# Patient Record
Sex: Female | Born: 1935 | Race: Black or African American | Hispanic: No | Smoking: Never smoker
Health system: Southern US, Community
[De-identification: ages and names within clinical notes are randomized; demographics above are authoritative.]

## PROBLEM LIST (undated history)

## (undated) DIAGNOSIS — H409 Unspecified glaucoma: Secondary | ICD-10-CM

## (undated) DIAGNOSIS — E785 Hyperlipidemia, unspecified: Secondary | ICD-10-CM

## (undated) DIAGNOSIS — D649 Anemia, unspecified: Secondary | ICD-10-CM

## (undated) DIAGNOSIS — I10 Essential (primary) hypertension: Secondary | ICD-10-CM

## (undated) DIAGNOSIS — F0280 Dementia in other diseases classified elsewhere without behavioral disturbance: Secondary | ICD-10-CM

## (undated) DIAGNOSIS — M48061 Spinal stenosis, lumbar region without neurogenic claudication: Secondary | ICD-10-CM

## (undated) DIAGNOSIS — S065XAA Traumatic subdural hemorrhage with loss of consciousness status unknown, initial encounter: Secondary | ICD-10-CM

## (undated) DIAGNOSIS — F329 Major depressive disorder, single episode, unspecified: Secondary | ICD-10-CM

## (undated) DIAGNOSIS — C50911 Malignant neoplasm of unspecified site of right female breast: Secondary | ICD-10-CM

## (undated) DIAGNOSIS — E079 Disorder of thyroid, unspecified: Secondary | ICD-10-CM

## (undated) DIAGNOSIS — E560 Deficiency of vitamin E: Secondary | ICD-10-CM

## (undated) DIAGNOSIS — F32A Depression, unspecified: Secondary | ICD-10-CM

## (undated) DIAGNOSIS — G309 Alzheimer's disease, unspecified: Secondary | ICD-10-CM

## (undated) DIAGNOSIS — M1990 Unspecified osteoarthritis, unspecified site: Secondary | ICD-10-CM

## (undated) DIAGNOSIS — E039 Hypothyroidism, unspecified: Secondary | ICD-10-CM

## (undated) DIAGNOSIS — S065X9A Traumatic subdural hemorrhage with loss of consciousness of unspecified duration, initial encounter: Secondary | ICD-10-CM

---

## 1999-12-02 ENCOUNTER — Ambulatory Visit (HOSPITAL_COMMUNITY): Admission: RE | Admit: 1999-12-02 | Discharge: 1999-12-02 | Payer: Self-pay | Admitting: Internal Medicine

## 1999-12-02 ENCOUNTER — Encounter: Payer: Self-pay | Admitting: Internal Medicine

## 2001-07-28 HISTORY — PX: MASTECTOMY: SHX3

## 2004-11-07 ENCOUNTER — Encounter: Admission: RE | Admit: 2004-11-07 | Discharge: 2004-11-07 | Payer: Self-pay | Admitting: Internal Medicine

## 2006-01-22 ENCOUNTER — Ambulatory Visit: Payer: Self-pay | Admitting: Oncology

## 2006-02-16 ENCOUNTER — Ambulatory Visit (HOSPITAL_COMMUNITY): Admission: RE | Admit: 2006-02-16 | Discharge: 2006-02-16 | Payer: Self-pay | Admitting: Oncology

## 2006-02-18 ENCOUNTER — Encounter: Admission: RE | Admit: 2006-02-18 | Discharge: 2006-02-18 | Payer: Self-pay | Admitting: Unknown Physician Specialty

## 2006-03-11 ENCOUNTER — Ambulatory Visit: Payer: Self-pay | Admitting: Oncology

## 2006-08-05 ENCOUNTER — Ambulatory Visit: Payer: Self-pay | Admitting: Oncology

## 2006-08-10 LAB — COMPREHENSIVE METABOLIC PANEL
ALT: 9 U/L (ref 0–35)
AST: 18 U/L (ref 0–37)
Albumin: 4 g/dL (ref 3.5–5.2)
Alkaline Phosphatase: 54 U/L (ref 39–117)
Calcium: 9.4 mg/dL (ref 8.4–10.5)
Glucose, Bld: 92 mg/dL (ref 70–99)
Potassium: 4 mEq/L (ref 3.5–5.3)
Sodium: 145 mEq/L (ref 135–145)

## 2006-08-10 LAB — CANCER ANTIGEN 27.29: CA 27.29: 15 U/mL (ref 0–39)

## 2006-08-10 LAB — CBC WITH DIFFERENTIAL/PLATELET
HCT: 42.8 % (ref 34.8–46.6)
HGB: 14 g/dL (ref 11.6–15.9)
LYMPH%: 21.3 % (ref 14.0–48.0)
MONO%: 10.3 % (ref 0.0–13.0)
NEUT#: 7 10*3/uL — ABNORMAL HIGH (ref 1.5–6.5)
WBC: 10.6 10*3/uL — ABNORMAL HIGH (ref 3.9–10.0)

## 2006-08-10 LAB — LACTATE DEHYDROGENASE: LDH: 188 U/L (ref 94–250)

## 2006-08-18 ENCOUNTER — Ambulatory Visit (HOSPITAL_COMMUNITY): Admission: RE | Admit: 2006-08-18 | Discharge: 2006-08-18 | Payer: Self-pay | Admitting: Oncology

## 2007-02-22 ENCOUNTER — Encounter: Admission: RE | Admit: 2007-02-22 | Discharge: 2007-02-22 | Payer: Self-pay | Admitting: Oncology

## 2007-02-23 ENCOUNTER — Ambulatory Visit (HOSPITAL_COMMUNITY): Admission: RE | Admit: 2007-02-23 | Discharge: 2007-02-23 | Payer: Self-pay | Admitting: Oncology

## 2007-02-25 ENCOUNTER — Encounter (INDEPENDENT_AMBULATORY_CARE_PROVIDER_SITE_OTHER): Payer: Self-pay | Admitting: Diagnostic Radiology

## 2007-02-25 ENCOUNTER — Encounter: Admission: RE | Admit: 2007-02-25 | Discharge: 2007-02-25 | Payer: Self-pay | Admitting: Oncology

## 2007-08-09 ENCOUNTER — Ambulatory Visit: Payer: Self-pay | Admitting: Oncology

## 2007-09-20 LAB — CBC WITH DIFFERENTIAL/PLATELET
BASO%: 0.9 % (ref 0.0–2.0)
MCHC: 34 g/dL (ref 32.0–36.0)
MONO%: 8.6 % (ref 0.0–13.0)
Platelets: 120 10*3/uL — ABNORMAL LOW (ref 145–400)
RBC: 4.53 10*6/uL (ref 3.70–5.32)
RDW: 14.3 % (ref 11.3–14.5)

## 2007-09-21 LAB — COMPREHENSIVE METABOLIC PANEL
CO2: 26 mEq/L (ref 19–32)
Chloride: 109 mEq/L (ref 96–112)
Creatinine, Ser: 0.65 mg/dL (ref 0.40–1.20)
Glucose, Bld: 101 mg/dL — ABNORMAL HIGH (ref 70–99)
Potassium: 3.9 mEq/L (ref 3.5–5.3)
Sodium: 144 mEq/L (ref 135–145)
Total Bilirubin: 0.6 mg/dL (ref 0.3–1.2)
Total Protein: 7.3 g/dL (ref 6.0–8.3)

## 2007-09-21 LAB — CANCER ANTIGEN 27.29: CA 27.29: 11 U/mL (ref 0–39)

## 2007-09-21 LAB — VITAMIN D 25 HYDROXY (VIT D DEFICIENCY, FRACTURES): Vit D, 25-Hydroxy: 19 ng/mL — ABNORMAL LOW (ref 30–89)

## 2007-09-21 LAB — LACTATE DEHYDROGENASE: LDH: 206 U/L (ref 94–250)

## 2008-09-11 ENCOUNTER — Ambulatory Visit: Payer: Self-pay | Admitting: Oncology

## 2010-08-18 ENCOUNTER — Encounter: Payer: Self-pay | Admitting: Oncology

## 2010-08-18 ENCOUNTER — Encounter: Payer: Self-pay | Admitting: Internal Medicine

## 2010-08-19 ENCOUNTER — Encounter: Payer: Self-pay | Admitting: Oncology

## 2011-05-12 LAB — CREATININE, SERUM
Creatinine, Ser: 0.71
GFR calc non Af Amer: >60

## 2015-01-09 ENCOUNTER — Emergency Department (HOSPITAL_COMMUNITY): Payer: Medicare Other

## 2015-01-09 ENCOUNTER — Encounter (HOSPITAL_COMMUNITY): Payer: Self-pay | Admitting: *Deleted

## 2015-01-09 ENCOUNTER — Inpatient Hospital Stay (HOSPITAL_COMMUNITY)
Admission: EM | Admit: 2015-01-09 | Discharge: 2015-01-16 | DRG: 469 | Disposition: A | Discharge status: Skilled Nursing Facility | Payer: Medicare Other | Attending: Internal Medicine | Admitting: Internal Medicine

## 2015-01-09 DIAGNOSIS — S72009A Fracture of unspecified part of neck of unspecified femur, initial encounter for closed fracture: Secondary | ICD-10-CM | POA: Diagnosis present

## 2015-01-09 DIAGNOSIS — S72002A Fracture of unspecified part of neck of left femur, initial encounter for closed fracture: Secondary | ICD-10-CM | POA: Diagnosis not present

## 2015-01-09 DIAGNOSIS — S72002D Fracture of unspecified part of neck of left femur, subsequent encounter for closed fracture with routine healing: Secondary | ICD-10-CM | POA: Diagnosis not present

## 2015-01-09 DIAGNOSIS — Z66 Do not resuscitate: Secondary | ICD-10-CM | POA: Diagnosis present

## 2015-01-09 DIAGNOSIS — E039 Hypothyroidism, unspecified: Secondary | ICD-10-CM | POA: Diagnosis present

## 2015-01-09 DIAGNOSIS — Z419 Encounter for procedure for purposes other than remedying health state, unspecified: Secondary | ICD-10-CM

## 2015-01-09 DIAGNOSIS — G309 Alzheimer's disease, unspecified: Secondary | ICD-10-CM | POA: Diagnosis present

## 2015-01-09 DIAGNOSIS — M1990 Unspecified osteoarthritis, unspecified site: Secondary | ICD-10-CM | POA: Diagnosis present

## 2015-01-09 DIAGNOSIS — R5383 Other fatigue: Secondary | ICD-10-CM | POA: Diagnosis not present

## 2015-01-09 DIAGNOSIS — F0280 Dementia in other diseases classified elsewhere without behavioral disturbance: Secondary | ICD-10-CM | POA: Diagnosis present

## 2015-01-09 DIAGNOSIS — Z7982 Long term (current) use of aspirin: Secondary | ICD-10-CM | POA: Diagnosis not present

## 2015-01-09 DIAGNOSIS — S72012A Unspecified intracapsular fracture of left femur, initial encounter for closed fracture: Secondary | ICD-10-CM | POA: Diagnosis present

## 2015-01-09 DIAGNOSIS — M25512 Pain in left shoulder: Secondary | ICD-10-CM | POA: Diagnosis present

## 2015-01-09 DIAGNOSIS — R32 Unspecified urinary incontinence: Secondary | ICD-10-CM | POA: Diagnosis present

## 2015-01-09 DIAGNOSIS — W19XXXA Unspecified fall, initial encounter: Secondary | ICD-10-CM | POA: Diagnosis present

## 2015-01-09 DIAGNOSIS — S42302A Unspecified fracture of shaft of humerus, left arm, initial encounter for closed fracture: Principal | ICD-10-CM | POA: Diagnosis present

## 2015-01-09 DIAGNOSIS — I440 Atrioventricular block, first degree: Secondary | ICD-10-CM | POA: Diagnosis present

## 2015-01-09 DIAGNOSIS — D62 Acute posthemorrhagic anemia: Secondary | ICD-10-CM | POA: Insufficient documentation

## 2015-01-09 DIAGNOSIS — I10 Essential (primary) hypertension: Secondary | ICD-10-CM | POA: Diagnosis present

## 2015-01-09 DIAGNOSIS — S72001D Fracture of unspecified part of neck of right femur, subsequent encounter for closed fracture with routine healing: Secondary | ICD-10-CM | POA: Diagnosis not present

## 2015-01-09 DIAGNOSIS — R52 Pain, unspecified: Secondary | ICD-10-CM

## 2015-01-09 DIAGNOSIS — S42309A Unspecified fracture of shaft of humerus, unspecified arm, initial encounter for closed fracture: Secondary | ICD-10-CM | POA: Insufficient documentation

## 2015-01-09 DIAGNOSIS — M25559 Pain in unspecified hip: Secondary | ICD-10-CM

## 2015-01-09 DIAGNOSIS — S42302D Unspecified fracture of shaft of humerus, left arm, subsequent encounter for fracture with routine healing: Secondary | ICD-10-CM | POA: Diagnosis not present

## 2015-01-09 HISTORY — DX: Dementia in other diseases classified elsewhere, unspecified severity, without behavioral disturbance, psychotic disturbance, mood disturbance, and anxiety: F02.80

## 2015-01-09 HISTORY — DX: Essential (primary) hypertension: I10

## 2015-01-09 HISTORY — DX: Hypothyroidism, unspecified: E03.9

## 2015-01-09 HISTORY — DX: Malignant neoplasm of unspecified site of right female breast: C50.911

## 2015-01-09 HISTORY — DX: Unspecified osteoarthritis, unspecified site: M19.90

## 2015-01-09 HISTORY — DX: Disorder of thyroid, unspecified: E07.9

## 2015-01-09 HISTORY — DX: Alzheimer's disease, unspecified: G30.9

## 2015-01-09 LAB — CBC WITH DIFFERENTIAL/PLATELET
Basophils Absolute: 0 10*3/uL (ref 0.0–0.1)
Basophils Relative: 0 % (ref 0–1)
Eosinophils Absolute: 0 10*3/uL (ref 0.0–0.7)
Eosinophils Relative: 0 % (ref 0–5)
HCT: 45.8 % (ref 36.0–46.0)
Hemoglobin: 15.4 g/dL — ABNORMAL HIGH (ref 12.0–15.0)
Lymphocytes Relative: 13 % (ref 12–46)
Lymphs Abs: 1.1 10*3/uL (ref 0.7–4.0)
MCH: 30.9 pg (ref 26.0–34.0)
MCHC: 33.6 g/dL (ref 30.0–36.0)
MCV: 92 fL (ref 78.0–100.0)
Monocytes Absolute: 0.6 10*3/uL (ref 0.1–1.0)
Monocytes Relative: 6 % (ref 3–12)
Neutro Abs: 7.3 10*3/uL (ref 1.7–7.7)
Neutrophils Relative %: 81 % — ABNORMAL HIGH (ref 43–77)
Platelets: 134 10*3/uL — ABNORMAL LOW (ref 150–400)
RBC: 4.98 MIL/uL (ref 3.87–5.11)
RDW: 14.5 % (ref 11.5–15.5)
WBC: 9.1 10*3/uL (ref 4.0–10.5)

## 2015-01-09 LAB — ABO/RH: ABO/RH(D): O POS

## 2015-01-09 LAB — URINALYSIS, ROUTINE W REFLEX MICROSCOPIC
Bilirubin Urine: NEGATIVE
Glucose, UA: NEGATIVE mg/dL
Ketones, ur: 15 mg/dL — AB
Leukocytes, UA: NEGATIVE
Nitrite: NEGATIVE
Protein, ur: NEGATIVE mg/dL
Specific Gravity, Urine: 1.007 (ref 1.005–1.030)
Urobilinogen, UA: 0.2 mg/dL (ref 0.0–1.0)
pH: 7.5 (ref 5.0–8.0)

## 2015-01-09 LAB — BASIC METABOLIC PANEL
Anion gap: 9 (ref 5–15)
BUN: 10 mg/dL (ref 6–20)
CO2: 27 mmol/L (ref 22–32)
Calcium: 9.3 mg/dL (ref 8.9–10.3)
Chloride: 106 mmol/L (ref 101–111)
Creatinine, Ser: 0.52 mg/dL (ref 0.44–1.00)
GFR calc Af Amer: >60 mL/min (ref >60)
GFR calc non Af Amer: >60 mL/min (ref >60)
Glucose, Bld: 126 mg/dL — ABNORMAL HIGH (ref 65–99)
Potassium: 3.8 mmol/L (ref 3.5–5.1)
Sodium: 142 mmol/L (ref 135–145)

## 2015-01-09 LAB — TYPE AND SCREEN
ABO/RH(D): O POS
Antibody Screen: NEGATIVE

## 2015-01-09 LAB — PROTIME-INR
INR: 1.11 (ref 0.00–1.49)
Prothrombin Time: 14.5 seconds (ref 11.6–15.2)

## 2015-01-09 LAB — URINE MICROSCOPIC-ADD ON

## 2015-01-09 LAB — TSH: TSH: 2.005 u[IU]/mL (ref 0.350–4.500)

## 2015-01-09 LAB — BRAIN NATRIURETIC PEPTIDE: B Natriuretic Peptide: 251.9 pg/mL — ABNORMAL HIGH (ref 0.0–100.0)

## 2015-01-09 LAB — T4, FREE: FREE T4: 0.95 ng/dL (ref 0.61–1.12)

## 2015-01-09 MED ORDER — ESCITALOPRAM OXALATE 10 MG PO TABS
10.0000 mg | ORAL_TABLET | Freq: Every day | ORAL | Status: DC
  Administered 2015-01-14 – 2015-01-16 (×3): 10 mg via ORAL
  Filled 2015-01-09 (×7): qty 1

## 2015-01-09 MED ORDER — FENTANYL CITRATE (PF) 100 MCG/2ML IJ SOLN
50.0000 ug | INTRAMUSCULAR | Status: DC | PRN
  Administered 2015-01-09 (×2): 50 ug via INTRAVENOUS
  Filled 2015-01-09 (×2): qty 2

## 2015-01-09 MED ORDER — HYDROCODONE-ACETAMINOPHEN 5-325 MG PO TABS: 1.0000 | ORAL_TABLET | Freq: Four times a day (QID) | ORAL | Status: DC | PRN

## 2015-01-09 MED ORDER — TIMOLOL HEMIHYDRATE 0.5 % OP SOLN: 1.0000 [drp] | Freq: Every day | OPHTHALMIC | Status: DC

## 2015-01-09 MED ORDER — MORPHINE SULFATE 2 MG/ML IJ SOLN
0.5000 mg | INTRAMUSCULAR | Status: DC | PRN
  Administered 2015-01-09 – 2015-01-10 (×2): 0.5 mg via INTRAVENOUS
  Filled 2015-01-09 (×2): qty 1

## 2015-01-09 MED ORDER — LEVOTHYROXINE SODIUM 50 MCG PO TABS
50.0000 ug | ORAL_TABLET | Freq: Every day | ORAL | Status: DC
  Administered 2015-01-13 – 2015-01-16 (×4): 50 ug via ORAL
  Filled 2015-01-09 (×8): qty 1

## 2015-01-09 MED ORDER — HYDRALAZINE HCL 20 MG/ML IJ SOLN
5.0000 mg | Freq: Four times a day (QID) | INTRAMUSCULAR | Status: DC | PRN
  Administered 2015-01-14: 5 mg via INTRAVENOUS
  Filled 2015-01-09: qty 1

## 2015-01-09 MED ORDER — POLYETHYLENE GLYCOL 3350 17 G PO PACK
17.0000 g | PACK | Freq: Every day | ORAL | Status: DC | PRN
  Filled 2015-01-09: qty 1

## 2015-01-09 MED ORDER — TIMOLOL MALEATE 0.5 % OP SOLN
1.0000 [drp] | Freq: Every day | OPHTHALMIC | Status: DC
  Administered 2015-01-09 – 2015-01-16 (×7): 1 [drp] via OPHTHALMIC
  Filled 2015-01-09 (×3): qty 5

## 2015-01-09 MED ORDER — FUROSEMIDE 10 MG/ML IJ SOLN
20.0000 mg | Freq: Once | INTRAMUSCULAR | Status: AC
  Administered 2015-01-09: 20 mg via INTRAVENOUS
  Filled 2015-01-09: qty 2

## 2015-01-09 MED ORDER — LISINOPRIL 10 MG PO TABS
10.0000 mg | ORAL_TABLET | Freq: Every day | ORAL | Status: DC
  Administered 2015-01-14 – 2015-01-16 (×3): 10 mg via ORAL
  Filled 2015-01-09 (×7): qty 1

## 2015-01-09 MED ORDER — CETYLPYRIDINIUM CHLORIDE 0.05 % MT LIQD
7.0000 mL | Freq: Two times a day (BID) | OROMUCOSAL | Status: DC
  Administered 2015-01-09 – 2015-01-16 (×10): 7 mL via OROMUCOSAL

## 2015-01-09 MED ORDER — ATORVASTATIN CALCIUM 10 MG PO TABS
20.0000 mg | ORAL_TABLET | Freq: Every day | ORAL | Status: DC
Start: 2015-01-09 — End: 2015-01-16
  Administered 2015-01-13 – 2015-01-15 (×3): 20 mg via ORAL
  Filled 2015-01-09: qty 2
  Filled 2015-01-09 (×2): qty 1
  Filled 2015-01-09: qty 2
  Filled 2015-01-09 (×4): qty 1

## 2015-01-09 MED ORDER — FENTANYL CITRATE (PF) 100 MCG/2ML IJ SOLN
50.0000 ug | INTRAMUSCULAR | Status: AC | PRN
  Administered 2015-01-09 (×2): 50 ug via INTRAVENOUS
  Filled 2015-01-09 (×2): qty 2

## 2015-01-09 MED ORDER — ONDANSETRON HCL 4 MG/2ML IJ SOLN
4.0000 mg | Freq: Once | INTRAMUSCULAR | Status: AC
  Administered 2015-01-09: 4 mg via INTRAVENOUS
  Filled 2015-01-09: qty 2

## 2015-01-09 MED ORDER — FENTANYL CITRATE (PF) 100 MCG/2ML IJ SOLN
25.0000 ug | Freq: Once | INTRAMUSCULAR | Status: AC
Start: 2015-01-09 — End: 2015-01-09
  Administered 2015-01-09: 25 ug via INTRAVENOUS
  Filled 2015-01-09: qty 2

## 2015-01-09 NOTE — Progress Notes (Signed)
Orthopedic Tech Progress Note Patient Details:  Amber Fowler [birth year] [id number] Patient is unable to use ohf Patient ID: Amber Fowler, female   DOB: 1918-08-18, 81 y.o.   MRN: [id number]   Braulio Bosch 01/09/2015, 8:29 PM

## 2015-01-09 NOTE — H&P (Signed)
History and Physical  Amber Fowler YKD:[id number] DOB: [birth year] DOA: 01/09/2015  Referring physician: Okey Regal, ER PA PCP: No primary care provider on file. patient previously from out of town and recently brought to [facility] by her son  Chief Complaint: Fall and pain  HPI: Amber Fowler is a 81 y.o. female  With past medical history of hypothyroidism, hypertension and Alzheimer's disease who was previously from out of town and has been recently moved up here to [place] under the care of her son who is also a Engineer, drilling.  Patient was brought in from independent living by EMS after she was found by staff. Patient had an unwitnessed fall and was down for unknown length of time. Patient was found to have some twisting of her left arm as well as inward rotation and shortening of her left leg. She herself has intermittent confusion and is not able to relate events of her fall. Patient was given medication for pain. X-rays done noted a left humerus fracture and femur fracture. Orthopedics was consulted. Labs were done which were unremarkable although patient's blood pressure was noted to be markedly elevated in the 200s. It was reported she did take her blood pressure medicine this morning. Hospitalists were called for further evaluation and admission.   Review of Systems:  Patient seen after arrival to floor. Currently she is somnolent after receiving medication for pain. I'm unable to get any kind of review of systems from her..  Past Medical History  Diagnosis Date  . Hypertension   . Thyroid disease   . Hypothyroid   . Osteoarthritis   . Cancer of right breast   . Alzheimer disease     /notes 01/09/2015   Past Surgical History  Procedure Laterality Date  . Mastectomy Right 2003    [name] 10/01/2010   Social History:  reports that she does not drink alcohol or use illicit drugs. Her tobacco history is not on file. unable to clarify from patient Patient lives at an  independent living facility and is able to mostly participate in activities of daily living although it's been reported she didn't having increased falls as of late  No Known Allergies  Family history: Due to to patient's somnolence, unable to obtain  Prior to Admission medications   Medication Sig Start Date End Date Taking? Authorizing Provider  aspirin 325 MG tablet Take 325 mg by mouth daily after lunch.   Yes Historical Provider, MD  atorvastatin (LIPITOR) 20 MG tablet Take 20 mg by mouth at bedtime.   Yes Historical Provider, MD  cholecalciferol (VITAMIN D) 1000 UNITS tablet Take 1,000 Units by mouth at bedtime.   Yes Historical Provider, MD  escitalopram (LEXAPRO) 10 MG tablet Take 10 mg by mouth daily after lunch.   Yes Historical Provider, MD  levothyroxine (SYNTHROID, LEVOTHROID) 50 MCG tablet Take 50 mcg by mouth every morning.   Yes Historical Provider, MD  lisinopril (PRINIVIL,ZESTRIL) 10 MG tablet Take 10 mg by mouth daily after lunch.   Yes Historical Provider, MD  Omega-3 Fatty Acids (FISH OIL) 1000 MG CAPS Take 1,000 mg by mouth at bedtime.   Yes Historical Provider, MD  timolol (BETIMOL) 0.5 % ophthalmic solution Place 1 drop into both eyes daily after lunch.   Yes Historical Provider, MD    Physical Exam: BP 218/79 mmHg  Pulse 61  Temp(Src) 98.6 F (37 C) (Oral)  Resp 18  Ht   Wt 51.3 kg (113 lb 1.5 oz)  SpO2 100%  General:  Somnolent, no acute distress Eyes: Sclerae appear nonicteric ENT: Normocephalic, atraumatic, mucous membranes are dry Neck: No JVD Cardiovascular: Regular rate and rhythm, O9-G2, 2/6 systolic ejection murmur Respiratory: Clear to auscultation bilaterally Abdomen: Soft, nontender, nondistended, positive bowel sounds Skin: No skin breaks, tears or lesions Musculoskeletal: No clubbing or cyanosis, trace edema Psychiatric: Patient is appropriate, no evidence of psychoses Neurologic: No focal deficits           Labs on Admission:  Basic  Metabolic Panel:  Recent Labs Lab 01/09/15 1218  NA 142  K 3.8  CL 106  CO2 27  GLUCOSE 126*  BUN 10  CREATININE 0.52  CALCIUM 9.3   Liver Function Tests: No results for input(s): AST, ALT, ALKPHOS, BILITOT, PROT, ALBUMIN in the last 168 hours. No results for input(s): LIPASE, AMYLASE in the last 168 hours. No results for input(s): AMMONIA in the last 168 hours. CBC:  Recent Labs Lab 01/09/15 1218  WBC 9.1  NEUTROABS 7.3  HGB 15.4*  HCT 45.8  MCV 92.0  PLT 134*   Cardiac Enzymes: No results for input(s): CKTOTAL, CKMB, CKMBINDEX, TROPONINI in the last 168 hours.  BNP (last 3 results) No results for input(s): BNP in the last 8760 hours.  ProBNP (last 3 results) No results for input(s): PROBNP in the last 8760 hours.  CBG: No results for input(s): GLUCAP in the last 168 hours.  Radiological Exams on Admission: Dg Chest 1 View  01/09/2015   CLINICAL DATA:  Pain following fall  EXAM: CHEST  1 VIEW  COMPARISON:  None.  FINDINGS: There is no edema or consolidation. Heart is slightly enlarged with pulmonary vascularity within normal limits. There is atherosclerotic change in the aorta. No adenopathy. Patient is status post right mastectomy. There are surgical clips the right axillary region. Bones are somewhat osteoporotic. No pneumothorax. No rib fracture evident. There is arthropathy in each shoulder.  IMPRESSION: Mild cardiac enlargement.  No edema or consolidation.   Electronically Signed   By: Lowella Grip III M.D.   On: 01/09/2015 13:52   Ct Head Wo Contrast  01/09/2015   CLINICAL DATA:  Unwitnessed fall.  Pain.  EXAM: CT HEAD WITHOUT CONTRAST  CT CERVICAL SPINE WITHOUT CONTRAST  TECHNIQUE: Multidetector CT imaging of the head and cervical spine was performed following the standard protocol without intravenous contrast. Multiplanar CT image reconstructions of the cervical spine were also generated.  COMPARISON:  None.  FINDINGS: CT HEAD FINDINGS  There is no acute  intracranial hemorrhage, acute infarction, or worrisome intracranial mass lesion. The patient does have a 15 mm calcified meningioma on the inner toe mobile the left side of the frontal bone. There is no edema in the adjacent brain tissue.  The patient has benign basal ganglia calcifications as well as white matter calcification in the left frontal lobe. Diffuse cerebral cortical and cerebellar atrophy, most prominent in the frontal and temporal lobes. No acute osseous abnormality.  CT CERVICAL SPINE FINDINGS  Congenital incomplete posterior arch of C1. No fracture or acute subluxation. No prevertebral soft tissue swelling. Severe degenerative disc disease at C3-4 and C4-5. Auto fusion of the C5 and C6 vertebra. Impending auto fusion at C6-7.  Moderately severe facet arthritis at C2-3 and C3-4 on the left. Less severe facet arthritis from C2-3 through T2-3 on the right.  2 mm anterolisthesis of C7 on T1. Disc space narrowing at T1-2 and T2-3.  IMPRESSION: 1. No acute intracranial abnormality. Atrophy. Calcified meningioma on the left frontal bone. 2.  No acute abnormality of the cervical spine. Multilevel degenerative disc and joint disease.   Electronically Signed   By: Lorriane Shire M.D.   On: 01/09/2015 13:46   Ct Cervical Spine Wo Contrast  01/09/2015   CLINICAL DATA:  Unwitnessed fall.  Pain.  EXAM: CT HEAD WITHOUT CONTRAST  CT CERVICAL SPINE WITHOUT CONTRAST  TECHNIQUE: Multidetector CT imaging of the head and cervical spine was performed following the standard protocol without intravenous contrast. Multiplanar CT image reconstructions of the cervical spine were also generated.  COMPARISON:  None.  FINDINGS: CT HEAD FINDINGS  There is no acute intracranial hemorrhage, acute infarction, or worrisome intracranial mass lesion. The patient does have a 15 mm calcified meningioma on the inner toe mobile the left side of the frontal bone. There is no edema in the adjacent brain tissue.  The patient has benign  basal ganglia calcifications as well as white matter calcification in the left frontal lobe. Diffuse cerebral cortical and cerebellar atrophy, most prominent in the frontal and temporal lobes. No acute osseous abnormality.  CT CERVICAL SPINE FINDINGS  Congenital incomplete posterior arch of C1. No fracture or acute subluxation. No prevertebral soft tissue swelling. Severe degenerative disc disease at C3-4 and C4-5. Auto fusion of the C5 and C6 vertebra. Impending auto fusion at C6-7.  Moderately severe facet arthritis at C2-3 and C3-4 on the left. Less severe facet arthritis from C2-3 through T2-3 on the right.  2 mm anterolisthesis of C7 on T1. Disc space narrowing at T1-2 and T2-3.  IMPRESSION: 1. No acute intracranial abnormality. Atrophy. Calcified meningioma on the left frontal bone. 2. No acute abnormality of the cervical spine. Multilevel degenerative disc and joint disease.   Electronically Signed   By: Lorriane Shire M.D.   On: 01/09/2015 13:46   Dg Shoulder Left  01/09/2015   CLINICAL DATA:  Unwitnessed fall, LEFT arm deformity  EXAM: LEFT SHOULDER - 2+ VIEW  COMPARISON:  None  FINDINGS: Marked osseous demineralization.  Mid diaphyseal fracture LEFT humerus with apex medial angulation, medial displacement, and overriding.  Advanced LEFT glenohumeral degenerative changes and probable LEFT chronic rotator cuff tear.  Level alignment inadequately assessed on this exam.  Visualized ribs intact.  No glenohumeral dislocation or AC joint malalignment identified.  IMPRESSION: Displaced angulated and overriding mid diaphyseal fracture LEFT humerus.  Degenerative changes LEFT shoulder.  Marked osseous demineralization.   Electronically Signed   By: Lavonia Dana M.D.   On: 01/09/2015 13:55   Dg Hip Unilat With Pelvis 2-3 Views Left  01/09/2015   CLINICAL DATA:  Unwitnessed fall with left leg deformity  EXAM: LEFT HIP (WITH PELVIS) 2-3 VIEWS  COMPARISON:  None.  FINDINGS: Acute left femoral neck fracture,  subcapital. There is displacement with upward migration of the distal femur. No dislocation. No notable degenerative changes to the left acetabulum. No evidence of pelvic ring fracture.  1.2 cm ovoid sclerotic focus in the intertrochanteric right femur. Noted history of breast cancer, but morphology favors a bone island.  IMPRESSION: 1. Displaced subcapital left femoral neck fracture. 2. Sclerotic focus in the proximal right femur favors a bone island.   Electronically Signed   By: Monte Fantasia M.D.   On: 01/09/2015 13:57   Dg Femur Min 2 Views Left  01/09/2015   CLINICAL DATA:  Pain following fall  EXAM: LEFT FEMUR 2 VIEWS  COMPARISON:  None.  FINDINGS: Frontal and lateral views were obtained. There is a subcapital femoral neck fracture on the left with  varus angulation at the fracture site. There is mild impaction of the inferior femoral neck along the femoral head in the region of the fracture. No other fracture. No dislocation. There is moderate osteoarthritic change in the hip and knee joints. There is moderate arterial vascular calcification. No apparent knee joint effusion.  IMPRESSION: Subcapital femoral neck fracture on the left with varus angulation at the fracture site and mild impaction.   Electronically Signed   By: Lowella Grip III M.D.   On: 01/09/2015 13:53    EKG: Independently reviewed. Normal sinus rhythm with PVCs, prolonged PR 423, borderline prolonged QT, probable LVH  Assessment/Plan Present on Admission:  . Hypertension: Markedly elevated. Patient's son did confirm that she did take her lisinopril this morning. Even with pain, systolic staying in the 606T. Son states that she's had some difficulty with control of her blood pressure. I suspect she may have some volume overload. Of order BNP and given one dose of Lasix. If she has a large amount of volume output and/or BNP elevated, which echocardiogram. Patient does not have a previous diagnosis of congestive heart failure.    . Hypothyroid: Continue Synthroid. Discussed with son who had gotten recent TSH which noted mild elevation. Recheck TSH plus free T3/T4.  . Alzheimer disease: For now try to minimize pain medications and other things that can increase confusion. We'll leave her as nothing by mouth although will start diet if the plan is for no surgery tomorrow. Concerned about high aspiration risk.  . Left displaced femoral neck fracture: Left message with orthopedic surgery, for medical standpoint she is cleared although will work on improving her blood pressure. She may have some underlying undiagnosed cardiac disease, but with no previously diagnosed heart disease and high mortality given fracture and advanced age, she would be a even more risk if she did not undergo surgery soon as possible. Prolonged PR interval on EKG: Repeat in the morning  Consultants: Orthopedics, Dr. Marlou Sa  Code Status: DO NOT RESUSCITATE as confirmed by son  Family Communication: Spoke with son by phone   Disposition Plan: Inpatient then likely will need skilled nursing  Time spent: 40 minutes  [facility] Pager [phone]

## 2015-01-09 NOTE — ED Notes (Signed)
Spoke with Sophronia Simas tech re: sling vs. Splint placement

## 2015-01-09 NOTE — ED Notes (Signed)
Accompanied pt to CT & Xray, pt c/o nausea, Hedges, PA notified, pt rcvd Zofran 4mg  & 25 mcg Fentanyl while in Radiology

## 2015-01-09 NOTE — ED Notes (Signed)
Attempted report 

## 2015-01-09 NOTE — Progress Notes (Signed)
Pt seen and examined - left hip and left humerus fracture Will discuss with her son Dr Carlis Abbott No surgery tonight Full consult to follow

## 2015-01-09 NOTE — ED Notes (Signed)
Son updated on pt status & speaking with Hedges, PA on the phone

## 2015-01-09 NOTE — Progress Notes (Signed)
Orthopedic Tech Progress Note Patient Details:  Amber Fowler June 16, 1919 [id number]  Ortho Devices Type of Ortho Device: Arm sling Ortho Device/Splint Location: LUE Ortho Device/Splint Interventions: Ordered, Application   Braulio Bosch 01/09/2015, 4:17 PM

## 2015-01-09 NOTE — Progress Notes (Signed)
Orthopedic Tech Progress Note Patient Details:  Amber Fowler Nov 21, 1918 [id number] Pt. unable to use OHF with trapeze due to upper extremity fx. Patient ID: [name], female   DOB: 1918/10/18, 81 y.o.   MRN: [id number]   Darrol Poke 01/09/2015, 8:51 PM

## 2015-01-09 NOTE — Progress Notes (Signed)
Notified MD that patient has arrived to nursing unit and BP was elevated.

## 2015-01-09 NOTE — ED Notes (Signed)
Pt in from [facility] independent living via [facility], per report pt had unwitnessed fall, unknown LOC, pt reported to have L arm obvious deformity, pt has inward rotation & shortening of L leg, pt hx of Alz, pt at neuro baseline-alert to self, pt follows commands, speaks in complete sentences, pt has intermittent confusion at baseline, pt has L arm immobilizer in place, pt did not tolerate c collar, neck secured with towel & tape

## 2015-01-09 NOTE — ED Provider Notes (Signed)
CSN: [id number]     Arrival date & time 01/09/15  1137 History   First MD Initiated Contact with Patient 01/09/15 1145     Chief Complaint  Patient presents with  . Fall  . Hip Pain  . Arm Pain    HPI   81 year old female presenting status post fall today. He was brought to the ED by EMS after she was found from an unwitnessed fall, unknown loss of consciousness. EMS reports left arm obvious deformity, external rotation and shortening of the left extremity. Patient has a history Alzheimer's, nursing staff reports that she is at her neurological baseline, able to follow commands, speaks in complete sentences. Upon evaluation patient was incontinent on the bed, she was alert and oriented to being in the hospital and the event. She reports that she remembers the fall, but is uncertain what caused it. She reports left shoulder and arm pain and left hip pain at the time of evaluation. When asked where she hurts the most she reports "everywhere". Nursing staff reports that they spoke with the son who is her power of attorney.   Past Medical History  Diagnosis Date  . Hypertension   . Thyroid disease   . Hypothyroid   . Osteoarthritis    History reviewed. No pertinent past surgical history. No family history on file. History  Substance Use Topics  . Smoking status: Not on file  . Smokeless tobacco: Not on file  . Alcohol Use: No   OB History    No data available     Review of Systems  All other systems reviewed and are negative.   Allergies  Review of patient's allergies indicates no known allergies.  Home Medications   Prior to Admission medications   Medication Sig Start Date End Date Taking? Authorizing Provider  aspirin 325 MG tablet Take 325 mg by mouth daily after lunch.   Yes Historical Provider, MD  atorvastatin (LIPITOR) 20 MG tablet Take 20 mg by mouth at bedtime.   Yes Historical Provider, MD  cholecalciferol (VITAMIN D) 1000 UNITS tablet Take 1,000 Units by mouth at  bedtime.   Yes Historical Provider, MD  escitalopram (LEXAPRO) 10 MG tablet Take 10 mg by mouth daily after lunch.   Yes Historical Provider, MD  levothyroxine (SYNTHROID, LEVOTHROID) 50 MCG tablet Take 50 mcg by mouth every morning.   Yes Historical Provider, MD  lisinopril (PRINIVIL,ZESTRIL) 10 MG tablet Take 10 mg by mouth daily after lunch.   Yes Historical Provider, MD  Omega-3 Fatty Acids (FISH OIL) 1000 MG CAPS Take 1,000 mg by mouth at bedtime.   Yes Historical Provider, MD  timolol (BETIMOL) 0.5 % ophthalmic solution Place 1 drop into both eyes daily after lunch.   Yes Historical Provider, MD   BP 205/70 mmHg  Pulse 57  Temp(Src) 97.7 F (36.5 C) (Oral)  Resp 19  SpO2 98% Physical Exam  Constitutional: She is oriented to person, place, and time. She appears well-developed and well-nourished.  HENT:  Head: Normocephalic and atraumatic.  Eyes: Conjunctivae are normal. Pupils are equal, round, and reactive to light. Right eye exhibits no discharge. Left eye exhibits no discharge. No scleral icterus.  Neck: Normal range of motion. No JVD present. No tracheal deviation present.  Pulmonary/Chest: Effort normal. No stridor.  Musculoskeletal:  Obvious deformity of the left humerus, radial pulse strong, sensation, grip strength intact distally.  Left hip painful to palpation, no signs of soft tissue injury. Left extremity shortened and externally rotated  C-spine  is mildly tender to palpation. T and L-spine nontender to palpation, no pain with AP lateral chest compression. Left hip nontender palpation left extremity nontender right extremity nontender, abdomen nontender  Neurological: She is alert and oriented to person, place, and time. Coordination normal.  Psychiatric: She has a normal mood and affect. Her behavior is normal. Judgment and thought content normal.  Nursing note and vitals reviewed.   ED Course  Procedures (including critical care time) Labs Review Labs Reviewed   BASIC METABOLIC PANEL - Abnormal; Notable for the following:    Glucose, Bld 126 (*)    All other components within normal limits  CBC WITH DIFFERENTIAL/PLATELET - Abnormal; Notable for the following:    Hemoglobin 15.4 (*)    Platelets 134 (*)    Neutrophils Relative % 81 (*)    All other components within normal limits  URINALYSIS, ROUTINE W REFLEX MICROSCOPIC (NOT AT [facility]) - Abnormal; Notable for the following:    Color, Urine STRAW (*)    Hgb urine dipstick SMALL (*)    Ketones, ur 15 (*)    All other components within normal limits  PROTIME-INR  URINE MICROSCOPIC-ADD ON  BRAIN NATRIURETIC PEPTIDE  TYPE AND SCREEN  ABO/RH    Imaging Review Dg Chest 1 View  01/09/2015   CLINICAL DATA:  Pain following fall  EXAM: CHEST  1 VIEW  COMPARISON:  None.  FINDINGS: There is no edema or consolidation. Heart is slightly enlarged with pulmonary vascularity within normal limits. There is atherosclerotic change in the aorta. No adenopathy. Patient is status post right mastectomy. There are surgical clips the right axillary region. Bones are somewhat osteoporotic. No pneumothorax. No rib fracture evident. There is arthropathy in each shoulder.  IMPRESSION: Mild cardiac enlargement.  No edema or consolidation.   Electronically Signed   By: Lowella Grip III M.D.   On: 01/09/2015 13:52   Ct Head Wo Contrast  01/09/2015   CLINICAL DATA:  Unwitnessed fall.  Pain.  EXAM: CT HEAD WITHOUT CONTRAST  CT CERVICAL SPINE WITHOUT CONTRAST  TECHNIQUE: Multidetector CT imaging of the head and cervical spine was performed following the standard protocol without intravenous contrast. Multiplanar CT image reconstructions of the cervical spine were also generated.  COMPARISON:  None.  FINDINGS: CT HEAD FINDINGS  There is no acute intracranial hemorrhage, acute infarction, or worrisome intracranial mass lesion. The patient does have a 15 mm calcified meningioma on the inner toe mobile the left side of the frontal  bone. There is no edema in the adjacent brain tissue.  The patient has benign basal ganglia calcifications as well as white matter calcification in the left frontal lobe. Diffuse cerebral cortical and cerebellar atrophy, most prominent in the frontal and temporal lobes. No acute osseous abnormality.  CT CERVICAL SPINE FINDINGS  Congenital incomplete posterior arch of C1. No fracture or acute subluxation. No prevertebral soft tissue swelling. Severe degenerative disc disease at C3-4 and C4-5. Auto fusion of the C5 and C6 vertebra. Impending auto fusion at C6-7.  Moderately severe facet arthritis at C2-3 and C3-4 on the left. Less severe facet arthritis from C2-3 through T2-3 on the right.  2 mm anterolisthesis of C7 on T1. Disc space narrowing at T1-2 and T2-3.  IMPRESSION: 1. No acute intracranial abnormality. Atrophy. Calcified meningioma on the left frontal bone. 2. No acute abnormality of the cervical spine. Multilevel degenerative disc and joint disease.   Electronically Signed   By: Lorriane Shire M.D.   On: 01/09/2015 13:46  Ct Cervical Spine Wo Contrast  01/09/2015   CLINICAL DATA:  Unwitnessed fall.  Pain.  EXAM: CT HEAD WITHOUT CONTRAST  CT CERVICAL SPINE WITHOUT CONTRAST  TECHNIQUE: Multidetector CT imaging of the head and cervical spine was performed following the standard protocol without intravenous contrast. Multiplanar CT image reconstructions of the cervical spine were also generated.  COMPARISON:  None.  FINDINGS: CT HEAD FINDINGS  There is no acute intracranial hemorrhage, acute infarction, or worrisome intracranial mass lesion. The patient does have a 15 mm calcified meningioma on the inner toe mobile the left side of the frontal bone. There is no edema in the adjacent brain tissue.  The patient has benign basal ganglia calcifications as well as white matter calcification in the left frontal lobe. Diffuse cerebral cortical and cerebellar atrophy, most prominent in the frontal and temporal  lobes. No acute osseous abnormality.  CT CERVICAL SPINE FINDINGS  Congenital incomplete posterior arch of C1. No fracture or acute subluxation. No prevertebral soft tissue swelling. Severe degenerative disc disease at C3-4 and C4-5. Auto fusion of the C5 and C6 vertebra. Impending auto fusion at C6-7.  Moderately severe facet arthritis at C2-3 and C3-4 on the left. Less severe facet arthritis from C2-3 through T2-3 on the right.  2 mm anterolisthesis of C7 on T1. Disc space narrowing at T1-2 and T2-3.  IMPRESSION: 1. No acute intracranial abnormality. Atrophy. Calcified meningioma on the left frontal bone. 2. No acute abnormality of the cervical spine. Multilevel degenerative disc and joint disease.   Electronically Signed   By: Lorriane Shire M.D.   On: 01/09/2015 13:46   Dg Shoulder Left  01/09/2015   CLINICAL DATA:  Unwitnessed fall, LEFT arm deformity  EXAM: LEFT SHOULDER - 2+ VIEW  COMPARISON:  None  FINDINGS: Marked osseous demineralization.  Mid diaphyseal fracture LEFT humerus with apex medial angulation, medial displacement, and overriding.  Advanced LEFT glenohumeral degenerative changes and probable LEFT chronic rotator cuff tear.  Level alignment inadequately assessed on this exam.  Visualized ribs intact.  No glenohumeral dislocation or AC joint malalignment identified.  IMPRESSION: Displaced angulated and overriding mid diaphyseal fracture LEFT humerus.  Degenerative changes LEFT shoulder.  Marked osseous demineralization.   Electronically Signed   By: Lavonia Dana M.D.   On: 01/09/2015 13:55   Dg Hip Unilat With Pelvis 2-3 Views Left  01/09/2015   CLINICAL DATA:  Unwitnessed fall with left leg deformity  EXAM: LEFT HIP (WITH PELVIS) 2-3 VIEWS  COMPARISON:  None.  FINDINGS: Acute left femoral neck fracture, subcapital. There is displacement with upward migration of the distal femur. No dislocation. No notable degenerative changes to the left acetabulum. No evidence of pelvic ring fracture.  1.2 cm  ovoid sclerotic focus in the intertrochanteric right femur. Noted history of breast cancer, but morphology favors a bone island.  IMPRESSION: 1. Displaced subcapital left femoral neck fracture. 2. Sclerotic focus in the proximal right femur favors a bone island.   Electronically Signed   By: Monte Fantasia M.D.   On: 01/09/2015 13:57   Dg Femur Min 2 Views Left  01/09/2015   CLINICAL DATA:  Pain following fall  EXAM: LEFT FEMUR 2 VIEWS  COMPARISON:  None.  FINDINGS: Frontal and lateral views were obtained. There is a subcapital femoral neck fracture on the left with varus angulation at the fracture site. There is mild impaction of the inferior femoral neck along the femoral head in the region of the fracture. No other fracture. No dislocation. There  is moderate osteoarthritic change in the hip and knee joints. There is moderate arterial vascular calcification. No apparent knee joint effusion.  IMPRESSION: Subcapital femoral neck fracture on the left with varus angulation at the fracture site and mild impaction.   Electronically Signed   By: Lowella Grip III M.D.   On: 01/09/2015 13:53     EKG Interpretation   Date/Time:  Tuesday January 09 2015 12:02:34 EDT Ventricular Rate:  62 PR Interval:  263 QRS Duration: 73 QT Interval:  488 QTC Calculation: 496 R Axis:   67 Text Interpretation:  Sinus rhythm Ventricular premature complex Prolonged  PR interval Probable LVH with secondary repol abnrm Borderline prolonged  QT interval No old tracing to compare Confirmed by GOLDSTON  MD, SCOTT  ([id number]) on 01/09/2015 12:19:57 PM      MDM   Final diagnoses:  Pain  Humerus fracture, left, closed, initial encounter  Femoral neck fracture, left, closed, initial encounter    Labs: BMP, CBC, PT/INR,, urinalysis  Imaging: DG hip unilateral displaced Left femoral neck fracture, DG shoulder left arm displaced angulated overriding mid diaphyseal fracture left humerus CT head no significant  findings  Consults: Orthopedics, hospitalist  Therapeutics: Fentanyl, Zofran  Assessment: Femur fracture, humerus fracture  Plan: She presents with a femur fracture and he missed fracture, and hypertension. He was treated here in the ED with fentanyl, orthopedic surgery was consult at requested her to be admitted for them to evaluate for potential surgery. Patient was admitted to the hospitalist service for further evaluation and management. Patient's son was present towards the end of her evaluation, he was informed of all relevant information, he agreed to our plan today and had no further questions concerns at the time of transfer. Patient's blood pressure was elevated in 200s, trending down with increased pain control. Patient's son reports that she's had increasing falls over the last few months, these resulted in no injuries, reports she is normally incontinent, believes that she may been attempting to get to the bathroom when this fall happened.      Okey Regal, PA-C 01/09/15 [place], MD 01/10/15 [phone]

## 2015-01-09 NOTE — Consult Note (Signed)
Reason for Consult: Left hip and left arm pain Referring Physician: Dr. Edmonia Fowler is an 81 y.o. female.  HPI: Amber Fowler is a 81 year old ambulatory patient who lives and habits would independent living who does get around with a walker. She fell today. This was described as mechanical fall without loss of consciousness or syncope. She describes left hip and left arm pain. She denies any other orthopedic complaints. Patient does have dementia but is otherwise healthy. She does have some hypertension.  Past Medical History  Diagnosis Date  . Hypertension   . Thyroid disease   . Hypothyroid   . Osteoarthritis   . Cancer of right breast   . Alzheimer disease     /notes 01/09/2015    Past Surgical History  Procedure Laterality Date  . Mastectomy Right 2003    Amber Fowler 10/01/2010    No family history on file.  Social History:  reports that she does not drink alcohol or use illicit drugs. Her tobacco history is not on file.  Allergies: No Known Allergies  Medications: I have reviewed the patient's current medications.  Results for orders placed or performed during the hospital encounter of 01/09/15 (from the past 48 hour(s))  Urinalysis, Routine w reflex microscopic (not at [facility])     Status: Abnormal   Collection Time: 01/09/15 11:50 AM  Result Value Ref Range   Color, Urine STRAW (A) YELLOW   APPearance CLEAR CLEAR   Specific Gravity, Urine 1.007 1.005 - 1.030   pH 7.5 5.0 - 8.0   Glucose, UA NEGATIVE NEGATIVE mg/dL   Hgb urine dipstick SMALL (A) NEGATIVE   Bilirubin Urine NEGATIVE NEGATIVE   Ketones, ur 15 (A) NEGATIVE mg/dL   Protein, ur NEGATIVE NEGATIVE mg/dL   Urobilinogen, UA 0.2 0.0 - 1.0 mg/dL   Nitrite NEGATIVE NEGATIVE   Leukocytes, UA NEGATIVE NEGATIVE  Urine microscopic-add on     Status: None   Collection Time: 01/09/15 11:50 AM  Result Value Ref Range   Squamous Epithelial / LPF RARE RARE   RBC / HPF 3-6 <3 RBC/hpf   Bacteria, UA RARE RARE  Basic  metabolic panel     Status: Abnormal   Collection Time: 01/09/15 12:18 PM  Result Value Ref Range   Sodium 142 135 - 145 mmol/L   Potassium 3.8 3.5 - 5.1 mmol/L   Chloride 106 101 - 111 mmol/L   CO2 27 22 - 32 mmol/L   Glucose, Bld 126 (H) 65 - 99 mg/dL   BUN 10 6 - 20 mg/dL   Creatinine, Ser 0.52 0.44 - 1.00 mg/dL   Calcium 9.3 8.9 - 10.3 mg/dL   GFR calc non Af Amer >60 >60 mL/min   GFR calc Af Amer >60 >60 mL/min    Comment: (NOTE) The eGFR has been calculated using the CKD EPI equation. This calculation has not been validated in all clinical situations. eGFR's persistently <60 mL/min signify possible Chronic Kidney Disease.    Anion gap 9 5 - 15  CBC WITH DIFFERENTIAL     Status: Abnormal   Collection Time: 01/09/15 12:18 PM  Result Value Ref Range   WBC 9.1 4.0 - 10.5 K/uL   RBC 4.98 3.87 - 5.11 MIL/uL   Hemoglobin 15.4 (H) 12.0 - 15.0 g/dL   HCT 45.8 36.0 - 46.0 %   MCV 92.0 78.0 - 100.0 fL   MCH 30.9 26.0 - 34.0 pg   MCHC 33.6 30.0 - 36.0 g/dL   RDW 14.5 11.5 -  15.5 %   Platelets 134 (L) 150 - 400 K/uL   Neutrophils Relative % 81 (H) 43 - 77 %   Neutro Abs 7.3 1.7 - 7.7 K/uL   Lymphocytes Relative 13 12 - 46 %   Lymphs Abs 1.1 0.7 - 4.0 K/uL   Monocytes Relative 6 3 - 12 %   Monocytes Absolute 0.6 0.1 - 1.0 K/uL   Eosinophils Relative 0 0 - 5 %   Eosinophils Absolute 0.0 0.0 - 0.7 K/uL   Basophils Relative 0 0 - 1 %   Basophils Absolute 0.0 0.0 - 0.1 K/uL  Protime-INR     Status: None   Collection Time: 01/09/15 12:18 PM  Result Value Ref Range   Prothrombin Time 14.5 11.6 - 15.2 seconds   INR 1.11 0.00 - 1.49  Type and screen     Status: None   Collection Time: 01/09/15 12:18 PM  Result Value Ref Range   ABO/RH(D) O POS    Antibody Screen NEG    Sample Expiration 01/12/2015   ABO/Rh     Status: None   Collection Time: 01/09/15 12:18 PM  Result Value Ref Range   ABO/RH(D) O POS   Brain natriuretic peptide     Status: Abnormal   Collection Time:  01/09/15 12:18 PM  Result Value Ref Range   B Natriuretic Peptide 251.9 (H) 0.0 - 100.0 pg/mL  TSH     Status: None   Collection Time: 01/09/15  9:02 PM  Result Value Ref Range   TSH 2.005 0.350 - 4.500 uIU/mL  T4, free     Status: None   Collection Time: 01/09/15  9:02 PM  Result Value Ref Range   Free T4 0.95 0.61 - 1.12 ng/dL    Dg Chest 1 View  01/09/2015   CLINICAL DATA:  Pain following fall  EXAM: CHEST  1 VIEW  COMPARISON:  None.  FINDINGS: There is no edema or consolidation. Heart is slightly enlarged with pulmonary vascularity within normal limits. There is atherosclerotic change in the aorta. No adenopathy. Patient is status post right mastectomy. There are surgical clips the right axillary region. Bones are somewhat osteoporotic. No pneumothorax. No rib fracture evident. There is arthropathy in each shoulder.  IMPRESSION: Mild cardiac enlargement.  No edema or consolidation.   Electronically Signed   By: Lowella Grip III M.D.   On: 01/09/2015 13:52   Ct Head Wo Contrast  01/09/2015   CLINICAL DATA:  Unwitnessed fall.  Pain.  EXAM: CT HEAD WITHOUT CONTRAST  CT CERVICAL SPINE WITHOUT CONTRAST  TECHNIQUE: Multidetector CT imaging of the head and cervical spine was performed following the standard protocol without intravenous contrast. Multiplanar CT image reconstructions of the cervical spine were also generated.  COMPARISON:  None.  FINDINGS: CT HEAD FINDINGS  There is no acute intracranial hemorrhage, acute infarction, or worrisome intracranial mass lesion. The patient does have a 15 mm calcified meningioma on the inner toe mobile the left side of the frontal bone. There is no edema in the adjacent brain tissue.  The patient has benign basal ganglia calcifications as well as white matter calcification in the left frontal lobe. Diffuse cerebral cortical and cerebellar atrophy, most prominent in the frontal and temporal lobes. No acute osseous abnormality.  CT CERVICAL SPINE FINDINGS   Congenital incomplete posterior arch of C1. No fracture or acute subluxation. No prevertebral soft tissue swelling. Severe degenerative disc disease at C3-4 and C4-5. Auto fusion of the C5 and C6 vertebra. Impending  auto fusion at C6-7.  Moderately severe facet arthritis at C2-3 and C3-4 on the left. Less severe facet arthritis from C2-3 through T2-3 on the right.  2 mm anterolisthesis of C7 on T1. Disc space narrowing at T1-2 and T2-3.  IMPRESSION: 1. No acute intracranial abnormality. Atrophy. Calcified meningioma on the left frontal bone. 2. No acute abnormality of the cervical spine. Multilevel degenerative disc and joint disease.   Electronically Signed   By: Lorriane Shire M.D.   On: 01/09/2015 13:46   Ct Cervical Spine Wo Contrast  01/09/2015   CLINICAL DATA:  Unwitnessed fall.  Pain.  EXAM: CT HEAD WITHOUT CONTRAST  CT CERVICAL SPINE WITHOUT CONTRAST  TECHNIQUE: Multidetector CT imaging of the head and cervical spine was performed following the standard protocol without intravenous contrast. Multiplanar CT image reconstructions of the cervical spine were also generated.  COMPARISON:  None.  FINDINGS: CT HEAD FINDINGS  There is no acute intracranial hemorrhage, acute infarction, or worrisome intracranial mass lesion. The patient does have a 15 mm calcified meningioma on the inner toe mobile the left side of the frontal bone. There is no edema in the adjacent brain tissue.  The patient has benign basal ganglia calcifications as well as white matter calcification in the left frontal lobe. Diffuse cerebral cortical and cerebellar atrophy, most prominent in the frontal and temporal lobes. No acute osseous abnormality.  CT CERVICAL SPINE FINDINGS  Congenital incomplete posterior arch of C1. No fracture or acute subluxation. No prevertebral soft tissue swelling. Severe degenerative disc disease at C3-4 and C4-5. Auto fusion of the C5 and C6 vertebra. Impending auto fusion at C6-7.  Moderately severe facet  arthritis at C2-3 and C3-4 on the left. Less severe facet arthritis from C2-3 through T2-3 on the right.  2 mm anterolisthesis of C7 on T1. Disc space narrowing at T1-2 and T2-3.  IMPRESSION: 1. No acute intracranial abnormality. Atrophy. Calcified meningioma on the left frontal bone. 2. No acute abnormality of the cervical spine. Multilevel degenerative disc and joint disease.   Electronically Signed   By: Lorriane Shire M.D.   On: 01/09/2015 13:46   Dg Shoulder Left  01/09/2015   CLINICAL DATA:  Unwitnessed fall, LEFT arm deformity  EXAM: LEFT SHOULDER - 2+ VIEW  COMPARISON:  None  FINDINGS: Marked osseous demineralization.  Mid diaphyseal fracture LEFT humerus with apex medial angulation, medial displacement, and overriding.  Advanced LEFT glenohumeral degenerative changes and probable LEFT chronic rotator cuff tear.  Level alignment inadequately assessed on this exam.  Visualized ribs intact.  No glenohumeral dislocation or AC joint malalignment identified.  IMPRESSION: Displaced angulated and overriding mid diaphyseal fracture LEFT humerus.  Degenerative changes LEFT shoulder.  Marked osseous demineralization.   Electronically Signed   By: Lavonia Dana M.D.   On: 01/09/2015 13:55   Dg Hip Unilat With Pelvis 2-3 Views Left  01/09/2015   CLINICAL DATA:  Unwitnessed fall with left leg deformity  EXAM: LEFT HIP (WITH PELVIS) 2-3 VIEWS  COMPARISON:  None.  FINDINGS: Acute left femoral neck fracture, subcapital. There is displacement with upward migration of the distal femur. No dislocation. No notable degenerative changes to the left acetabulum. No evidence of pelvic ring fracture.  1.2 cm ovoid sclerotic focus in the intertrochanteric right femur. Noted history of breast cancer, but morphology favors a bone island.  IMPRESSION: 1. Displaced subcapital left femoral neck fracture. 2. Sclerotic focus in the proximal right femur favors a bone island.   Electronically Signed  By: Monte Fantasia M.D.   On:  01/09/2015 13:57   Dg Femur Min 2 Views Left  01/09/2015   CLINICAL DATA:  Pain following fall  EXAM: LEFT FEMUR 2 VIEWS  COMPARISON:  None.  FINDINGS: Frontal and lateral views were obtained. There is a subcapital femoral neck fracture on the left with varus angulation at the fracture site. There is mild impaction of the inferior femoral neck along the femoral head in the region of the fracture. No other fracture. No dislocation. There is moderate osteoarthritic change in the hip and knee joints. There is moderate arterial vascular calcification. No apparent knee joint effusion.  IMPRESSION: Subcapital femoral neck fracture on the left with varus angulation at the fracture site and mild impaction.   Electronically Signed   By: Lowella Grip III M.D.   On: 01/09/2015 13:53    Review of Systems  Unable to perform ROS  Blood pressure 141/58, pulse 68, temperature 97.9 F (36.6 C), temperature source Oral, resp. rate 18, weight 51.3 kg (113 lb 1.5 oz), SpO2 100 %. Physical Exam  Constitutional: She appears well-developed.  HENT:  Head: Normocephalic.  Eyes: Conjunctivae are normal.  Neck: Normal range of motion.  Cardiovascular: Normal rate.   Respiratory: Effort normal.  Neurological: She is alert.  Skin: Skin is warm.  Psychiatric: She has a normal mood and affect.   patient is alert but not oriented. Right upper extremity is examined. She has a little bit of coarseness with passive range of motion of the right shoulder consistent with rotator cuff arthropathy. Elbow wrist range of motion on the right is intact. She has good grip strength on the right-hand side and palpable radial pulse. In terms of following directions she will really demonstrate much in terms of radial nerve function on the right. On the right lower extremity she has no groin pain with internal extra rotation of the leg she has palpable pedal pulses intact ankle dorsiflexion plantarflexion strength appears to have intact  sensation on the dorsum plantar aspect of the foot left lower Ebony Hail he demonstrates no bruising ecchymosis or swelling in the knee or ankle region. She has a little shortened and externally rotated on the left-hand side. She does have pain in the left groin which shows she localizes. Left upper extremity is examined. Swelling is present proximally radial pulses intact again I cannot get her to demonstrate EPL radial nerve function. Elbow wrist on the left have no  crepitus or coarseness or bruising or swelling.  Assessment/Plan: Impression is subcapital femoral neck fracture on the left 2 part midshaft humeral fracture on the left plan Situation for Issabela. She is ambulatory and is otherwise in good health. Discussed her case with Dr. Jeanann Lewandowsky who is her son. At this time because of her overall good health and good functional ability in independent living at [place] the decision is to proceed with operative fixation of her fractures. On the left arm that'll involve anterior approach with plating. Rate although she does not have a definite diagnosis of osteoporosis her bones do appear to be osteoporotic at least on plain radiographs. The risk and benefits of the operative fixation of the left humerus include nonunion malunion potential for more surgery as well as potential for nerve damage. Difficult to assess whether or not her radial nerve is functioning as she does not demonstrate radial nerve function in either hand. In regards to the left hip clinic left hip hemiarthroplasty is best suited for [facility]. The risk  of that procedure include leg length inequality dislocation nerve vessel damage potential need for more surgery if there is settling of the prosthesis. Plan for surgical intervention tomorrow.  DEAN,GREGORY SCOTT 01/09/2015, 11:33 PM

## 2015-01-09 NOTE — ED Notes (Signed)
Pts son updated on pts status & current plan of care, pts son is POA & requests to be contacted @ [phone] with updates

## 2015-01-10 ENCOUNTER — Inpatient Hospital Stay (HOSPITAL_COMMUNITY): Payer: Medicare Other | Admitting: Certified Registered"

## 2015-01-10 ENCOUNTER — Encounter (HOSPITAL_COMMUNITY): Payer: Self-pay | Admitting: Certified Registered"

## 2015-01-10 ENCOUNTER — Encounter (HOSPITAL_COMMUNITY)
Admission: EM | Disposition: A | Discharge status: Skilled Nursing Facility | Payer: Self-pay | Source: Home / Self Care | Attending: Internal Medicine

## 2015-01-10 ENCOUNTER — Inpatient Hospital Stay (HOSPITAL_COMMUNITY): Payer: Medicare Other

## 2015-01-10 DIAGNOSIS — S42415A Nondisplaced simple supracondylar fracture without intercondylar fracture of left humerus, initial encounter for closed fracture: Secondary | ICD-10-CM

## 2015-01-10 DIAGNOSIS — S72002A Fracture of unspecified part of neck of left femur, initial encounter for closed fracture: Secondary | ICD-10-CM

## 2015-01-10 DIAGNOSIS — G309 Alzheimer's disease, unspecified: Secondary | ICD-10-CM

## 2015-01-10 DIAGNOSIS — S72009A Fracture of unspecified part of neck of unspecified femur, initial encounter for closed fracture: Secondary | ICD-10-CM | POA: Diagnosis present

## 2015-01-10 DIAGNOSIS — I10 Essential (primary) hypertension: Secondary | ICD-10-CM

## 2015-01-10 DIAGNOSIS — E039 Hypothyroidism, unspecified: Secondary | ICD-10-CM

## 2015-01-10 HISTORY — PX: ORIF HUMERUS FRACTURE: SHX2126

## 2015-01-10 HISTORY — PX: HIP ARTHROPLASTY: SHX981

## 2015-01-10 LAB — BASIC METABOLIC PANEL
Anion gap: 11 (ref 5–15)
BUN: 17 mg/dL (ref 6–20)
CHLORIDE: 105 mmol/L (ref 101–111)
CO2: 25 mmol/L (ref 22–32)
Calcium: 8.5 mg/dL — ABNORMAL LOW (ref 8.9–10.3)
Creatinine, Ser: 0.78 mg/dL (ref 0.44–1.00)
GFR calc Af Amer: >60 mL/min (ref >60)
GFR calc non Af Amer: >60 mL/min (ref >60)
Glucose, Bld: 121 mg/dL — ABNORMAL HIGH (ref 65–99)
POTASSIUM: 3.9 mmol/L (ref 3.5–5.1)
Sodium: 141 mmol/L (ref 135–145)

## 2015-01-10 LAB — CBC
HCT: 36.2 % (ref 36.0–46.0)
Hemoglobin: 12.1 g/dL (ref 12.0–15.0)
MCH: 30.9 pg (ref 26.0–34.0)
MCHC: 33.4 g/dL (ref 30.0–36.0)
MCV: 92.3 fL (ref 78.0–100.0)
Platelets: 132 10*3/uL — ABNORMAL LOW (ref 150–400)
RBC: 3.92 MIL/uL (ref 3.87–5.11)
RDW: 14.7 % (ref 11.5–15.5)
WBC: 14.3 10*3/uL — ABNORMAL HIGH (ref 4.0–10.5)

## 2015-01-10 LAB — SURGICAL PCR SCREEN
MRSA, PCR: NEGATIVE
STAPHYLOCOCCUS AUREUS: NEGATIVE

## 2015-01-10 SURGERY — HEMIARTHROPLASTY, HIP, DIRECT ANTERIOR APPROACH, FOR FRACTURE
Anesthesia: General | Site: Hip | Laterality: Left

## 2015-01-10 MED ORDER — FENTANYL CITRATE (PF) 100 MCG/2ML IJ SOLN
INTRAMUSCULAR | Status: DC | PRN
Start: 2015-01-10 — End: 2015-01-10
  Administered 2015-01-10 (×2): 25 ug via INTRAVENOUS
  Administered 2015-01-10: 50 ug via INTRAVENOUS
  Administered 2015-01-10: 25 ug via INTRAVENOUS

## 2015-01-10 MED ORDER — CEFAZOLIN SODIUM-DEXTROSE 2-3 GM-% IV SOLR
INTRAVENOUS | Status: DC | PRN
  Administered 2015-01-10: 2 g via INTRAVENOUS

## 2015-01-10 MED ORDER — EPHEDRINE SULFATE 50 MG/ML IJ SOLN
INTRAMUSCULAR | Status: DC | PRN
  Administered 2015-01-10 (×4): 5 mg via INTRAVENOUS

## 2015-01-10 MED ORDER — CEFAZOLIN SODIUM-DEXTROSE 2-3 GM-% IV SOLR
INTRAVENOUS | Status: AC
  Filled 2015-01-10: qty 50

## 2015-01-10 MED ORDER — ONDANSETRON HCL 4 MG/2ML IJ SOLN
INTRAMUSCULAR | Status: AC
  Filled 2015-01-10: qty 2

## 2015-01-10 MED ORDER — PROPOFOL 10 MG/ML IV BOLUS
INTRAVENOUS | Status: DC | PRN
  Administered 2015-01-10: 50 mg via INTRAVENOUS

## 2015-01-10 MED ORDER — ROCURONIUM BROMIDE 50 MG/5ML IV SOLN
INTRAVENOUS | Status: AC
  Filled 2015-01-10: qty 1

## 2015-01-10 MED ORDER — 0.9 % SODIUM CHLORIDE (POUR BTL) OPTIME
TOPICAL | Status: DC | PRN
  Administered 2015-01-10 (×7): 1000 mL

## 2015-01-10 MED ORDER — FENTANYL CITRATE (PF) 250 MCG/5ML IJ SOLN
INTRAMUSCULAR | Status: AC
  Filled 2015-01-10: qty 5

## 2015-01-10 MED ORDER — PHENYLEPHRINE 40 MCG/ML (10ML) SYRINGE FOR IV PUSH (FOR BLOOD PRESSURE SUPPORT)
PREFILLED_SYRINGE | INTRAVENOUS | Status: AC
  Filled 2015-01-10: qty 10

## 2015-01-10 MED ORDER — POTASSIUM CHLORIDE IN NACL 20-0.9 MEQ/L-% IV SOLN
INTRAVENOUS | Status: DC
  Administered 2015-01-11 – 2015-01-12 (×3): via INTRAVENOUS
  Filled 2015-01-10 (×4): qty 1000

## 2015-01-10 MED ORDER — GLYCOPYRROLATE 0.2 MG/ML IJ SOLN
INTRAMUSCULAR | Status: DC | PRN
  Administered 2015-01-10: 0.4 mg via INTRAVENOUS

## 2015-01-10 MED ORDER — ROCURONIUM BROMIDE 100 MG/10ML IV SOLN
INTRAVENOUS | Status: DC | PRN
  Administered 2015-01-10: 20 mg via INTRAVENOUS
  Administered 2015-01-10 (×5): 10 mg via INTRAVENOUS

## 2015-01-10 MED ORDER — LACTATED RINGERS IV SOLN
INTRAVENOUS | Status: DC
  Administered 2015-01-10 (×3): via INTRAVENOUS

## 2015-01-10 MED ORDER — DEXAMETHASONE SODIUM PHOSPHATE 10 MG/ML IJ SOLN
INTRAMUSCULAR | Status: AC
  Filled 2015-01-10: qty 1

## 2015-01-10 MED ORDER — PROMETHAZINE HCL 25 MG/ML IJ SOLN: 6.2500 mg | INTRAMUSCULAR | Status: DC | PRN

## 2015-01-10 MED ORDER — DEXTROSE 5 % IV SOLN
10.0000 mg | INTRAVENOUS | Status: DC | PRN
  Administered 2015-01-10: 40 ug/min via INTRAVENOUS

## 2015-01-10 MED ORDER — LIDOCAINE HCL (CARDIAC) 20 MG/ML IV SOLN
INTRAVENOUS | Status: AC
Start: 2015-01-10 — End: 2015-01-10
  Filled 2015-01-10: qty 5

## 2015-01-10 MED ORDER — LIDOCAINE HCL (CARDIAC) 20 MG/ML IV SOLN
INTRAVENOUS | Status: DC | PRN
  Administered 2015-01-10: 60 mg via INTRAVENOUS

## 2015-01-10 MED ORDER — PROPOFOL 10 MG/ML IV BOLUS
INTRAVENOUS | Status: AC
  Filled 2015-01-10: qty 20

## 2015-01-10 MED ORDER — PHENYLEPHRINE HCL 10 MG/ML IJ SOLN
INTRAMUSCULAR | Status: DC | PRN
  Administered 2015-01-10: 200 ug via INTRAVENOUS

## 2015-01-10 MED ORDER — FENTANYL CITRATE (PF) 100 MCG/2ML IJ SOLN: 25.0000 ug | INTRAMUSCULAR | Status: DC | PRN

## 2015-01-10 MED ORDER — SODIUM CHLORIDE 0.9 % IJ SOLN
INTRAMUSCULAR | Status: AC
  Filled 2015-01-10: qty 10

## 2015-01-10 MED ORDER — EPHEDRINE SULFATE 50 MG/ML IJ SOLN
INTRAMUSCULAR | Status: AC
  Filled 2015-01-10: qty 1

## 2015-01-10 MED ORDER — NEOSTIGMINE METHYLSULFATE 10 MG/10ML IV SOLN
INTRAVENOUS | Status: DC | PRN
  Administered 2015-01-10: 3 mg via INTRAVENOUS

## 2015-01-10 MED ORDER — MIDAZOLAM HCL 2 MG/2ML IJ SOLN
INTRAMUSCULAR | Status: AC
  Filled 2015-01-10: qty 2

## 2015-01-10 MED ORDER — CEFAZOLIN SODIUM-DEXTROSE 2-3 GM-% IV SOLR
2.0000 g | Freq: Four times a day (QID) | INTRAVENOUS | Status: AC
  Administered 2015-01-11 (×2): 2 g via INTRAVENOUS
  Filled 2015-01-10 (×2): qty 50

## 2015-01-10 SURGICAL SUPPLY — 104 items
BANDAGE ELASTIC 4 VELCRO ST LF (GAUZE/BANDAGES/DRESSINGS) IMPLANT
BANDAGE ELASTIC 6 VELCRO ST LF (GAUZE/BANDAGES/DRESSINGS) IMPLANT
BENZOIN TINCTURE PRP APPL 2/3 (GAUZE/BANDAGES/DRESSINGS) IMPLANT
BIT DRILL 2.5X2.75 QC CALB (BIT) ×4 IMPLANT
BIT DRILL 3.2 QC DISP (BIT) ×4 IMPLANT
BIT DRILL 3.5X5.5 QC CALB (BIT) ×4 IMPLANT
BLADE SAW RECIP 87.9 MT (BLADE) ×4 IMPLANT
BLADE SAW SAG 73X25 THK (BLADE) ×2
BLADE SAW SGTL 73X25 THK (BLADE) ×2 IMPLANT
BLADE SURG ROTATE 9660 (MISCELLANEOUS) IMPLANT
BNDG COHESIVE 4X5 TAN STRL (GAUZE/BANDAGES/DRESSINGS) IMPLANT
BRUSH FEMORAL CANAL (MISCELLANEOUS) IMPLANT
CANISTER SUCTION WELLS/JOHNSON (MISCELLANEOUS) ×8 IMPLANT
CAPT HIP HEMI 2 ×4 IMPLANT
COVER BACK TABLE 24X17X13 BIG (DRAPES) IMPLANT
COVER SURGICAL LIGHT HANDLE (MISCELLANEOUS) ×8 IMPLANT
DRAIN PENROSE 1/2X12 LTX STRL (WOUND CARE) IMPLANT
DRAPE C-ARM 42X72 X-RAY (DRAPES) ×4 IMPLANT
DRAPE IMP U-DRAPE 54X76 (DRAPES) ×12 IMPLANT
DRAPE INCISE IOBAN 66X45 STRL (DRAPES) ×8 IMPLANT
DRAPE ORTHO SPLIT 77X108 STRL (DRAPES) ×10
DRAPE PROXIMA HALF (DRAPES) ×4 IMPLANT
DRAPE SURG 17X23 STRL (DRAPES) IMPLANT
DRAPE SURG ORHT 6 SPLT 77X108 (DRAPES) ×10 IMPLANT
DRAPE U-SHAPE 47X51 STRL (DRAPES) ×8 IMPLANT
DRILL BIT 1/8DIAX5INL DISPOSE (BIT) ×4 IMPLANT
DRSG AQUACEL AG ADV 3.5X10 (GAUZE/BANDAGES/DRESSINGS) ×4 IMPLANT
DRSG AQUACEL AG ADV 3.5X14 (GAUZE/BANDAGES/DRESSINGS) ×4 IMPLANT
DRSG PAD ABDOMINAL 8X10 ST (GAUZE/BANDAGES/DRESSINGS) IMPLANT
DURAPREP 26ML APPLICATOR (WOUND CARE) ×8 IMPLANT
ELECT BLADE 6.5 EXT (BLADE) IMPLANT
ELECT CAUTERY BLADE 6.4 (BLADE) ×4 IMPLANT
ELECT REM PT RETURN 9FT ADLT (ELECTROSURGICAL) ×8
ELECTRODE REM PT RTRN 9FT ADLT (ELECTROSURGICAL) ×4 IMPLANT
EVACUATOR 1/8 PVC DRAIN (DRAIN) IMPLANT
FACESHIELD WRAPAROUND (MASK) ×8 IMPLANT
GAUZE SPONGE 4X4 12PLY STRL (GAUZE/BANDAGES/DRESSINGS) IMPLANT
GAUZE XEROFORM 5X9 LF (GAUZE/BANDAGES/DRESSINGS) IMPLANT
GLOVE BIO SURGEON ST LM GN SZ9 (GLOVE) IMPLANT
GLOVE BIO SURGEON STRL SZ7 (GLOVE) ×4 IMPLANT
GLOVE BIOGEL PI IND STRL 8 (GLOVE) ×8 IMPLANT
GLOVE BIOGEL PI INDICATOR 8 (GLOVE) ×8
GLOVE SURG ORTHO 8.0 STRL STRW (GLOVE) ×32 IMPLANT
GLOVE SURG SS PI 7.0 STRL IVOR (GLOVE) ×12 IMPLANT
GOWN STRL REUS W/ TWL LRG LVL3 (GOWN DISPOSABLE) ×2 IMPLANT
GOWN STRL REUS W/ TWL XL LVL3 (GOWN DISPOSABLE) ×12 IMPLANT
GOWN STRL REUS W/TWL LRG LVL3 (GOWN DISPOSABLE) ×2
GOWN STRL REUS W/TWL XL LVL3 (GOWN DISPOSABLE) ×12
HANDPIECE INTERPULSE COAX TIP (DISPOSABLE)
HOOD PEEL AWAY FACE SHEILD DIS (HOOD) IMPLANT
IMMOBILIZER KNEE 20 (SOFTGOODS) IMPLANT
IMMOBILIZER KNEE 22 UNIV (SOFTGOODS) ×4 IMPLANT
IMMOBILIZER KNEE 24 THIGH 36 (MISCELLANEOUS) IMPLANT
IMMOBILIZER KNEE 24 UNIV (MISCELLANEOUS)
KIT BASIN OR (CUSTOM PROCEDURE TRAY) ×8 IMPLANT
KIT ROOM TURNOVER OR (KITS) ×4 IMPLANT
MANIFOLD NEPTUNE II (INSTRUMENTS) ×4 IMPLANT
NDL SUT .5 MAYO 1.404X.05X (NEEDLE) ×2 IMPLANT
NEEDLE 21X1 OR PACK (NEEDLE) IMPLANT
NEEDLE HYPO 25GX1X1/2 BEV (NEEDLE) IMPLANT
NEEDLE MAYO TAPER (NEEDLE) ×2
NS IRRIG 1000ML POUR BTL (IV SOLUTION) ×20 IMPLANT
PACK SHOULDER (CUSTOM PROCEDURE TRAY) ×4 IMPLANT
PACK TOTAL JOINT (CUSTOM PROCEDURE TRAY) ×4 IMPLANT
PACK UNIVERSAL I (CUSTOM PROCEDURE TRAY) ×4 IMPLANT
PAD ARMBOARD 7.5X6 YLW CONV (MISCELLANEOUS) ×8 IMPLANT
PAD CAST 4YDX4 CTTN HI CHSV (CAST SUPPLIES) IMPLANT
PADDING CAST COTTON 4X4 STRL (CAST SUPPLIES)
PASSER SUT SWANSON 36MM LOOP (INSTRUMENTS) ×4 IMPLANT
PENCIL BUTTON HOLSTER BLD 10FT (ELECTRODE) IMPLANT
PILLOW ABDUCTION HIP (SOFTGOODS) IMPLANT
PIN STEINMAN 3/16 (PIN) IMPLANT
PLATE LOCK COMP 9H (Plate) ×4 IMPLANT
SCREW CORT 3.5X26 815037026 (Screw) ×4 IMPLANT
SCREW CORTICAL 3.5MM  28MM (Screw) ×2 IMPLANT
SCREW CORTICAL 3.5MM 28MM (Screw) ×2 IMPLANT
SCREW NLOCK CORT 4.5X22 (Screw) ×8 IMPLANT
SCREW NLOCK CORT 4.5X24 (Screw) ×4 IMPLANT
SCREW NLOCK CORT 4.5X26 (Screw) ×16 IMPLANT
SCREW NLOCK CORT 4.5X28 (Screw) ×12 IMPLANT
SET HNDPC FAN SPRY TIP SCT (DISPOSABLE) IMPLANT
SPONGE LAP 18X18 X RAY DECT (DISPOSABLE) ×8 IMPLANT
SPONGE LAP 4X18 X RAY DECT (DISPOSABLE) IMPLANT
STAPLER VISISTAT 35W (STAPLE) ×12 IMPLANT
STOCKINETTE IMPERVIOUS 9X36 MD (GAUZE/BANDAGES/DRESSINGS) ×4 IMPLANT
SUCTION FRAZIER TIP 10 FR DISP (SUCTIONS) ×4 IMPLANT
SUT ETHIBOND NAB CT1 #1 30IN (SUTURE) ×16 IMPLANT
SUT VIC AB 0 CT1 27 (SUTURE) ×10
SUT VIC AB 0 CT1 27XBRD ANBCTR (SUTURE) ×10 IMPLANT
SUT VIC AB 1 CT1 27 (SUTURE) ×8
SUT VIC AB 1 CT1 27XBRD ANBCTR (SUTURE) ×8 IMPLANT
SUT VIC AB 2-0 CT1 27 (SUTURE) ×8
SUT VIC AB 2-0 CT1 TAPERPNT 27 (SUTURE) ×8 IMPLANT
SUT VIC AB 2-0 CTB1 (SUTURE) IMPLANT
SYR CONTROL 10ML LL (SYRINGE) IMPLANT
TOWEL OR 17X24 6PK STRL BLUE (TOWEL DISPOSABLE) ×8 IMPLANT
TOWEL OR 17X26 10 PK STRL BLUE (TOWEL DISPOSABLE) ×12 IMPLANT
TOWER CARTRIDGE SMART MIX (DISPOSABLE) IMPLANT
TRAY FOLEY CATH 16FRSI W/METER (SET/KITS/TRAYS/PACK) IMPLANT
TUBE CONNECTING 12'X1/4 (SUCTIONS) ×2
TUBE CONNECTING 12X1/4 (SUCTIONS) ×6 IMPLANT
WASHER CUP TIMAX (Trauma) ×4 IMPLANT
WATER STERILE IRR 1000ML POUR (IV SOLUTION) ×4 IMPLANT
YANKAUER SUCT BULB TIP NO VENT (SUCTIONS) ×4 IMPLANT

## 2015-01-10 NOTE — Anesthesia Procedure Notes (Signed)
Procedure Name: Intubation Date/Time: 01/10/2015 5:58 PM Performed by: Izora Gala Pre-anesthesia Checklist: Patient identified, Emergency Drugs available, Suction available and Patient being monitored Patient Re-evaluated:Patient Re-evaluated prior to inductionOxygen Delivery Method: Circle system utilized Preoxygenation: Pre-oxygenation with 100% oxygen Intubation Type: IV induction Ventilation: Mask ventilation without difficulty Laryngoscope Size: Miller and 3 Grade View: Grade I Tube type: Oral Tube size: 7.0 mm Number of attempts: 1 Placement Confirmation: ETT inserted through vocal cords under direct vision,  positive ETCO2 and breath sounds checked- equal and bilateral Secured at: 1 cm Tube secured with: Tape

## 2015-01-10 NOTE — Transfer of Care (Signed)
Immediate Anesthesia Transfer of Care Note  Patient: Amber Fowler  Procedure(s) Performed: Procedure(s): ARTHROPLASTY BIPOLAR HIP (HEMIARTHROPLASTY) (Left) OPEN REDUCTION INTERNAL FIXATION (ORIF) HUMERAL SHAFT FRACTURE (Left)  Patient Location: PACU  Anesthesia Type:General  Level of Consciousness: responds to stimulation  Airway & Oxygen Therapy: Patient Spontanous Breathing and Patient connected to face mask oxygen  Post-op Assessment: Report given to RN and Post -op Vital signs reviewed and stable  Post vital signs: Reviewed and stable  Last Vitals:  Filed Vitals:   01/10/15 1601  BP: 136/57  Pulse: 69  Temp: 36.7 C  Resp: 18    Complications: No apparent anesthesia complications

## 2015-01-10 NOTE — Progress Notes (Signed)
Pt arrived from 5W on 1L O2 via nasal cannula, pulse ox 100%. Turned O2 off, pulse ox 95-97% on room air, will monitor pulse ox continuously. Respirations even and unlabored.

## 2015-01-10 NOTE — Progress Notes (Signed)
Orthopedic Tech Progress Note Patient Details:  Amber Fowler Dec 23, 1918 [id number]  Patient ID: Amber Fowler, female   DOB: [birth year], 81 y.o.   MRN: [id number] Pt unable to use trapeze bar patient helper  Hildred Priest 01/10/2015, 6:23 AM

## 2015-01-10 NOTE — Brief Op Note (Signed)
01/09/2015 - 01/10/2015  9:58 PM  PATIENT:  Amber Fowler  81 y.o. female  PRE-OPERATIVE DIAGNOSIS:  Left Hip Fx. Left Humeral Fx  POST-OPERATIVE DIAGNOSIS:  Left Hip Fx. Left Humeral Fx  PROCEDURE:  Procedure(s): ARTHROPLASTY BIPOLAR HIP (HEMIARTHROPLASTY) OPEN REDUCTION INTERNAL FIXATION (ORIF) HUMERAL SHAFT FRACTURE  SURGEON:  Surgeon(s): [name], MD  ASSISTANT: gill clark pa  ANESTHESIA:   general  EBL: 200 ml    Total I/O In: 2000 [I.V.:2000] Out: 235 [Urine:85; Blood:150]  BLOOD ADMINISTERED: none  DRAINS: none   LOCAL MEDICATIONS USED:  none  SPECIMEN:  No Specimen  COUNTS:  YES  TOURNIQUET:  * No tourniquets in log *  DICTATION: .Other Dictation: Dictation Number [id number]  PLAN OF CARE: Admit to inpatient   PATIENT DISPOSITION:  PACU - hemodynamically stable

## 2015-01-10 NOTE — Progress Notes (Signed)
[facility] PROGRESS NOTE  Amber Fowler ZOX:[id number] DOB: [birth year] DOA: 01/09/2015 PCP: No primary care provider on file.  Assessment/Plan: 1-left hip fracture and left humerus fracture: secondary to mechanical fall -patient is moderate to high risk for surgery but not further workup needed for optimization -she was very functional prior to admission at baseline and living in independent setting -per orthopedic service plan is for hemiarthroplasty and fixation of humerus fracture -will follow clinical response and continue supportive care  2-HTN: patient BP severely elevated on admission; pain most likely contributing -continue lisinopril -might need further antihypertensive adjustment and low sodium diet  3-hypothyroidism -TSH and free T4 WNL -continue synthroid  4-leukocytosis -appears to be secondary to demargination -holding abx's for now and will follow trend  5-Alzheimer disease: mild according to family reports on admission and very functional -will monitor closely for sundowning -minimize narcotics -continue supportive care     Code Status: DNR Family Communication: no family at bedside Disposition Plan: to be determine; but most likely will require SNF for rehab   Consultants:  Dr. Marlou Sa (orthopedic service)  Procedures:  Plan is for hemiarthroplasty and left humerus fx fixation  Antibiotics:  None   HPI/Subjective: Afebrile, oriented X 2; no CP, no SOB. Complaining of pain in her left arm and leg due to fractures.  Objective: Filed Vitals:   01/10/15 1601  BP: 136/57  Pulse: 69  Temp: 98 F (36.7 C)  Resp: 18    Intake/Output Summary (Last 24 hours) at 01/10/15 1722 Last data filed at 01/10/15 0704  Gross per 24 hour  Intake      0 ml  Output    925 ml  Net   -925 ml   Filed Weights   01/09/15 1749 01/10/15 0526  Weight: 51.3 kg (113 lb 1.5 oz) 51.8 kg (114 lb 3.2 oz)    Exam:   General:  Afebrile, no CP, no SOB.  Patient reports pain with movement in her left arm and left leg  Cardiovascular: S1 and S2, no rubs or gallops  Respiratory: CTA bilaterally, no rales or wheezing on exam  Abdomen: soft, NT, ND, positive BS  Musculoskeletal: no cyanosis or clubbing, limited range of motion due to pain on her left arm and leg  Data Reviewed: Basic Metabolic Panel:  Recent Labs Lab 01/09/15 1218 01/10/15 0658  NA 142 141  K 3.8 3.9  CL 106 105  CO2 27 25  GLUCOSE 126* 121*  BUN 10 17  CREATININE 0.52 0.78  CALCIUM 9.3 8.5*   CBC:  Recent Labs Lab 01/09/15 1218 01/10/15 0658  WBC 9.1 14.3*  NEUTROABS 7.3  --   HGB 15.4* 12.1  HCT 45.8 36.2  MCV 92.0 92.3  PLT 134* 132*   BNP (last 3 results)  Recent Labs  01/09/15 1218  BNP 251.9*   CBG: No results for input(s): GLUCAP in the last 168 hours.  Recent Results (from the past 240 hour(s))  Surgical pcr screen     Status: None   Collection Time: 01/10/15  4:15 AM  Result Value Ref Range Status   MRSA, PCR NEGATIVE NEGATIVE Final   Staphylococcus aureus NEGATIVE NEGATIVE Final    Comment:        The Xpert SA Assay (FDA approved for NASAL specimens in patients over 68 years of age), is one component of a comprehensive surveillance program.  Test performance has been validated by [facility] for patients greater than or equal to 26 year old. It  is not intended to diagnose infection nor to guide or monitor treatment.      Studies: Dg Chest 1 View  01/09/2015   CLINICAL DATA:  Pain following fall  EXAM: CHEST  1 VIEW  COMPARISON:  None.  FINDINGS: There is no edema or consolidation. Heart is slightly enlarged with pulmonary vascularity within normal limits. There is atherosclerotic change in the aorta. No adenopathy. Patient is status post right mastectomy. There are surgical clips the right axillary region. Bones are somewhat osteoporotic. No pneumothorax. No rib fracture evident. There is arthropathy in each shoulder.   IMPRESSION: Mild cardiac enlargement.  No edema or consolidation.   Electronically Signed   By: Lowella Grip III M.D.   On: 01/09/2015 13:52   Ct Head Wo Contrast  01/09/2015   CLINICAL DATA:  Unwitnessed fall.  Pain.  EXAM: CT HEAD WITHOUT CONTRAST  CT CERVICAL SPINE WITHOUT CONTRAST  TECHNIQUE: Multidetector CT imaging of the head and cervical spine was performed following the standard protocol without intravenous contrast. Multiplanar CT image reconstructions of the cervical spine were also generated.  COMPARISON:  None.  FINDINGS: CT HEAD FINDINGS  There is no acute intracranial hemorrhage, acute infarction, or worrisome intracranial mass lesion. The patient does have a 15 mm calcified meningioma on the inner toe mobile the left side of the frontal bone. There is no edema in the adjacent brain tissue.  The patient has benign basal ganglia calcifications as well as white matter calcification in the left frontal lobe. Diffuse cerebral cortical and cerebellar atrophy, most prominent in the frontal and temporal lobes. No acute osseous abnormality.  CT CERVICAL SPINE FINDINGS  Congenital incomplete posterior arch of C1. No fracture or acute subluxation. No prevertebral soft tissue swelling. Severe degenerative disc disease at C3-4 and C4-5. Auto fusion of the C5 and C6 vertebra. Impending auto fusion at C6-7.  Moderately severe facet arthritis at C2-3 and C3-4 on the left. Less severe facet arthritis from C2-3 through T2-3 on the right.  2 mm anterolisthesis of C7 on T1. Disc space narrowing at T1-2 and T2-3.  IMPRESSION: 1. No acute intracranial abnormality. Atrophy. Calcified meningioma on the left frontal bone. 2. No acute abnormality of the cervical spine. Multilevel degenerative disc and joint disease.   Electronically Signed   By: Lorriane Shire M.D.   On: 01/09/2015 13:46   Ct Cervical Spine Wo Contrast  01/09/2015   CLINICAL DATA:  Unwitnessed fall.  Pain.  EXAM: CT HEAD WITHOUT CONTRAST  CT  CERVICAL SPINE WITHOUT CONTRAST  TECHNIQUE: Multidetector CT imaging of the head and cervical spine was performed following the standard protocol without intravenous contrast. Multiplanar CT image reconstructions of the cervical spine were also generated.  COMPARISON:  None.  FINDINGS: CT HEAD FINDINGS  There is no acute intracranial hemorrhage, acute infarction, or worrisome intracranial mass lesion. The patient does have a 15 mm calcified meningioma on the inner toe mobile the left side of the frontal bone. There is no edema in the adjacent brain tissue.  The patient has benign basal ganglia calcifications as well as white matter calcification in the left frontal lobe. Diffuse cerebral cortical and cerebellar atrophy, most prominent in the frontal and temporal lobes. No acute osseous abnormality.  CT CERVICAL SPINE FINDINGS  Congenital incomplete posterior arch of C1. No fracture or acute subluxation. No prevertebral soft tissue swelling. Severe degenerative disc disease at C3-4 and C4-5. Auto fusion of the C5 and C6 vertebra. Impending auto fusion at C6-7.  Moderately  severe facet arthritis at C2-3 and C3-4 on the left. Less severe facet arthritis from C2-3 through T2-3 on the right.  2 mm anterolisthesis of C7 on T1. Disc space narrowing at T1-2 and T2-3.  IMPRESSION: 1. No acute intracranial abnormality. Atrophy. Calcified meningioma on the left frontal bone. 2. No acute abnormality of the cervical spine. Multilevel degenerative disc and joint disease.   Electronically Signed   By: Lorriane Shire M.D.   On: 01/09/2015 13:46   Dg Shoulder Left  01/09/2015   CLINICAL DATA:  Unwitnessed fall, LEFT arm deformity  EXAM: LEFT SHOULDER - 2+ VIEW  COMPARISON:  None  FINDINGS: Marked osseous demineralization.  Mid diaphyseal fracture LEFT humerus with apex medial angulation, medial displacement, and overriding.  Advanced LEFT glenohumeral degenerative changes and probable LEFT chronic rotator cuff tear.  Level  alignment inadequately assessed on this exam.  Visualized ribs intact.  No glenohumeral dislocation or AC joint malalignment identified.  IMPRESSION: Displaced angulated and overriding mid diaphyseal fracture LEFT humerus.  Degenerative changes LEFT shoulder.  Marked osseous demineralization.   Electronically Signed   By: Lavonia Dana M.D.   On: 01/09/2015 13:55   Dg Hip Unilat With Pelvis 2-3 Views Left  01/09/2015   CLINICAL DATA:  Unwitnessed fall with left leg deformity  EXAM: LEFT HIP (WITH PELVIS) 2-3 VIEWS  COMPARISON:  None.  FINDINGS: Acute left femoral neck fracture, subcapital. There is displacement with upward migration of the distal femur. No dislocation. No notable degenerative changes to the left acetabulum. No evidence of pelvic ring fracture.  1.2 cm ovoid sclerotic focus in the intertrochanteric right femur. Noted history of breast cancer, but morphology favors a bone island.  IMPRESSION: 1. Displaced subcapital left femoral neck fracture. 2. Sclerotic focus in the proximal right femur favors a bone island.   Electronically Signed   By: Monte Fantasia M.D.   On: 01/09/2015 13:57   Dg Femur Min 2 Views Left  01/09/2015   CLINICAL DATA:  Pain following fall  EXAM: LEFT FEMUR 2 VIEWS  COMPARISON:  None.  FINDINGS: Frontal and lateral views were obtained. There is a subcapital femoral neck fracture on the left with varus angulation at the fracture site. There is mild impaction of the inferior femoral neck along the femoral head in the region of the fracture. No other fracture. No dislocation. There is moderate osteoarthritic change in the hip and knee joints. There is moderate arterial vascular calcification. No apparent knee joint effusion.  IMPRESSION: Subcapital femoral neck fracture on the left with varus angulation at the fracture site and mild impaction.   Electronically Signed   By: Lowella Grip III M.D.   On: 01/09/2015 13:53    Scheduled Meds: . [MAR Hold] antiseptic oral rinse   7 mL Mouth Rinse BID  . [MAR Hold] atorvastatin  20 mg Oral QHS  . [MAR Hold] escitalopram  10 mg Oral QPC lunch  . [MAR Hold] levothyroxine  50 mcg Oral QAC breakfast  . [MAR Hold] lisinopril  10 mg Oral QPC lunch  . [MAR Hold] timolol  1 drop Both Eyes QPC lunch   Continuous Infusions: . lactated ringers 10 mL/hr at 01/10/15 1627    Principal Problem:   Left displaced femoral neck fracture Active Problems:   Hypertension   Hypothyroid   Alzheimer disease    Time spent: 30 minutes    [name]  [facility] Pager [phone]. If 7PM-7AM, please contact night-coverage at [email or web address], password [facility] 01/10/2015, 5:22 PM  LOS: 1 day

## 2015-01-10 NOTE — Progress Notes (Signed)
Pt stable - labs ok Plan or tonight for fixation of fractures

## 2015-01-10 NOTE — Anesthesia Postprocedure Evaluation (Signed)
Anesthesia Post Note  Patient: Amber Fowler  Procedure(s) Performed: Procedure(s) (LRB): ARTHROPLASTY BIPOLAR HIP (HEMIARTHROPLASTY) (Left) OPEN REDUCTION INTERNAL FIXATION (ORIF) HUMERAL SHAFT FRACTURE (Left)  Anesthesia type: general  Patient location: PACU  Post pain: Pain level controlled  Post assessment: Patient's Cardiovascular Status Stable  Last Vitals:  Filed Vitals:   01/10/15 2349  BP: 129/41  Pulse: 58  Temp:   Resp: 15    Post vital signs: Reviewed and stable  Level of consciousness: sedated  Complications: No apparent anesthesia complications

## 2015-01-10 NOTE — Clinical Social Work Note (Signed)
Clinical Social Work Assessment  Patient Details  Name: Amber Fowler MRN: [id number] Date of Birth: February 28, 1919  Date of referral:  01/10/15               Reason for consult:  Discharge Planning, Facility Placement                Permission sought to share information with:  Facility Sport and exercise psychologist, Family Supports Permission granted to share information::  Yes, Verbal Permission Granted  Name::     [name]  Agency::  SNFs  Relationship::  Son  Contact Information:     Housing/Transportation Living arrangements for the past 2 months:  [place] of Information:  Adult Children Patient Interpreter Needed:  None Criminal Activity/Legal Involvement Pertinent to Current Situation/Hospitalization:  No - Comment as needed Significant Relationships:  Adult Children Lives with:  Facility Resident, Self Do you feel safe going back to the place where you live?  No Need for family participation in patient care:  Yes (Comment)  Care giving concerns:  Patient's son [name] states that he would like for the patient to go SNF at discharge as he is concerned [facility] cannot managed her at this time.    Social Worker assessment / plan:  CSW met with patient at bedside. Patient is currently disoriented. CSW contacted patient's son [name] to discuss disposition planning. [name] shares that the patient was admitted from independent living at [facility] but will eventually be going to the ALF level of care. At discharge, [name] believes that the patient will need a higher level of care like SNF. CSW explained that the treatment team will likely also be making this recommendation. CSW explained SNF search/placement process and answered his questions. [name] requests placement at [facility].   Employment status:  Retired Forensic scientist:  Medicare PT Recommendations:  [place] / Referral to community resources:  [place]  Patient/Family's Response to care:  [name] seems content with the care the patient has received.  Patient/Family's Understanding of and Emotional Response to Diagnosis, Current Treatment, and Prognosis:  [name] has great insight into reason for patient's admission and her post DC needs. Patient does not have this level of insight.   Emotional Assessment Appearance:  Appears older than stated age Attitude/Demeanor/Rapport:  Other (Appropriate) Affect (typically observed):  Calm, Quiet, Stable Orientation:  Oriented to Self Alcohol / Substance use:  Never Used Psych involvement (Current and /or in the community):  No (Comment)  Discharge Needs  Concerns to be addressed:  Discharge Planning Concerns Readmission within the last 30 days:  No Current discharge risk:  Physical Impairment, Cognitively Impaired Barriers to Discharge:  Continued Medical Work up  .Liz Beach MSW, [place], [place], [phone]

## 2015-01-10 NOTE — Anesthesia Preprocedure Evaluation (Addendum)
Anesthesia Evaluation  Patient identified by MRN, date of birth, ID band Patient awake    Reviewed: Allergy & Precautions, NPO status , Patient's Chart, lab work & pertinent test results  Airway Mallampati: II  TM Distance: >3 FB Neck ROM: Full    Dental   Pulmonary neg pulmonary ROS,  breath sounds clear to auscultation        Cardiovascular hypertension, Pt. on medications Rhythm:Regular Rate:Normal     Neuro/Psych +Alzheimers dznegative neurological ROS     GI/Hepatic negative GI ROS, Neg liver ROS,   Endo/Other  Hypothyroidism   Renal/GU negative Renal ROS     Musculoskeletal  (+) Arthritis -,   Abdominal   Peds  Hematology negative hematology ROS (+)   Anesthesia Other Findings   Reproductive/Obstetrics                            Anesthesia Physical Anesthesia Plan  ASA: III  Anesthesia Plan: General   Post-op Pain Management:    Induction: Intravenous  Airway Management Planned: Oral ETT  Additional Equipment:   Intra-op Plan:   Post-operative Plan: Extubation in OR  Informed Consent: I have reviewed the patients History and Physical, chart, labs and discussed the procedure including the risks, benefits and alternatives for the proposed anesthesia with the patient or authorized representative who has indicated his/her understanding and acceptance.   Dental advisory given  Plan Discussed with: CRNA  Anesthesia Plan Comments:         Anesthesia Quick Evaluation

## 2015-01-10 NOTE — Progress Notes (Signed)
Initial Nutrition Assessment  DOCUMENTATION CODES:  Not applicable  INTERVENTION:   (RD to follow for diet advancement, supplement diet as appropriate)  NUTRITION DIAGNOSIS:  Inadequate oral intake related to inability to eat as evidenced by NPO status.   GOAL:  Patient will meet greater than or equal to 90% of their needs   MONITOR:  PO intake, Supplement acceptance, Diet advancement, Labs, Weight trends, I & O's, Skin  REASON FOR ASSESSMENT:  Consult Assessment of nutrition requirement/status  ASSESSMENT: Amber Fowler is a 81 y.o. female  With past medical history of hypothyroidism, hypertension and Alzheimer's disease who was previously from out of town and has been recently moved up here to [place] under the care of her son who is also a Engineer, drilling. Patient was brought in from independent living by EMS after she was found by staff. Patient had an unwitnessed fall and was down for unknown length of time.  Pt admitted with lt humerus and lt femur fx from [facility]. Pt is scheduled for lt hemiarthroplasty last this afternoon.   Pt is NPO in preparation for surgery today. Pt is lethargic and did not answer this RD's questions. Ht and wt hx currently unavailable.   Nutrition-Focused physical exam completed. Findings are mild fat depletion, mild muscle depletion, and no edema. It is difficult to determine if fat and muscle loss is related to advanced age or decreased po intake.   RD will follow for diet advancement and supplement diet accordingly.    Height:  Ht Readings from Last 1 Encounters:  No data found for Ht    Weight:  Wt Readings from Last 1 Encounters:  01/10/15 114 lb 3.2 oz (51.8 kg)    Ideal Body Weight:     Wt Readings from Last 10 Encounters:  01/10/15 114 lb 3.2 oz (51.8 kg)    BMI:  There is no height on file to calculate BMI.  Estimated Nutritional Needs:  Kcal:  1400-1600  Protein:  55-65 grams  Fluid:  1.4-1.6  L  Skin:  Reviewed, no issues  Diet Order:  Diet NPO time specified  EDUCATION NEEDS:  No education needs identified at this time   Intake/Output Summary (Last 24 hours) at 01/10/15 1437 Last data filed at 01/10/15 0704  Gross per 24 hour  Intake      0 ml  Output   1325 ml  Net  -1325 ml    Last BM:  PTA  Jaydy Fitzhenry A. Jimmye Norman, RD, LDN, CDE Pager: [phone] After hours Pager: [phone]

## 2015-01-10 NOTE — Progress Notes (Signed)
Pt had mastectomy in 2003 on right side. Talked to son Dr. Jeanann Lewandowsky, he stated it was okay to take blood pressures on the right arm.

## 2015-01-11 DIAGNOSIS — S42415D Nondisplaced simple supracondylar fracture without intercondylar fracture of left humerus, subsequent encounter for fracture with routine healing: Secondary | ICD-10-CM

## 2015-01-11 DIAGNOSIS — S72002D Fracture of unspecified part of neck of left femur, subsequent encounter for closed fracture with routine healing: Secondary | ICD-10-CM

## 2015-01-11 LAB — PROTIME-INR
INR: 1.31 (ref 0.00–1.49)
Prothrombin Time: 16.4 seconds — ABNORMAL HIGH (ref 11.6–15.2)

## 2015-01-11 LAB — CBC
HCT: 26.6 % — ABNORMAL LOW (ref 36.0–46.0)
Hemoglobin: 8.8 g/dL — ABNORMAL LOW (ref 12.0–15.0)
MCH: 31 pg (ref 26.0–34.0)
MCHC: 33.1 g/dL (ref 30.0–36.0)
MCV: 93.7 fL (ref 78.0–100.0)
Platelets: 102 10*3/uL — ABNORMAL LOW (ref 150–400)
RBC: 2.84 MIL/uL — ABNORMAL LOW (ref 3.87–5.11)
RDW: 14.7 % (ref 11.5–15.5)
WBC: 10.8 10*3/uL — AB (ref 4.0–10.5)

## 2015-01-11 LAB — BASIC METABOLIC PANEL
ANION GAP: 6 (ref 5–15)
BUN: 24 mg/dL — AB (ref 6–20)
CO2: 27 mmol/L (ref 22–32)
Calcium: 7.7 mg/dL — ABNORMAL LOW (ref 8.9–10.3)
Chloride: 108 mmol/L (ref 101–111)
Creatinine, Ser: 0.76 mg/dL (ref 0.44–1.00)
GFR calc non Af Amer: >60 mL/min (ref >60)
Glucose, Bld: 147 mg/dL — ABNORMAL HIGH (ref 65–99)
Potassium: 4.5 mmol/L (ref 3.5–5.1)
Sodium: 141 mmol/L (ref 135–145)

## 2015-01-11 LAB — T3, FREE: T3, Free: 1.9 pg/mL — ABNORMAL LOW (ref 2.0–4.4)

## 2015-01-11 MED ORDER — MORPHINE SULFATE 2 MG/ML IJ SOLN
0.5000 mg | INTRAMUSCULAR | Status: DC | PRN
  Administered 2015-01-11: 0.5 mg via INTRAVENOUS
  Filled 2015-01-11: qty 1

## 2015-01-11 MED ORDER — HYDROCODONE-ACETAMINOPHEN 5-325 MG PO TABS: 1.0000 | ORAL_TABLET | Freq: Three times a day (TID) | ORAL | Status: DC | PRN

## 2015-01-11 MED ORDER — ACETAMINOPHEN 325 MG PO TABS
650.0000 mg | ORAL_TABLET | Freq: Four times a day (QID) | ORAL | Status: DC | PRN
  Administered 2015-01-13: 650 mg via ORAL
  Filled 2015-01-11: qty 2

## 2015-01-11 MED ORDER — ONDANSETRON HCL 4 MG/2ML IJ SOLN: 4.0000 mg | Freq: Four times a day (QID) | INTRAMUSCULAR | Status: DC | PRN

## 2015-01-11 MED ORDER — METOCLOPRAMIDE HCL 5 MG PO TABS
5.0000 mg | ORAL_TABLET | Freq: Three times a day (TID) | ORAL | Status: DC | PRN
  Filled 2015-01-11: qty 2

## 2015-01-11 MED ORDER — ONDANSETRON HCL 4 MG PO TABS: 4.0000 mg | ORAL_TABLET | Freq: Four times a day (QID) | ORAL | Status: DC | PRN

## 2015-01-11 MED ORDER — METOCLOPRAMIDE HCL 5 MG/ML IJ SOLN
5.0000 mg | Freq: Three times a day (TID) | INTRAMUSCULAR | Status: DC | PRN
  Filled 2015-01-11: qty 2

## 2015-01-11 MED ORDER — ACETAMINOPHEN 650 MG RE SUPP
650.0000 mg | Freq: Four times a day (QID) | RECTAL | Status: DC | PRN
Start: 2015-01-11 — End: 2015-01-16

## 2015-01-11 MED ORDER — POLYSACCHARIDE IRON COMPLEX 150 MG PO CAPS
150.0000 mg | ORAL_CAPSULE | Freq: Two times a day (BID) | ORAL | Status: DC
  Administered 2015-01-13 – 2015-01-16 (×6): 150 mg via ORAL
  Filled 2015-01-11 (×11): qty 1

## 2015-01-11 MED ORDER — RIVAROXABAN 10 MG PO TABS
10.0000 mg | ORAL_TABLET | Freq: Every day | ORAL | Status: DC
  Administered 2015-01-14 – 2015-01-16 (×3): 10 mg via ORAL
  Filled 2015-01-11 (×6): qty 1

## 2015-01-11 MED ORDER — HYDROCODONE-ACETAMINOPHEN 5-325 MG PO TABS: 1.0000 | ORAL_TABLET | Freq: Four times a day (QID) | ORAL | Status: DC | PRN

## 2015-01-11 MED ORDER — MENTHOL 3 MG MT LOZG: 1.0000 | LOZENGE | OROMUCOSAL | Status: DC | PRN

## 2015-01-11 MED ORDER — PHENOL 1.4 % MT LIQD: 1.0000 | OROMUCOSAL | Status: DC | PRN

## 2015-01-11 NOTE — Clinical Social Work Placement (Signed)
   CLINICAL SOCIAL WORK PLACEMENT  NOTE  Date:  01/11/2015  Patient Details  Name: Amber Fowler MRN: [id number] Date of Birth: April 18, 1919  Clinical Social Work is seeking post-discharge placement for this patient at the [place] level of care (*CSW will initial, date and re-position this form in  chart as items are completed):  Yes   Patient/family provided with [place] Work Department's list of facilities offering this level of care within the geographic area requested by the patient (or if unable, by the patient's family).  Yes   Patient/family informed of their freedom to choose among providers that offer the needed level of care, that participate in Medicare, Medicaid or managed care program needed by the patient, have an available bed and are willing to accept the patient.  Yes   Patient/family informed of [facility]'s ownership interest in [facility] and [facility], as well as of the fact that they are under no obligation to receive care at these facilities.  PASRR submitted to EDS on 01/10/15     PASRR number received on 01/10/15     Existing PASRR number confirmed on       FL2 transmitted to all facilities in geographic area requested by pt/family on 01/10/15     FL2 transmitted to all facilities within larger geographic area on       Patient informed that his/her managed care company has contracts with or will negotiate with certain facilities, including the following:            Patient/family informed of bed offers received.  Patient chooses bed at       Physician recommends and patient chooses bed at      Patient to be transferred to   on  .  Patient to be transferred to facility by       Patient family notified on   of transfer.  Name of family member notified:        PHYSICIAN       Additional Comment:    _______________________________________________ Rigoberto Noel, LCSW 01/11/2015, 8:04 AM

## 2015-01-11 NOTE — Evaluation (Signed)
Occupational Therapy Evaluation Patient Details Name: Amber [facility] MRN: [id number] DOB: [birth year] Today's Date: 01/11/2015    History of Present Illness pt is a 81 y/o female With past medical history of hypothyroidism, hypertension and Alzheimer's disease who  present after fall with L hip and humeral fx's, s/p L hemiarthroplasty and immobilized L arm in a sling.  Pt has not been able to arouse post surgery.   Clinical Impression   Pt admitted with above. She demonstrates the below listed deficits and will benefit from continued OT to maximize safety and independence with BADLs.  Pt with decreased arousal during OT eval.  She keeps eyes closed, does not respond, does not follow commands.  She did sit EOB with min guard to min A, but total A for all aspects of ADLs and total A +2 for standing (pt did not attempt to assist).  VITALS--BP sitting 86/35 (46), initial lying supine flat  115/35 (53),  hob AT 30* 90/36 (47) RN notified .  Recommend SNF [facility] at discharge.       Follow Up Recommendations  SNF    Equipment Recommendations  None recommended by OT    Recommendations for Other Services       Precautions / Restrictions Precautions Precautions: Posterior Hip Precaution Comments: L UE in sling at all times Required Braces or Orthoses: Knee Immobilizer - Left;Sling Knee Immobilizer - Left:  (used in bed to help maintain her precautions.) Restrictions Weight Bearing Restrictions: Yes LUE Weight Bearing: Non weight bearing LLE Weight Bearing: Weight bearing as tolerated      Mobility Bed Mobility Overal bed mobility: Needs Assistance;+2 for physical assistance Bed Mobility: Rolling;Supine to Sit;Sit to Supine Rolling: Total assist;+2 for physical assistance   Supine to sit: Total assist;+2 for physical assistance Sit to supine: Total assist;+2 for physical assistance   General bed mobility comments: pt not aroused enough to participate.  Transfers Overall  transfer [facility]: Needs assistance Equipment used: 2 person hand held assist Transfers: Sit to/from Stand Sit to Stand: Total assist;+2 physical assistance         General transfer comment: not attempt to bear weight by pt.    Balance Overall balance assessment: Needs assistance Sitting-balance support: Feet supported;Single extremity supported Sitting balance-Leahy Scale: Fair Sitting balance - Comments: sat EOB without assist, but pt did not attempt to move voluntarily.   Standing balance support: Single extremity supported Standing balance-Leahy Scale: Zero                              ADL Overall ADL's : Needs assistance/impaired Eating/Feeding: NPO (due to arousal )   Grooming: Wash/dry hands;Wash/dry face;Oral care;Applying deodorant;Total assistance;Sitting   Upper Body Bathing: Total assistance;Sitting;Bed [facility]   Lower Body Bathing: Total assistance;Sit to/from stand;Bed Fowler   Upper Body Dressing : Total assistance;Sitting;Bed [facility]   Lower Body Dressing: Total assistance;Sitting/lateral leans;Bed [facility]   Toilet Transfer: Total assistance Toilet Transfer Details (indicate cue type and reason): unable  Toileting- Clothing Manipulation and Hygiene: Total assistance;Bed [facility]       Functional mobility during ADLs: Total assistance;+2 for physical assistance General ADL Comments: Pt does not attempt to assist or participate      Vision     Perception     Praxis      Pertinent Vitals/Pain Pain Assessment: Faces Faces Pain Scale: Hurts whole lot Pain Location: Lt LE, maybe Lt UE - difficult to determine as pt unable to  verbalize  Pain Descriptors / Indicators: Guarding Pain Intervention(s): Monitored during session     Hand Dominance     Extremity/Trunk Assessment Upper Extremity Assessment Upper Extremity Assessment: LUE deficits/detail;RUE deficits/detail RUE Deficits / Details: Pt with very little spontaneous movement of Rt UE noted.   LUE Deficits / Details: Lt UE immobilized in sling.  No orders re: precautions or limitations    Lower Extremity Assessment Lower Extremity Assessment: Defer to PT evaluation RLE Deficits / Details: arthritic knees, difficult to range fully, painful bil, but L more painful than R RLE: Unable to fully assess due to pain RLE Coordination: decreased gross motor;decreased fine motor LLE Deficits / Details: stiff, painful, difficult to mobilize with heavy crepitus both knees.  unable to MMT. LLE: Unable to fully assess due to pain LLE Coordination: decreased gross motor;decreased fine motor       Communication     Cognition Arousal/Alertness: Lethargic Behavior During Therapy: Flat affect Overall Cognitive Status: Difficult to assess                 General Comments: Pt with decreased arousal.  keeps eyes closed.  Follows no commands and makes no attempt to assist    General Comments       Exercises Exercises:  (warm up hip/knee flex/ext ROM exercise prior to mobility)     Shoulder Instructions      Home Living Family/patient expects to be discharged to:: Skilled nursing facility                                 Additional Comments: pt supposedly ambulatory, but not corroborated with family      Prior Functioning/Environment [facility] [place]: Needs assistance    ADL's / Homemaking Assistance Needed: required assist for IADLs    Comments: Per chart review, pt lived at [facility] in independent living, mild dementia.  Family was planning to transition her to ALF.  No family present to provide info re: functional status     OT Diagnosis: Generalized weakness;Cognitive deficits;Acute pain;Altered mental status   OT Problem List: Decreased strength;Decreased activity tolerance;Impaired balance (sitting and/or standing);Decreased cognition;Decreased safety awareness;Decreased knowledge of use of DME or AE;Decreased knowledge of precautions;Pain   OT  Treatment/Interventions: Self-care/ADL training;DME and/or AE instruction;Therapeutic activities;Visual/perceptual remediation/compensation;Cognitive remediation/compensation;Patient/family education;Balance training    OT Goals(Current goals can be found in the care plan section) Acute Rehab OT Goals Patient Stated Goal: unable OT Goal Formulation: Patient unable to participate in goal setting Time For Goal Achievement: 01/18/15 Potential to Achieve Goals: Fair ADL Goals Pt Will Perform Grooming: with min assist;sitting Pt Will Transfer to Toilet: with mod assist;stand pivot transfer;bedside commode Pt Will Perform Toileting - Clothing Manipulation and hygiene: with mod assist;sit to/from stand Additional ADL Goal #1: Pt will maintain adequate arousal to participate in 10 mins therapeutic activity   OT Frequency: Min 2X/week   Barriers to D/C: Decreased caregiver support          Co-evaluation PT/OT/SLP Co-Evaluation/Treatment: Yes Reason for Co-Treatment: Complexity of the patient's impairments (multi-system involvement);For patient/therapist safety   OT goals addressed during session: Strengthening/ROM;ADL's and self-care      End of Session Nurse Communication: Mobility status;Other (comment) (BP)  Activity Tolerance: Patient limited by lethargy Patient left: in bed;with call bell/phone within reach;with bed alarm set   Time: 1331-1355 OT Time Calculation (min): 24 min Charges:  OT General Charges $OT Visit: 1 Procedure OT Evaluation $Initial OT  Evaluation Tier I: 1 Procedure G-Codes:    Lucille Passy M February 04, 2015, 3:14 PM

## 2015-01-11 NOTE — Progress Notes (Addendum)
[facility] PROGRESS NOTE  Amber [facility] WGY:[id number] DOB: 1919/05/04 DOA: 01/09/2015 PCP: No primary care provider on file.  Assessment/Plan: 1-left hip fracture and left humerus fracture: secondary to mechanical fall -patient is moderate to high risk for surgery but not further workup needed for optimization -she was very functional prior to admission at baseline and living in independent setting -Post operative rec's per orthopedic service  -will follow clinical response and continue supportive care -will need SNF at discharge  2-HTN: patient BP severely elevated on admission; pain most likely contributing -continue lisinopril and follow VS -might need further antihypertensive adjustment and low sodium diet  3-hypothyroidism -TSH and free T4 WNL -continue synthroid at current dose  4-leukocytosis -appears to be secondary to demargination -continue holding abx's for now and will follow trend -CBC this am 10.8  5-Alzheimer disease: mild according to family reports on admission and very functional -will monitor closely for sundowning -minimize narcotics, in fact will avoid IV narcotic pain meds if possible -continue supportive care  6-ABLA: Hgb 8.8 -will add niferex BID -follow Hgb and transfuse if less than 7  Code Status: DNR Family Communication: no family at bedside Disposition Plan: to be determine; but most likely will require SNF for rehab   Consultants:  Dr. Marlou Sa (orthopedic service)  Procedures:  S/P left hemiarthroplasty and left humerus fx fixation 6/15  Antibiotics:  None   HPI/Subjective: Afebrile, lethargic and having difficulties following commands.  Objective: Filed Vitals:   01/11/15 1548  BP: 100/45  Pulse: 68  Temp:   Resp: 15    Intake/Output Summary (Last 24 hours) at 01/11/15 1709 Last data filed at 01/11/15 1548  Gross per 24 hour  Intake   3025 ml  Output    810 ml  Net   2215 ml   Filed Weights   01/09/15 1749  01/10/15 0526 01/11/15 0013  Weight: 51.3 kg (113 lb 1.5 oz) 51.8 kg (114 lb 3.2 oz) 56 kg (123 lb 7.3 oz)    Exam:   General:  Afebrile, very lethargic, stable vital signs but having difficulties following commands.   Cardiovascular: S1 and S2, no rubs or gallops  Respiratory: CTA bilaterally, no rales or wheezing on exam  Abdomen: soft, NT, ND, positive BS  Musculoskeletal: no cyanosis or clubbing, has sling on LUE and leg immobilizer on her LLE  Data Reviewed: Basic Metabolic Panel:  Recent Labs Lab 01/09/15 1218 01/10/15 0658 01/11/15 0340  NA 142 141 141  K 3.8 3.9 4.5  CL 106 105 108  CO2 27 25 27   GLUCOSE 126* 121* 147*  BUN 10 17 24*  CREATININE 0.52 0.78 0.76  CALCIUM 9.3 8.5* 7.7*   CBC:  Recent Labs Lab 01/09/15 1218 01/10/15 0658 01/11/15 0340  WBC 9.1 14.3* 10.8*  NEUTROABS 7.3  --   --   HGB 15.4* 12.1 8.8*  HCT 45.8 36.2 26.6*  MCV 92.0 92.3 93.7  PLT 134* 132* 102*   BNP (last 3 results)  Recent Labs  01/09/15 1218  BNP 251.9*   CBG: No results for input(s): GLUCAP in the last 168 hours.  Recent Results (from the past 240 hour(s))  Surgical pcr screen     Status: None   Collection Time: 01/10/15  4:15 AM  Result Value Ref Range Status   MRSA, PCR NEGATIVE NEGATIVE Final   Staphylococcus aureus NEGATIVE NEGATIVE Final    Comment:        The Xpert SA Assay (FDA approved for NASAL specimens  in patients over 39 years of age), is one component of a comprehensive surveillance program.  Test performance has been validated by [facility] for patients greater than or equal to 38 year old. It is not intended to diagnose infection nor to guide or monitor treatment.      Studies: Pelvis Portable  01/11/2015   CLINICAL DATA:  Status post left hip arthroplasty. Initial encounter.  EXAM: PORTABLE PELVIS 1-2 VIEWS  COMPARISON:  Left hip radiographs performed 01/09/2015  FINDINGS: There has been interval placement of a left hip  hemiarthroplasty, noted in expected alignment, without evidence of loosening. No new fractures are seen. Overlying postoperative soft tissue air and skin staples are seen.  The right hip joint is grossly unremarkable in appearance. The visualized bowel gas pattern is within normal limits.  IMPRESSION: Interval placement of left hip hemiarthroplasty, noted in expected alignment, without evidence of loosening. No new fracture seen.   Electronically Signed   By: Garald Balding M.D.   On: 01/11/2015 01:56   Dg Humerus Left  01/11/2015   CLINICAL DATA:  Internal fixation of left humerus fracture. Initial encounter.  EXAM: LEFT HUMERUS - 2+ VIEW  COMPARISON:  Left shoulder radiographs performed 01/09/2015  FINDINGS: The patient is status post internal fixation of the fracture through the mid diaphysis of the left humerus, seen in essentially anatomic alignment, with associated plate and screws. No new fractures are seen. Overlying skin staples are noted. Scattered soft tissue air is seen.  There is chronic degenerative change about the left glenohumeral joint and at the left acromioclavicular joint. The visualized portions of the left lung are grossly clear.  IMPRESSION: Status post internal fixation of the fracture through the mid diaphysis of the left humerus, seen in essentially anatomic alignment. No new fracture seen.   Electronically Signed   By: Garald Balding M.D.   On: 01/11/2015 01:57   Dg Humerus Left  01/10/2015   CLINICAL DATA:  Left humerus fracture.  EXAM: LEFT HUMERUS - 2+ VIEW; DG C-ARM 61-120 MIN  FLUOROSCOPY TIME:  20 seconds.  COMPARISON:  Radiographs dated 01/09/2015  FINDINGS: AP and lateral C-arm images demonstrate the patient has undergone open reduction and internal fixation of the spiral fracture of the midshaft of the left humerus. Side plate and multiple screws have been inserted. Alignment and position is essentially anatomic.  IMPRESSION: Open reduction and internal fixation of left  humerus fracture.   Electronically Signed   By: Lorriane Shire M.D.   On: 01/10/2015 20:24   Dg C-arm 61-120 Min  01/10/2015   CLINICAL DATA:  Left humerus fracture.  EXAM: LEFT HUMERUS - 2+ VIEW; DG C-ARM 61-120 MIN  FLUOROSCOPY TIME:  20 seconds.  COMPARISON:  Radiographs dated 01/09/2015  FINDINGS: AP and lateral C-arm images demonstrate the patient has undergone open reduction and internal fixation of the spiral fracture of the midshaft of the left humerus. Side plate and multiple screws have been inserted. Alignment and position is essentially anatomic.  IMPRESSION: Open reduction and internal fixation of left humerus fracture.   Electronically Signed   By: Lorriane Shire M.D.   On: 01/10/2015 20:24    Scheduled Meds: . antiseptic oral rinse  7 mL Mouth Rinse BID  . atorvastatin  20 mg Oral QHS  . escitalopram  10 mg Oral QPC lunch  . levothyroxine  50 mcg Oral QAC breakfast  . lisinopril  10 mg Oral QPC lunch  . rivaroxaban  10 mg Oral Daily  .  timolol  1 drop Both Eyes QPC lunch   Continuous Infusions: . 0.9 % NaCl with KCl 20 mEq / L 75 mL/hr at 01/11/15 0018  . lactated ringers 10 mL/hr at 01/10/15 1627    Principal Problem:   Left displaced femoral neck fracture Active Problems:   Hypertension   Hypothyroid   Alzheimer disease   Hip fracture requiring operative repair    Time spent: 18 minutes    [name]  [facility] Pager [phone]. If 7PM-7AM, please contact night-coverage at [email or web address], password [facility] 01/11/2015, 5:09 PM  LOS: 2 days

## 2015-01-11 NOTE — Clinical Social Work Note (Signed)
CSW spoke with Amber [facility] at [facility]. Amber [facility] can accept the pt. CSW updated the pt's son [name]. CSW will continue to assist and provided support with discharge.   [place], MSW, [place]

## 2015-01-11 NOTE — Progress Notes (Signed)
Pt in bed Very sluggish today Does have pain when i roll the left leg and move the left arm Post op xrays look good Patient on no real narcotics

## 2015-01-11 NOTE — Evaluation (Signed)
Physical Therapy Evaluation Patient Details Name: Amber [facility] MRN: [id number] DOB: 1919-05-01 Today's Date: 01/11/2015   History of Present Illness  pt is a 81 y/o female With past medical history of hypothyroidism, hypertension and Alzheimer's disease who  present after fall with L hip and humeral fx's, s/p L hemiarthroplasty and immobilized L arm in a sling.  Pt has not been able to arouse post surgery.  Clinical Impression  Pt admitted with/for fall with L humeral and L hip fx, s/p L hemi.  Pt currently limited functionally due to the problems listed. ( See problems list.)   Pt will benefit from PT to maximize function and safety in order to get ready for next venue listed below.     Follow Up Recommendations SNF;Supervision/Assistance - 24 hour    Equipment Recommendations  Other (comment) (TBA next venue)    Recommendations for Other Services       Precautions / Restrictions Precautions Precautions: Posterior Hip Precaution Comments: L UE in sling at all times Required Braces or Orthoses: Knee Immobilizer - Left;Sling Knee Immobilizer - Left:  (used in bed to help maintain her precautions.) Restrictions Weight Bearing Restrictions: Yes LUE Weight Bearing: Non weight bearing LLE Weight Bearing: Weight bearing as tolerated      Mobility  Bed Mobility Overal bed mobility: Needs Assistance;+2 for physical assistance Bed Mobility: Rolling;Supine to Sit;Sit to Supine Rolling: Total assist;+2 for physical assistance   Supine to sit: Total assist;+2 for physical assistance Sit to supine: Total assist;+2 for physical assistance   General bed mobility comments: pt not aroused enough to participate.  Transfers Overall transfer level: Needs assistance Equipment used: 2 person hand held assist Transfers: Sit to/from Stand Sit to Stand: Total assist;+2 physical assistance         General transfer comment: not attempt to bear weight by pt.  Ambulation/Gait             General Gait Details: unable today  Stairs            Wheelchair Mobility    Modified Rankin (Stroke Patients Only)       Balance Overall balance assessment: Needs assistance Sitting-balance support: No upper extremity supported;Single extremity supported;Feet supported Sitting balance-Leahy Scale: Fair Sitting balance - Comments: sat EOB without assist, but pt did not attempt to move voluntarily.     Standing balance-Leahy Scale: Zero                               Pertinent Vitals/Pain Pain Assessment: Faces Faces Pain Scale: Hurts whole lot Pain Location: L LE Pain Descriptors / Indicators: Grimacing;Moaning Pain Intervention(s): Monitored during session;Repositioned;Limited activity within patient's tolerance    Home Living Family/patient expects to be discharged to:: Skilled nursing facility                 Additional Comments: pt supposedly ambulatory, but not corroborated with family    Prior Function           Comments: Do not know PLOF and no family present.     Hand Dominance        Extremity/Trunk Assessment   Upper Extremity Assessment: Defer to OT evaluation           Lower Extremity Assessment: RLE deficits/detail;LLE deficits/detail RLE Deficits / Details: arthritic knees, difficult to range fully, painful bil, but L more painful than R LLE Deficits / Details: stiff, painful, difficult to mobilize with heavy  crepitus both knees.  unable to MMT.     Communication      Cognition Arousal/Alertness:  (Unable to arouse) Behavior During Therapy:  (not aroused to determine) Overall Cognitive Status: Difficult to assess (hx of Alzheimers)                      General Comments General comments (skin integrity, edema, etc.): VITALS--BP sitting 86/35 (46), initial lying supine flat  115/35 (53),  hob AT 30* 90/36 (47)    Exercises        Assessment/Plan    PT Assessment Patient needs continued PT  services  PT Diagnosis Acute pain;Difficulty walking;Generalized weakness   PT Problem List Decreased strength;Decreased range of motion;Decreased activity tolerance;Decreased balance;Decreased knowledge of use of DME;Decreased safety awareness;Cardiopulmonary status limiting activity;Pain  PT Treatment Interventions DME instruction;Gait training;Functional mobility training;Therapeutic activities;Therapeutic exercise;Patient/family education   PT Goals (Current goals can be found in the Care Plan section) Acute Rehab PT Goals Patient Stated Goal: unable PT Goal Formulation: Patient unable to participate in goal setting Time For Goal Achievement: 01/25/15 Potential to Achieve Goals: Fair    Frequency Min 3X/week   Barriers to discharge        Co-evaluation               End of Session   Activity Tolerance: Other (comment) (Unable to arouse,  BP's erratic/orthostatic.) Patient left: in bed;with call bell/phone within reach Nurse Communication: Mobility status;Other (comment) (BP's)         Time: 7078-6754 PT Time Calculation (min) (ACUTE ONLY): 24 min   Charges:   PT Evaluation $Initial PT Evaluation Tier I: 1 Procedure     PT G Codes:        Maricruz Lucero, Tessie Fass 01/11/2015, 3:03 PM 01/11/2015  Donnella Sham, [place] [phone]  (pager)

## 2015-01-11 NOTE — Op Note (Signed)
NAMEJILLIANN, SUBRAMANIAN NO.:  [id number]  MEDICAL RECORD NO.:  [id number]  LOCATION:  3S16C                        FACILITY:  [facility]  PHYSICIAN:  Anderson Malta, M.D.    DATE OF BIRTH:  03/15/1919  DATE OF PROCEDURE: DATE OF DISCHARGE:                              OPERATIVE REPORT   PREOPERATIVE DIAGNOSES:  Left humeral shaft fracture, left femoral neck fracture.  POSTOPERATIVE DIAGNOSES:  Left humeral shaft fracture, left femoral neck fracture.  PROCEDURE:  Left humeral shaft fracture open reduction and internal fixation, left hip hemiarthroplasty.  COMPONENTS USED:  A 9-hole 4.5 plate on the humeral shaft, Tri-Lock press-fit 41 ball, size 4 standard offset, +5 head.  SURGEON:  Anderson Malta, M.D.  ASSISTANT:  Erskine Emery, PA.  ANESTHESIA:  General.  INDICATIONS:  Latrice is a patient who fell and sustained 2 fractures, presents for operative management after explanation of risks and benefits.  PROCEDURE IN DETAIL:  The patient was brought to the operating room where general anesthetic was induced.  Preoperative antibiotics administered.  Time-out was called.  Attention was first directed towards the left humeral shaft fracture.  Left arm, hand prescrubbed with alcohol and Betadine, allowed to air dry, prepped with DuraPrep solution and draped in sterile manner.  Charlie Pitter was used to cover the operative field.  Anterior approach to the humeral shaft was made.  Skin and subcutaneous tissues were sharply divided.  Biceps was mobilized medially.  Brachialis was split along the humeral shaft.  Care was taken to avoid injury to neurovascular structures.  At this time, the fracture was reduced and held with a lag screw 3.5 mm.  Plate was then applied with care being taken to avoid injury to the radial nerve posteriorly. Great attention was taken to make sure the screw lengths were correct bicortical, but not too long.  Good fixation was achieved with at  least 8 cortices above and below the fracture site.  At this time, thorough irrigation was performed.  That incision was closed using 0-Vicryl suture, 2-0 Vicryl suture, and skin staples.  The patient was then turned on her side, positioned in the lateral position with the right axilla and right peroneal nerve well-padded.  The left hip was then prescrubbed with alcohol and Betadine, allowed to air dry, prepped with DuraPrep solution, draped in sterile manner.  Charlie Pitter was used to cover the operative field.  Posterior approach to the hip was made.  Skin and subcutaneous tissues were sharply divided.  Time-out was called prior to the incision for this particular case as well.  Fascia lata was split, sciatic nerve was identified, and protected at all times during the case.  Piriformis tendon was tagged.  Charnley retractor was placed. Capsule was split and tagged.  Head was removed, sized to 41 mm.  Neck cut was made.  Broaching was performed up to size 4.  Trial reduction performed with -3, 0, and +5 head.  +5 gave good restoration, gave excellent soft tissue tension.  The patient was stable with external rotation and extension, position of sleep, and 90 degrees of hip flexion, 10 degrees of abduction, and about 60 degrees of internal rotation.  At this  time, trial components were removed.  True components were placed with same stability parameters maintained.  Thorough irrigation performed.  Fascia lata closed using #1 Vicryl suture.  Skin was closed using interrupted inverted 0-Vicryl suture, 2-0 Vicryl suture, and skin staples.  Leg lengths approximately equal at the conclusion of the case.  The patient tolerated the procedure well without immediate complications.  It should be noted that sciatic nerve was palpated at the closure of the case, found to be completely intact. At this time, the patient was placed in knee immobilizer, transferred to the recovery room in stable condition.   Rosanna Randy Clark's assistance was required at all times for opening, closing, fracture manipulation, and his assistance was a medical necessity.     Anderson Malta, M.D.     GSD/MEDQ  D:  01/10/2015  T:  01/11/2015  Job:  [id number]

## 2015-01-11 NOTE — Clinical Social Work Note (Signed)
Handoff and FL2 provided to 3S CSW.  Liz Beach MSW, [place], [place], [phone]

## 2015-01-11 NOTE — Progress Notes (Signed)
Orthopedic Tech Progress Note Patient Details:  Amber Fowler [birth year] [id number]  Patient ID: [name], female   DOB: Dec 20, 1918, 81 y.o.   MRN: [id number] Pt unable to use trapeze bar patient helper  Hildred Priest 01/11/2015, 6:58 AM

## 2015-01-11 NOTE — Progress Notes (Signed)
UR COMPLETED  

## 2015-01-12 ENCOUNTER — Encounter (HOSPITAL_COMMUNITY): Payer: Self-pay | Admitting: Orthopedic Surgery

## 2015-01-12 DIAGNOSIS — R5383 Other fatigue: Secondary | ICD-10-CM

## 2015-01-12 LAB — GLUCOSE, CAPILLARY: Glucose-Capillary: 115 mg/dL — ABNORMAL HIGH (ref 65–99)

## 2015-01-12 LAB — CBC
HCT: 25.1 % — ABNORMAL LOW (ref 36.0–46.0)
HEMOGLOBIN: 8.2 g/dL — AB (ref 12.0–15.0)
MCH: 30.7 pg (ref 26.0–34.0)
MCHC: 32.7 g/dL (ref 30.0–36.0)
MCV: 94 fL (ref 78.0–100.0)
PLATELETS: 95 10*3/uL — AB (ref 150–400)
RBC: 2.67 MIL/uL — AB (ref 3.87–5.11)
RDW: 15 % (ref 11.5–15.5)
WBC: 10.2 10*3/uL (ref 4.0–10.5)

## 2015-01-12 LAB — BASIC METABOLIC PANEL
ANION GAP: 5 (ref 5–15)
BUN: 19 mg/dL (ref 6–20)
CALCIUM: 7.8 mg/dL — AB (ref 8.9–10.3)
CO2: 28 mmol/L (ref 22–32)
Chloride: 109 mmol/L (ref 101–111)
Creatinine, Ser: 0.65 mg/dL (ref 0.44–1.00)
GFR calc non Af Amer: >60 mL/min (ref >60)
Glucose, Bld: 140 mg/dL — ABNORMAL HIGH (ref 65–99)
POTASSIUM: 4.6 mmol/L (ref 3.5–5.1)
Sodium: 142 mmol/L (ref 135–145)

## 2015-01-12 LAB — PROTIME-INR
INR: 1.24 (ref 0.00–1.49)
Prothrombin Time: 15.8 seconds — ABNORMAL HIGH (ref 11.6–15.2)

## 2015-01-12 LAB — AMMONIA: AMMONIA: 37 umol/L — AB (ref 9–35)

## 2015-01-12 MED ORDER — POTASSIUM CHLORIDE 2 MEQ/ML IV SOLN
INTRAVENOUS | Status: DC
  Administered 2015-01-12 – 2015-01-13 (×3): via INTRAVENOUS
  Filled 2015-01-12 (×7): qty 1000

## 2015-01-12 MED ORDER — WHITE PETROLATUM GEL
Status: AC
  Administered 2015-01-12: 22:00:00
  Filled 2015-01-12: qty 1

## 2015-01-12 MED ORDER — SODIUM CHLORIDE 0.9 % IV SOLN
Freq: Once | INTRAVENOUS | Status: AC
  Administered 2015-01-12: 18:00:00 via INTRAVENOUS

## 2015-01-12 MED ORDER — POTASSIUM CHLORIDE 2 MEQ/ML IV SOLN
INTRAVENOUS | Status: DC
  Administered 2015-01-12: 18:00:00 via INTRAVENOUS
  Filled 2015-01-12: qty 1000

## 2015-01-12 NOTE — Progress Notes (Signed)
Physical Therapy Treatment Patient Details Name: Amber Fowler MRN: [id number] DOB: 1918/10/11 Today's Date: 01/12/2015    History of Present Illness pt is a 81 y/o female With past medical history of hypothyroidism, hypertension and Alzheimer's disease who  present after fall with L hip and humeral fx's, s/p L hemiarthroplasty and immobilized L arm in a sling.  Pt has not been able to arouse post surgery.    PT Comments    Not aroused yet.  Keeping pt's limbs ranged, including left side where appropriate, so pt ready to progress when she can participate.  Follow Up Recommendations  SNF;Supervision/Assistance - 24 hour     Equipment Recommendations  Other (comment) (TBA next venue)    Recommendations for Other Services       Precautions / Restrictions Precautions Precautions: Posterior Hip Precaution Comments: L UE in sling at all times Required Braces or Orthoses: Knee Immobilizer - Left;Sling Knee Immobilizer - Left:  (in bed) Restrictions Weight Bearing Restrictions: Yes LUE Weight Bearing: Non weight bearing LLE Weight Bearing: Weight bearing as tolerated    Mobility  Bed Mobility Overal bed mobility: Needs Assistance;+2 for physical assistance Bed Mobility: Supine to Sit     Supine to sit: Total assist;+2 for physical assistance     General bed mobility comments: pt acknowleging with grimacing and moaning that is aware of the movement  Transfers Overall transfer level: Needs assistance   Transfers: Stand Pivot Transfers Sit to Stand: Total assist;+2 physical assistance Stand pivot transfers: Total assist;+2 physical assistance;+2 safety/equipment       General transfer comment: leaned into therapist, but otherwise no attempt to assist  Ambulation/Gait             General Gait Details: unable today   Stairs            Wheelchair Mobility    Modified Rankin (Stroke Patients Only)       Balance Overall balance assessment: Needs  assistance Sitting-balance support: No upper extremity supported;Single extremity supported Sitting balance-Leahy Scale: Fair Sitting balance - Comments: more stable EOB, starting to move more EOB                            Cognition Arousal/Alertness: Lethargic Behavior During Therapy: Flat affect Overall Cognitive Status: Difficult to assess                 General Comments: Pt with decreased arousal.  keeps eyes closed.  Follows no commands and makes little attempt to assist     Exercises General Exercises - Lower Extremity Heel Slides: AAROM;Both;10 reps;Supine Hip ABduction/ADduction: AAROM;PROM;Both;10 reps;Supine Other Exercises Other Exercises: general PROM to bil LE's R wrist, elbow and shd. and  left wrist/elbow before straightening in the sling.    General Comments General comments (skin integrity, edema, etc.): manual BP in sitting 92/50, sitting in the chair 97/46  low maps.      Pertinent Vitals/Pain Pain Assessment: Faces Faces Pain Scale: Hurts even more Pain Location: L LE Pain Descriptors / Indicators: Grimacing Pain Intervention(s): Monitored during session    Home Living                      Prior Function            PT Goals (current goals can now be found in the care plan section) Acute Rehab PT Goals Patient Stated Goal: unable PT Goal Formulation: Patient unable to participate  in goal setting Time For Goal Achievement: 01/25/15 Potential to Achieve Goals: Fair Progress towards PT goals: Not progressing toward goals - comment (keeping her ready to move for when she arouses.)    Frequency  Min 3X/week    PT Plan Current plan remains appropriate    Co-evaluation             End of Session   Activity Tolerance: Patient tolerated treatment well;Other (comment) (not participative yet.) Patient left: in chair;with call bell/phone within reach;with nursing/sitter in room;Other (comment) (on lift pad.)      Time: 3735-7897 PT Time Calculation (min) (ACUTE ONLY): 34 min  Charges:  $Therapeutic Exercise: 8-22 mins $Therapeutic Activity: 8-22 mins                    G Codes:      Tonie Elsey, Tessie Fass 01/12/2015, 12:00 PM 01/12/2015  Donnella Sham, [place] [phone]  (pager)

## 2015-01-12 NOTE — Progress Notes (Signed)
No change this am When i manipulated her arm and leg she said "too Old" Leg lengths ok Knee immobilizer on

## 2015-01-12 NOTE — Discharge Instructions (Addendum)
Information on my medicine - XARELTO (Rivaroxaban)  This medication education was reviewed with me or my healthcare representative as part of my discharge preparation.  The pharmacist that spoke with me during my hospital stay was:  Beverlee Nims, [facility]  Why was Xarelto prescribed for you? Xarelto was prescribed for you to reduce the risk of blood clots forming after orthopedic surgery. The medical term for these abnormal blood clots is venous thromboembolism (VTE).  What do you need to know about xarelto ? Take your Xarelto ONCE DAILY at the same time every day. You may take it either with or without food.  If you have difficulty swallowing the tablet whole, you may crush it and mix in applesauce just prior to taking your dose.  Take Xarelto exactly as prescribed by your doctor and DO NOT stop taking Xarelto without talking to the doctor who prescribed the medication.  Stopping without other VTE prevention medication to take the place of Xarelto may increase your risk of developing a clot.  After discharge, you should have regular check-up appointments with your healthcare provider that is prescribing your Xarelto.    What do you do if you miss a dose? If you miss a dose, take it as soon as you remember on the same day then continue your regularly scheduled once daily regimen the next day. Do not take two doses of Xarelto on the same day.   Important Safety Information A possible side effect of Xarelto is bleeding. You should call your healthcare provider right away if you experience any of the following: ? Bleeding from an injury or your nose that does not stop. ? Unusual colored urine (red or dark brown) or unusual colored stools (red or black). ? Unusual bruising for unknown reasons. ? A serious fall or if you hit your head (even if there is no bleeding).  Some medicines may interact with Xarelto and might increase your risk of bleeding while on Xarelto. To help avoid  this, consult your healthcare provider or pharmacist prior to using any new prescription or non-prescription medications, including herbals, vitamins, non-steroidal anti-inflammatory drugs (NSAIDs) and supplements.  This website has more information on Xarelto: [email or web address]-benson.[email or web address]  Weightbearing as tolerated left hip Okay to weight-bear through left arm when using a walker Used posterior hip precautions during physical therapy Range of motion left elbow and shoulder as tolerated

## 2015-01-12 NOTE — Progress Notes (Signed)
Patient output for shift only 200cc. Patient bladder scanned and shows 25cc. Dr. Dyann Kief paged. New order for 500 cc bolus received. Will administer and continue to monitor patient.

## 2015-01-12 NOTE — Progress Notes (Addendum)
[facility] PROGRESS NOTE  Amber Fowler XIP:[id number] DOB: 1918-12-31 DOA: 01/09/2015 PCP: No primary care provider on file.  Assessment/Plan: 1-left hip fracture and left humerus fracture: secondary to mechanical fall -patient is moderate to high risk for surgery but not further workup needed for optimization -she was very functional prior to admission at baseline and living in independent setting -Post operative rec's per orthopedic service  -will follow clinical response and continue supportive care -will need SNF at discharge  2-HTN: patient BP severely elevated on admission; pain most likely contributing -continue lisinopril and follow VS -might need further antihypertensive adjustment and low sodium diet  3-hypothyroidism -TSH and free T4 WNL -continue synthroid at current dose  4-leukocytosis -appears to be secondary to demargination -continue holding abx's for now and will follow trend -WBC this am 10.2  5-Alzheimer disease: mild according to family reports on admission and very functional -will monitor closely for sundowning -minimize narcotics, in fact will avoid IV narcotic pain meds if possible -continue supportive care -will check ammonia level  6-ABLA: Hgb 8.2 -will add niferex BID -follow Hgb and transfuse if less than 7  Code Status: DNR Family Communication: no family at bedside Disposition Plan: to be determine; but most likely will require SNF for rehab   Consultants:  Dr. Marlou Sa (orthopedic service)  Procedures:  S/P left hemiarthroplasty and left humerus fx fixation 6/15  Antibiotics:  None   HPI/Subjective: Afebrile, with stable vital signs. Unable to follow commands or have purposeful PO intake.   Objective: Filed Vitals:   01/12/15 1558  BP: 90/48  Pulse:   Temp: 98.6 F (37 C)  Resp: 15    Intake/Output Summary (Last 24 hours) at 01/12/15 1708 Last data filed at 01/12/15 1600  Gross per 24 hour  Intake   1575 ml   Output    650 ml  Net    925 ml   Filed Weights   01/10/15 0526 01/11/15 0013 01/12/15 0500  Weight: 51.8 kg (114 lb 3.2 oz) 56 kg (123 lb 7.3 oz) 56.3 kg (124 lb 1.9 oz)    Exam:   General:  Afebrile, continue to be lethargic; but able to say some words and answer intermittently yes/no, stable vital signs but still having difficulties following commands.   Cardiovascular: S1 and S2, no rubs or gallops  Respiratory: CTA bilaterally, no rales or wheezing on exam  Abdomen: soft, NT, ND, positive BS  Musculoskeletal: no cyanosis or clubbing, has sling on LUE and leg immobilizer on her LLE  Data Reviewed: Basic Metabolic Panel:  Recent Labs Lab 01/09/15 1218 01/10/15 0658 01/11/15 0340 01/12/15 0247  NA 142 141 141 142  K 3.8 3.9 4.5 4.6  CL 106 105 108 109  CO2 27 25 27 28   GLUCOSE 126* 121* 147* 140*  BUN 10 17 24* 19  CREATININE 0.52 0.78 0.76 0.65  CALCIUM 9.3 8.5* 7.7* 7.8*   CBC:  Recent Labs Lab 01/09/15 1218 01/10/15 0658 01/11/15 0340 01/12/15 0247  WBC 9.1 14.3* 10.8* 10.2  NEUTROABS 7.3  --   --   --   HGB 15.4* 12.1 8.8* 8.2*  HCT 45.8 36.2 26.6* 25.1*  MCV 92.0 92.3 93.7 94.0  PLT 134* 132* 102* 95*   BNP (last 3 results)  Recent Labs  01/09/15 1218  BNP 251.9*   CBG:  Recent Labs Lab 01/12/15 1622  GLUCAP 115*    Recent Results (from the past 240 hour(s))  Surgical pcr screen  Status: None   Collection Time: 01/10/15  4:15 AM  Result Value Ref Range Status   MRSA, PCR NEGATIVE NEGATIVE Final   Staphylococcus aureus NEGATIVE NEGATIVE Final    Comment:        The Xpert SA Assay (FDA approved for NASAL specimens in patients over 39 years of age), is one component of a comprehensive surveillance program.  Test performance has been validated by [facility] for patients greater than or equal to 70 year old. It is not intended to diagnose infection nor to guide or monitor treatment.      Studies: Pelvis  Portable  01/11/2015   CLINICAL DATA:  Status post left hip arthroplasty. Initial encounter.  EXAM: PORTABLE PELVIS 1-2 VIEWS  COMPARISON:  Left hip radiographs performed 01/09/2015  FINDINGS: There has been interval placement of a left hip hemiarthroplasty, noted in expected alignment, without evidence of loosening. No new fractures are seen. Overlying postoperative soft tissue air and skin staples are seen.  The right hip joint is grossly unremarkable in appearance. The visualized bowel gas pattern is within normal limits.  IMPRESSION: Interval placement of left hip hemiarthroplasty, noted in expected alignment, without evidence of loosening. No new fracture seen.   Electronically Signed   By: Garald Balding M.D.   On: 01/11/2015 01:56   Dg Humerus Left  01/11/2015   CLINICAL DATA:  Internal fixation of left humerus fracture. Initial encounter.  EXAM: LEFT HUMERUS - 2+ VIEW  COMPARISON:  Left shoulder radiographs performed 01/09/2015  FINDINGS: The patient is status post internal fixation of the fracture through the mid diaphysis of the left humerus, seen in essentially anatomic alignment, with associated plate and screws. No new fractures are seen. Overlying skin staples are noted. Scattered soft tissue air is seen.  There is chronic degenerative change about the left glenohumeral joint and at the left acromioclavicular joint. The visualized portions of the left lung are grossly clear.  IMPRESSION: Status post internal fixation of the fracture through the mid diaphysis of the left humerus, seen in essentially anatomic alignment. No new fracture seen.   Electronically Signed   By: Garald Balding M.D.   On: 01/11/2015 01:57   Dg Humerus Left  01/10/2015   CLINICAL DATA:  Left humerus fracture.  EXAM: LEFT HUMERUS - 2+ VIEW; DG C-ARM 61-120 MIN  FLUOROSCOPY TIME:  20 seconds.  COMPARISON:  Radiographs dated 01/09/2015  FINDINGS: AP and lateral C-arm images demonstrate the patient has undergone open reduction  and internal fixation of the spiral fracture of the midshaft of the left humerus. Side plate and multiple screws have been inserted. Alignment and position is essentially anatomic.  IMPRESSION: Open reduction and internal fixation of left humerus fracture.   Electronically Signed   By: Lorriane Shire M.D.   On: 01/10/2015 20:24   Dg C-arm 61-120 Min  01/10/2015   CLINICAL DATA:  Left humerus fracture.  EXAM: LEFT HUMERUS - 2+ VIEW; DG C-ARM 61-120 MIN  FLUOROSCOPY TIME:  20 seconds.  COMPARISON:  Radiographs dated 01/09/2015  FINDINGS: AP and lateral C-arm images demonstrate the patient has undergone open reduction and internal fixation of the spiral fracture of the midshaft of the left humerus. Side plate and multiple screws have been inserted. Alignment and position is essentially anatomic.  IMPRESSION: Open reduction and internal fixation of left humerus fracture.   Electronically Signed   By: Lorriane Shire M.D.   On: 01/10/2015 20:24    Scheduled Meds: . antiseptic oral rinse  7 mL Mouth Rinse BID  . atorvastatin  20 mg Oral QHS  . escitalopram  10 mg Oral QPC lunch  . iron polysaccharides  150 mg Oral BID  . levothyroxine  50 mcg Oral QAC breakfast  . lisinopril  10 mg Oral QPC lunch  . rivaroxaban  10 mg Oral Daily  . timolol  1 drop Both Eyes QPC lunch   Continuous Infusions: . 0.9 % NaCl with KCl 20 mEq / L 75 mL/hr at 01/12/15 1241  . lactated ringers 10 mL/hr at 01/10/15 1627    Principal Problem:   Left displaced femoral neck fracture Active Problems:   Hypertension   Hypothyroid   Alzheimer disease   Hip fracture requiring operative repair    Time spent: 64 minutes    [name]  [facility] Pager [phone]. If 7PM-7AM, please contact night-coverage at [email or web address], password [facility] 01/12/2015, 5:08 PM  LOS: 3 days

## 2015-01-13 DIAGNOSIS — S72001D Fracture of unspecified part of neck of right femur, subsequent encounter for closed fracture with routine healing: Secondary | ICD-10-CM

## 2015-01-13 LAB — CBC
HEMATOCRIT: 23.2 % — AB (ref 36.0–46.0)
HEMOGLOBIN: 7.5 g/dL — AB (ref 12.0–15.0)
MCH: 30.2 pg (ref 26.0–34.0)
MCHC: 32.3 g/dL (ref 30.0–36.0)
MCV: 93.5 fL (ref 78.0–100.0)
Platelets: 118 10*3/uL — ABNORMAL LOW (ref 150–400)
RBC: 2.48 MIL/uL — ABNORMAL LOW (ref 3.87–5.11)
RDW: 15.2 % (ref 11.5–15.5)
WBC: 9.8 10*3/uL (ref 4.0–10.5)

## 2015-01-13 LAB — PROTIME-INR
INR: 1.21 (ref 0.00–1.49)
Prothrombin Time: 15.4 seconds — ABNORMAL HIGH (ref 11.6–15.2)

## 2015-01-13 MED ORDER — NALOXONE HCL 0.4 MG/ML IJ SOLN: 0.2000 mg | INTRAMUSCULAR | Status: DC | PRN

## 2015-01-13 NOTE — Progress Notes (Addendum)
[facility] PROGRESS NOTE  Amber Fowler VZD:[id number] DOB: [birth year] DOA: 01/09/2015 PCP: No primary care provider on file.  Assessment/Plan: 1-left hip fracture and left humerus fracture: secondary to mechanical fall -she was very functional prior to admission at baseline and living in independent setting -Post operative rec's per orthopedic service  -will follow clinical response and continue supportive care -will need SNF at discharge -has remained sleepy/lethargic after surgery (most likely due to sedation); slowly improving  2-HTN: patient BP severely elevated on admission; pain most likely contributing -continue lisinopril and follow VS -might need further antihypertensive adjustment and low sodium diet  3-hypothyroidism -TSH and free T4 WNL -continue synthroid at current dose  4-leukocytosis -appears to be secondary to demargination -continue holding abx's for now and will follow trend -WBC this am 9.8  5-Alzheimer disease: mild according to family reports on admission and very functional -will monitor closely for sundowning -minimize narcotics, in fact will avoid IV narcotic pain meds if possible -continue supportive care -ammonia level normal  6-ABLA: Hgb 7.5 -will add niferex BID -follow Hgb and transfuse if less than 7.0 -patient receiving IVF's and might have some dilution component   Code Status: DNR Family Communication: no family at bedside Disposition Plan: to be determine; but most likely will require SNF for rehab   Consultants:  Dr. Marlou Sa (orthopedic service)  Speech therapy   Procedures:  S/P left hemiarthroplasty and left humerus fx fixation 6/15  Antibiotics:  None   HPI/Subjective: Afebrile, slightly more alert and able to follow simple commands.   Objective: Filed Vitals:   01/13/15 1132  BP: 149/53  Pulse: 76  Temp: 98.7 F (37.1 C)  Resp: 19    Intake/Output Summary (Last 24 hours) at 01/13/15 1640 Last data  filed at 01/13/15 1235  Gross per 24 hour  Intake 1816.25 ml  Output    500 ml  Net 1316.25 ml   Filed Weights   01/11/15 0013 01/12/15 0500 01/13/15 0335  Weight: 56 kg (123 lb 7.3 oz) 56.3 kg (124 lb 1.9 oz) 56.6 kg (124 lb 12.5 oz)    Exam:   General:  Afebrile, patient is more alert today, and was able to follow some simple commands. Speech therapy has recommended dysphagia 1 and thin liquids   Cardiovascular: S1 and S2, no rubs or gallops  Respiratory: CTA bilaterally, no rales or wheezing on exam  Abdomen: soft, NT, ND, positive BS  Musculoskeletal: no cyanosis or clubbing, has sling on LUE and leg immobilizer on her LLE  Data Reviewed: Basic Metabolic Panel:  Recent Labs Lab 01/09/15 1218 01/10/15 0658 01/11/15 0340 01/12/15 0247  NA 142 141 141 142  K 3.8 3.9 4.5 4.6  CL 106 105 108 109  CO2 27 25 27 28   GLUCOSE 126* 121* 147* 140*  BUN 10 17 24* 19  CREATININE 0.52 0.78 0.76 0.65  CALCIUM 9.3 8.5* 7.7* 7.8*   CBC:  Recent Labs Lab 01/09/15 1218 01/10/15 0658 01/11/15 0340 01/12/15 0247 01/13/15 0255  WBC 9.1 14.3* 10.8* 10.2 9.8  NEUTROABS 7.3  --   --   --   --   HGB 15.4* 12.1 8.8* 8.2* 7.5*  HCT 45.8 36.2 26.6* 25.1* 23.2*  MCV 92.0 92.3 93.7 94.0 93.5  PLT 134* 132* 102* 95* 118*   BNP (last 3 results)  Recent Labs  01/09/15 1218  BNP 251.9*   CBG:  Recent Labs Lab 01/12/15 1622  GLUCAP 115*    Recent Results (from the past  240 hour(s))  Surgical pcr screen     Status: None   Collection Time: 01/10/15  4:15 AM  Result Value Ref Range Status   MRSA, PCR NEGATIVE NEGATIVE Final   Staphylococcus aureus NEGATIVE NEGATIVE Final    Comment:        The Xpert SA Assay (FDA approved for NASAL specimens in patients over 44 years of age), is one component of a comprehensive surveillance program.  Test performance has been validated by [facility] for patients greater than or equal to 60 year old. It is not intended to diagnose  infection nor to guide or monitor treatment.      Studies: No results found.  Scheduled Meds: . antiseptic oral rinse  7 mL Mouth Rinse BID  . atorvastatin  20 mg Oral QHS  . escitalopram  10 mg Oral QPC lunch  . iron polysaccharides  150 mg Oral BID  . levothyroxine  50 mcg Oral QAC breakfast  . lisinopril  10 mg Oral QPC lunch  . rivaroxaban  10 mg Oral Daily  . timolol  1 drop Both Eyes QPC lunch   Continuous Infusions: . dextrose 5 % and 0.45% NaCl 1,000 mL with potassium chloride 40 mEq infusion 75 mL/hr at 01/13/15 1057  . lactated ringers 10 mL/hr at 01/10/15 1627    Principal Problem:   Left displaced femoral neck fracture Active Problems:   Hypertension   Hypothyroid   Alzheimer disease   Hip fracture requiring operative repair    Time spent: 79 minutes    [name]  [facility] Pager [phone]. If 7PM-7AM, please contact night-coverage at [email or web address], password [facility] 01/13/2015, 4:40 PM  LOS: 4 days

## 2015-01-13 NOTE — Evaluation (Signed)
Clinical/Bedside Swallow Evaluation Patient Details  Name: Amber Fowler MRN: [id number] Date of Birth: 11-24-1918  Today's Date: 01/13/2015 Time:        Past Medical History:  Past Medical History  Diagnosis Date  . Hypertension   . Thyroid disease   . Hypothyroid   . Osteoarthritis   . Cancer of right breast   . Alzheimer disease     /notes 01/09/2015   Past Surgical History:  Past Surgical History  Procedure Laterality Date  . Mastectomy Right 2003    [name] 10/01/2010  . Hip arthroplasty Left 01/10/2015    Procedure: ARTHROPLASTY BIPOLAR HIP (HEMIARTHROPLASTY);  Surgeon: [name], MD;  Location: [facility];  Service: Orthopedics;  Laterality: Left;  . Orif humerus fracture Left 01/10/2015    Procedure: OPEN REDUCTION INTERNAL FIXATION (ORIF) HUMERAL SHAFT FRACTURE;  Surgeon: [name], MD;  Location: [facility];  Service: Orthopedics;  Laterality: Left;   HPI:  81 y.o. Female With past medical history of hypothyroidism, hypertension and Alzheimer's disease who was previously from out of town and has been recently moved up here to [place] under the care of her son who is also a Engineer, drilling. Patient was brought in from independent living by EMS on 01/09/15 after she was found by staff. Patient had an unwitnessed fall and was down for unknown length of time. Patient was found to have some twisting of her left arm as well as inward rotation and shortening of her left leg. She herself has intermittent confusion.  Nursing stated she consumed liquids via straw and meds within applesauce without difficulty previously, but does not have a diet order at present.   Assessment / Plan / Recommendation Clinical Impression   Pt exhibited oral holding (d/t cognition most likely)and multiple swallows with larger volumes of liquids, but no overt s/s of aspiration during BSE with puree and thin liquids via cup/straw with verbal cues to swallow; pt has pain with mastication intermittently  per son, so solids were not assessed; OME limited d/t cognitive status and LOA.  Recommend Dysphagia 1/thin with increased LOA as pt is able; medication crushed with puree and diet advancement as tolerated with ST f/u 1-2x.    Aspiration Risk    Mild d/t cognition   Diet Recommendation   Dysphagia 1/thin       Other  Recommendations   n/a  Follow Up Recommendations    n/a   Frequency and Duration   ST 1-2x/wk     Pertinent Vitals/Pain WDL    SLP Swallow Goals  See POC   Swallow Study Prior Functional Status   Independent living     General Date of Onset: 01/09/15 Other Pertinent Information: 81 y.o. female  Type of Study: Bedside swallow evaluation Diet Prior to this Study: Other (Comment) (no diet per nursing; advance as tolerated) Temperature Spikes Noted: No Respiratory Status: Room air Behavior/Cognition: Cooperative;Confused;Lethargic/Drowsy Oral Cavity - Dentition: Missing dentition Self-Feeding Abilities: Needs assist;Needs set up Patient Positioning: Upright in bed Baseline Vocal Quality: Low vocal intensity Volitional Cough: Cognitively unable to elicit Volitional Swallow: Unable to elicit    Oral/Motor/Sensory Function   Limited assessment performed  Ice Chips   n/a  Thin Liquid   oral holding/multiple swallows with larger volumes   Nectar Thick   n/t  Honey Thick   n/t  Puree   oral holding; suspected delayed swallow (likely cognitive related)  Solid        n/a      Dreshaun Stene,PAT, M.S.,  CCC-SLP 01/13/2015,3:59 PM

## 2015-01-14 LAB — CBC
HEMATOCRIT: 21.9 % — AB (ref 36.0–46.0)
HEMOGLOBIN: 7 g/dL — AB (ref 12.0–15.0)
MCH: 30 pg (ref 26.0–34.0)
MCHC: 32 g/dL (ref 30.0–36.0)
MCV: 94 fL (ref 78.0–100.0)
Platelets: 130 10*3/uL — ABNORMAL LOW (ref 150–400)
RBC: 2.33 MIL/uL — ABNORMAL LOW (ref 3.87–5.11)
RDW: 15.3 % (ref 11.5–15.5)
WBC: 9 10*3/uL (ref 4.0–10.5)

## 2015-01-14 LAB — PREPARE RBC (CROSSMATCH)

## 2015-01-14 LAB — PROTIME-INR
INR: 1.17 (ref 0.00–1.49)
PROTHROMBIN TIME: 15.1 s (ref 11.6–15.2)

## 2015-01-14 MED ORDER — FUROSEMIDE 10 MG/ML IJ SOLN
20.0000 mg | Freq: Once | INTRAMUSCULAR | Status: AC
  Administered 2015-01-14: 20 mg via INTRAVENOUS
  Filled 2015-01-14: qty 2

## 2015-01-14 MED ORDER — SODIUM CHLORIDE 0.9 % IV SOLN: Freq: Once | INTRAVENOUS | Status: DC

## 2015-01-14 NOTE — Care Management Note (Signed)
Case Management Note  Patient Details  Name: MERIDEE BRANUM MRN: [id number] Date of Birth: 1918-09-23  Subjective/Objective:                   S/P left hemiarthroplasty and left humerus fx fixation 6/15  Action/Plan: Discharge planning   Expected Discharge Date:                  Expected Discharge Plan:  [place]  In-House Referral:     Discharge planning Services  CM Consult  Post Acute Care Choice:    Choice offered to:     DME Arranged:    DME Agency:     HH Arranged:    [place] Agency:     Status of Service:  In process, will continue to follow  Medicare Important Message Given:    Date Medicare IM Given:  01/14/15 Medicare IM give by:  Marcy Panning, RN Date Additional Medicare IM Given:    Additional Medicare Important Message give by:     If discussed at [place] of Stay Meetings, dates discussed:    Additional Comments: PT/OT to evaluate; per MD note, discharge disposition pending but likely d/c to SNF. Medicare IM given to patient at the bedside. CM will continue to monitor for ongoing CM needs.  Guido Sander, RN 01/14/2015, 5:48 PM

## 2015-01-14 NOTE — Progress Notes (Signed)
[facility] PROGRESS NOTE  ARISTEA POSADA ACZ:[id number] DOB: 1918/08/31 DOA: 01/09/2015 PCP: No primary care provider on file.  Assessment/Plan: 1-left hip fracture and left humerus fracture: secondary to mechanical fall -she was very functional prior to admission at baseline and living in independent setting -Post operative rec's per orthopedic service  -will follow clinical response and continue supportive care -will need SNF at discharge -patient is more alert today; (most likely due to sedation); slowly improving  2-HTN: patient BP severely elevated on admission; pain most likely contributing -continue lisinopril and follow VS -BP is now stable and well controlled   3-hypothyroidism -TSH and free T4 WNL -continue synthroid at current dose  4-leukocytosis -appears to be secondary to demargination -no need for abx's at this moment  -WBC this am 9.0  5-Alzheimer disease: mild according to family reports on admission and very functional (was living independently) -will continue minimizing the use of narcotics and avoid IV narcotics if possible -continue supportive care -ammonia level essentially normal  6-ABLA: Hgb 7.0 -will continue niferex BID -will go ahead and transfuse 2 units of PRBC's -patient receiving IVF's and might have some dilution component  -will follow Hgb trend  Code Status: DNR Family Communication: no family at bedside Disposition Plan: to be determine; but most likely will require SNF for rehab   Consultants:  Dr. Marlou Sa (orthopedic service)  Speech therapy   Procedures:  S/P left hemiarthroplasty and left humerus fx fixation 6/15  Antibiotics:  None   HPI/Subjective: Afebrile, normal WBC's. More alert and able to follow commands. Expressed no for CP and SOB.   Objective: Filed Vitals:   01/14/15 0459  BP: 138/64  Pulse: 63  Temp: 98.3 F (36.8 C)  Resp: 16    Intake/Output Summary (Last 24 hours) at 01/14/15 1526 Last  data filed at 01/14/15 1023  Gross per 24 hour  Intake    300 ml  Output    400 ml  Net   -100 ml   Filed Weights   01/12/15 0500 01/13/15 0335 01/14/15 0500  Weight: 56.3 kg (124 lb 1.9 oz) 56.6 kg (124 lb 12.5 oz) 62.3 kg (137 lb 5.6 oz)    Exam:   General:  Afebrile, patient continue to be more alert and now able to follow simple commands and answer yes/no questions. Still confuse but better. Speech therapy has recommended dysphagia 1 and thin liquids.  Cardiovascular: S1 and S2, no rubs or gallops  Respiratory: CTA bilaterally, no rales or wheezing on exam  Abdomen: soft, NT, ND, positive BS  Musculoskeletal: no cyanosis or clubbing, has sling on LUE and leg immobilizer on her LLE; slight overall swelling appreciated (most likely from IVF's)  Data Reviewed: Basic Metabolic Panel:  Recent Labs Lab 01/09/15 1218 01/10/15 0658 01/11/15 0340 01/12/15 0247  NA 142 141 141 142  K 3.8 3.9 4.5 4.6  CL 106 105 108 109  CO2 27 25 27 28   GLUCOSE 126* 121* 147* 140*  BUN 10 17 24* 19  CREATININE 0.52 0.78 0.76 0.65  CALCIUM 9.3 8.5* 7.7* 7.8*   CBC:  Recent Labs Lab 01/09/15 1218 01/10/15 0658 01/11/15 0340 01/12/15 0247 01/13/15 0255 01/14/15 0511  WBC 9.1 14.3* 10.8* 10.2 9.8 9.0  NEUTROABS 7.3  --   --   --   --   --   HGB 15.4* 12.1 8.8* 8.2* 7.5* 7.0*  HCT 45.8 36.2 26.6* 25.1* 23.2* 21.9*  MCV 92.0 92.3 93.7 94.0 93.5 94.0  PLT 134* 132*  102* 95* 118* 130*   BNP (last 3 results)  Recent Labs  01/09/15 1218  BNP 251.9*   CBG:  Recent Labs Lab 01/12/15 1622  GLUCAP 115*    Recent Results (from the past 240 hour(s))  Surgical pcr screen     Status: None   Collection Time: 01/10/15  4:15 AM  Result Value Ref Range Status   MRSA, PCR NEGATIVE NEGATIVE Final   Staphylococcus aureus NEGATIVE NEGATIVE Final    Comment:        The Xpert SA Assay (FDA approved for NASAL specimens in patients over 23 years of age), is one component of a  comprehensive surveillance program.  Test performance has been validated by [employer] for patients greater than or equal to 32 year old. It is not intended to diagnose infection nor to guide or monitor treatment.      Studies: No results found.  Scheduled Meds: . sodium chloride   Intravenous Once  . antiseptic oral rinse  7 mL Mouth Rinse BID  . atorvastatin  20 mg Oral QHS  . escitalopram  10 mg Oral QPC lunch  . furosemide  20 mg Intravenous Once  . iron polysaccharides  150 mg Oral BID  . levothyroxine  50 mcg Oral QAC breakfast  . lisinopril  10 mg Oral QPC lunch  . rivaroxaban  10 mg Oral Daily  . timolol  1 drop Both Eyes QPC lunch   Continuous Infusions: . dextrose 5 % and 0.45% NaCl 1,000 mL with potassium chloride 40 mEq infusion 75 mL/hr at 01/13/15 2135  . lactated ringers 10 mL/hr at 01/10/15 1627    Principal Problem:   Left displaced femoral neck fracture Active Problems:   Hypertension   Hypothyroid   Alzheimer disease   Hip fracture requiring operative repair   Time spent: 31 minutes   [name]  [facility] Pager [phone]. If 7PM-7AM, please contact night-coverage at [email or web address], password [facility] 01/14/2015, 3:26 PM  LOS: 5 days

## 2015-01-14 NOTE — Progress Notes (Signed)
Subjective: 4 Days Post-Op Procedure(s) (LRB): ARTHROPLASTY BIPOLAR HIP (HEMIARTHROPLASTY) (Left) OPEN REDUCTION INTERNAL FIXATION (ORIF) HUMERAL SHAFT FRACTURE (Left) Patient reports pain as 0 on 0-10 scale.    Objective: Vital signs in last 24 hours: Temp:  [98.1 F (36.7 C)-98.9 F (37.2 C)] 98.3 F (36.8 C) (06/19 0459) Pulse Rate:  [63-80] 63 (06/19 0459) Resp:  [16-19] 16 (06/19 0459) BP: (136-149)/(49-64) 138/64 mmHg (06/19 0459) SpO2:  [97 %-100 %] 100 % (06/19 0459) Weight:  [62.3 kg (137 lb 5.6 oz)] 62.3 kg (137 lb 5.6 oz) (06/19 0500)  Intake/Output from previous day: 06/18 0701 - 06/19 0700 In: 773.8 [P.O.:60; I.V.:713.8] Out: 550 [Urine:550] Intake/Output this shift: Total I/O In: -  Out: 225 [Urine:225]   Recent Labs  01/12/15 0247 01/13/15 0255 01/14/15 0511  HGB 8.2* 7.5* 7.0*    Recent Labs  01/13/15 0255 01/14/15 0511  WBC 9.8 9.0  RBC 2.48* 2.33*  HCT 23.2* 21.9*  PLT 118* 130*    Recent Labs  01/12/15 0247  NA 142  K 4.6  CL 109  CO2 28  BUN 19  CREATININE 0.65  GLUCOSE 140*  CALCIUM 7.8*    Recent Labs  01/13/15 0255 01/14/15 0511  INR 1.21 1.17    Neurologically intact  Assessment/Plan: 4 Days Post-Op Procedure(s) (LRB): ARTHROPLASTY BIPOLAR HIP (HEMIARTHROPLASTY) (Left) OPEN REDUCTION INTERNAL FIXATION (ORIF) HUMERAL SHAFT FRACTURE (Left)   Pleasant and confused but confortable. Mild swelling.   Aarya Quebedeaux C 01/14/2015, 6:57 AM

## 2015-01-15 DIAGNOSIS — D62 Acute posthemorrhagic anemia: Secondary | ICD-10-CM

## 2015-01-15 DIAGNOSIS — S42302D Unspecified fracture of shaft of humerus, left arm, subsequent encounter for fracture with routine healing: Secondary | ICD-10-CM

## 2015-01-15 LAB — BASIC METABOLIC PANEL
Anion gap: 8 (ref 5–15)
BUN: 7 mg/dL (ref 6–20)
CHLORIDE: 109 mmol/L (ref 101–111)
CO2: 24 mmol/L (ref 22–32)
Calcium: 8.2 mg/dL — ABNORMAL LOW (ref 8.9–10.3)
Creatinine, Ser: 0.53 mg/dL (ref 0.44–1.00)
GFR calc Af Amer: >60 mL/min (ref >60)
Glucose, Bld: 98 mg/dL (ref 65–99)
POTASSIUM: 3.5 mmol/L (ref 3.5–5.1)
SODIUM: 141 mmol/L (ref 135–145)

## 2015-01-15 LAB — CBC
HEMATOCRIT: 34.5 % — AB (ref 36.0–46.0)
HEMOGLOBIN: 12.1 g/dL (ref 12.0–15.0)
MCH: 30.5 pg (ref 26.0–34.0)
MCHC: 35.1 g/dL (ref 30.0–36.0)
MCV: 86.9 fL (ref 78.0–100.0)
Platelets: 166 10*3/uL (ref 150–400)
RBC: 3.97 MIL/uL (ref 3.87–5.11)
RDW: 15.1 % (ref 11.5–15.5)
WBC: 12.7 10*3/uL — ABNORMAL HIGH (ref 4.0–10.5)

## 2015-01-15 LAB — PROTIME-INR
INR: 1.17 (ref 0.00–1.49)
Prothrombin Time: 15.1 seconds (ref 11.6–15.2)

## 2015-01-15 MED ORDER — HYDROCODONE-ACETAMINOPHEN 5-325 MG PO TABS: 1.0000 | ORAL_TABLET | Freq: Three times a day (TID) | ORAL | Status: DC | PRN

## 2015-01-15 MED ORDER — RIVAROXABAN 10 MG PO TABS: 10.0000 mg | ORAL_TABLET | Freq: Every day | ORAL | Status: DC

## 2015-01-15 MED ORDER — ENSURE ENLIVE PO LIQD
237.0000 mL | Freq: Three times a day (TID) | ORAL | Status: DC
  Administered 2015-01-15 – 2015-01-16 (×3): 237 mL via ORAL

## 2015-01-15 NOTE — Progress Notes (Signed)
Patient doing well swelling following surgery she is more awake today Minimal pain with range of motion of left hip leg lengths approximately equal ankle dorsiflexion intact on the left Left arm has a little bit more pain in the expected amount of swelling EPL function is noted but is slightly weaker on the left than the right consistent with radial nerve stretch injury Offered to remove the sling but the patient wants to keep the sling on for now She is fine to weight-bear through the left arm and weight-bear as tolerated on the left leg

## 2015-01-15 NOTE — Progress Notes (Signed)
Nutrition Follow-up  DOCUMENTATION CODES:  Not applicable  INTERVENTION:  Ensure Enlive (each supplement provides 350kcal and 20 grams of protein) (TID)  Encourage adequate PO intake.   NUTRITION DIAGNOSIS:  Inadequate oral intake related to inability to eat as evidenced by NPO status;diet advanced- po intake 0%; ongoing  GOAL:  Patient will meet greater than or equal to 90% of their needs; not met  MONITOR:  PO intake, Supplement acceptance, Diet advancement, Labs, Weight trends, I & O's, Skin  REASON FOR ASSESSMENT:  Consult Assessment of nutrition requirement/status  ASSESSMENT: Amber Fowler is a 81 y.o. female  With past medical history of hypothyroidism, hypertension and Alzheimer's disease who was previously from out of town and has been recently moved up here to [place] under the care of her son who is also a Engineer, drilling. Patient was brought in from independent living by EMS after she was found by staff. Patient had an unwitnessed fall and was down for unknown length of time. Pt admitted with lt humerus and lt femur fx from [facility].  PROCEDURE (6/15): ARTHROPLASTY BIPOLAR HIP (HEMIARTHROPLASTY) OPEN REDUCTION INTERNAL FIXATION (ORIF) HUMERAL SHAFT FRACTURE  Pt is currently on a dysphagia 1 diet with thin liquids. Meal completion has been 0-25%. Pt reports having a good appetite during time of visit, however when asked questions about nutrition PTA, pt was unable to respond. Pt is agreeable to Ensure to aid in caloric and protein needs. RD to order. Pt was encouraged to eat her food at meals.  Labs and medications reviewed.   Height:  Ht Readings from Last 1 Encounters:  01/11/15 '5\' 6"'  (1.676 m)    Weight:  Wt Readings from Last 1 Encounters:  01/15/15 132 lb 7.9 oz (60.1 kg)    Ideal Body Weight:  59 kg  Wt Readings from Last 10 Encounters:  01/15/15 132 lb 7.9 oz (60.1 kg)    BMI:  Body mass index is 21.4 kg/(m^2).  Estimated  Nutritional Needs:  Kcal:  1400-1600  Protein:  55-65 grams  Fluid:  1.4-1.6 L  Skin:   (Incision on L arm and hip, non-pitting edema)  Diet Order:  DIET - DYS 1 Room service appropriate?: Yes; Fluid consistency:: Thin Diet - low sodium heart healthy  EDUCATION NEEDS:  No education needs identified at this time   Intake/Output Summary (Last 24 hours) at 01/15/15 1617 Last data filed at 01/15/15 1254  Gross per 24 hour  Intake    695 ml  Output   3275 ml  Net  -2580 ml    Last BM:  PTA  Corrin Parker, MS, RD, LDN Pager # [phone] After hours/ weekend pager # [phone]

## 2015-01-15 NOTE — Progress Notes (Signed)
[facility] PROGRESS NOTE  RUCHA WISSINGER EVO:[id number] DOB: 06-Jul-1919 DOA: 01/09/2015 PCP: No primary care provider on file.  Assessment/Plan: 1-left hip fracture and left humerus fracture: secondary to mechanical fall -she was very functional prior to admission at baseline and living in independent setting -Post operative rec's per orthopedic service  -will follow clinical response and continue supportive care -will need SNF at discharge -patient is more alert today; (most likely due to sedation); continue slowly improving  2-HTN: patient BP severely elevated on admission; pain most likely contributing -continue lisinopril and follow VS -BP is now stable  -will monitor and adjust meds as needed    3-hypothyroidism -TSH and free T4 WNL -continue synthroid at current dose  4-leukocytosis: -appears to be secondary to demargination -no need for abx's at this moment  -Will monitor   5-Alzheimer disease: mild according to family reports on admission and very functional (was living independently) -will continue minimizing the use of narcotics and avoid IV narcotics if possible -continue supportive care -ammonia level essentially normal  6-ABLA: Hgb 12 today (but post transfusion specimen) -will continue niferex BID -S/P transfusion of 2 units of PRBC's -will follow Hgb trend  Code Status: DNR Family Communication: no family at bedside Disposition Plan: to be determine; will require SNF for rehab at discharge, anticipating 6/21   Consultants:  Dr. Marlou Sa (orthopedic service)  Speech therapy   Procedures:  S/P left hemiarthroplasty and left humerus fx fixation 6/15  Antibiotics:  None   HPI/Subjective: Afebrile, normal WBC's. More alert and able to follow commands. Vital signs are stable and patient able to eat 25-50% of dysphagia 1 diet and able to tolerate PO meds  Objective: Filed Vitals:   01/15/15 1254  BP: 108/78  Pulse: 77  Temp: 98.6 F (37 C)   Resp: 18    Intake/Output Summary (Last 24 hours) at 01/15/15 1603 Last data filed at 01/15/15 1254  Gross per 24 hour  Intake    695 ml  Output   3275 ml  Net  -2580 ml   Filed Weights   01/13/15 0335 01/14/15 0500 01/15/15 0530  Weight: 56.6 kg (124 lb 12.5 oz) 62.3 kg (137 lb 5.6 oz) 60.1 kg (132 lb 7.9 oz)    Exam:   General:  Afebrile, patient continue slowly but steadily improving in her mentation, able to follow simple commands and tolerating dysphagia 1 diet with thin liquids. Hgb now stable after transfusion.   Cardiovascular: S1 and S2, no rubs or gallops  Respiratory: CTA bilaterally, no rales or wheezing on exam  Abdomen: soft, NT, ND, positive BS  Musculoskeletal: no cyanosis or clubbing, has sling on LUE and leg immobilizer on her LLE; slight overall swelling appreciated has improved.  Data Reviewed: Basic Metabolic Panel:  Recent Labs Lab 01/09/15 1218 01/10/15 0658 01/11/15 0340 01/12/15 0247 01/15/15 0545  NA 142 141 141 142 141  K 3.8 3.9 4.5 4.6 3.5  CL 106 105 108 109 109  CO2 27 25 27 28 24   GLUCOSE 126* 121* 147* 140* 98  BUN 10 17 24* 19 7  CREATININE 0.52 0.78 0.76 0.65 0.53  CALCIUM 9.3 8.5* 7.7* 7.8* 8.2*   CBC:  Recent Labs Lab 01/09/15 1218  01/11/15 0340 01/12/15 0247 01/13/15 0255 01/14/15 0511 01/15/15 0545  WBC 9.1  < > 10.8* 10.2 9.8 9.0 12.7*  NEUTROABS 7.3  --   --   --   --   --   --   HGB 15.4*  < >  8.8* 8.2* 7.5* 7.0* 12.1  HCT 45.8  < > 26.6* 25.1* 23.2* 21.9* 34.5*  MCV 92.0  < > 93.7 94.0 93.5 94.0 86.9  PLT 134*  < > 102* 95* 118* 130* 166  < > = values in this interval not displayed. BNP (last 3 results)  Recent Labs  01/09/15 1218  BNP 251.9*   CBG:  Recent Labs Lab 01/12/15 1622  GLUCAP 115*    Recent Results (from the past 240 hour(s))  Surgical pcr screen     Status: None   Collection Time: 01/10/15  4:15 AM  Result Value Ref Range Status   MRSA, PCR NEGATIVE NEGATIVE Final    Staphylococcus aureus NEGATIVE NEGATIVE Final    Comment:        The Xpert SA Assay (FDA approved for NASAL specimens in patients over 38 years of age), is one component of a comprehensive surveillance program.  Test performance has been validated by [facility] for patients greater than or equal to 29 year old. It is not intended to diagnose infection nor to guide or monitor treatment.      Studies: No results found.  Scheduled Meds: . sodium chloride   Intravenous Once  . antiseptic oral rinse  7 mL Mouth Rinse BID  . atorvastatin  20 mg Oral QHS  . escitalopram  10 mg Oral QPC lunch  . iron polysaccharides  150 mg Oral BID  . levothyroxine  50 mcg Oral QAC breakfast  . lisinopril  10 mg Oral QPC lunch  . rivaroxaban  10 mg Oral Daily  . timolol  1 drop Both Eyes QPC lunch   Continuous Infusions: . dextrose 5 % and 0.45% NaCl 1,000 mL with potassium chloride 40 mEq infusion 75 mL/hr at 01/13/15 2135  . lactated ringers 10 mL/hr at 01/10/15 1627    Principal Problem:   Left displaced femoral neck fracture Active Problems:   Hypertension   Hypothyroid   Alzheimer disease   Hip fracture requiring operative repair   Time spent: 29 minutes   [name]  [facility] Pager [phone]. If 7PM-7AM, please contact night-coverage at [email or web address], password [facility] 01/15/2015, 4:03 PM  LOS: 6 days

## 2015-01-15 NOTE — Progress Notes (Signed)
Orthopedic Tech Progress Note Patient Details:  Amber Fowler March 06, 1919 [id number]  Patient ID: Amber Fowler, female   DOB: [birth year], 81 y.o.   MRN: [id number] Pt unable to use trapeze bar patient helper  Hildred Priest 01/15/2015, 7:33 AM

## 2015-01-15 NOTE — Progress Notes (Signed)
Speech Language Pathology Treatment: Dysphagia  Patient Details Name: Amber Fowler MRN: [id number] DOB: 1919-06-30 Today's Date: 01/15/2015 Time: 2202-5427 SLP Time Calculation (min) (ACUTE ONLY): 22 min  Assessment / Plan / Recommendation Clinical Impression  Dysphagia treatment provided today for diet tolerance/ trials of advanced dysphagia 2 consistency. Pt demonstrated prolonged mastication, oral pocketing, and a delayed cough following trials of dysphagia 2 consistency, no s/s of aspiration noted with dysphagia 1/ thin liquids. Recommend continuing this diet with meds crushed in puree, full supervision during meals to aide in feeding, minimize distractions, and provide cues for pt to attend to meal. Will f/u x1 for another trial of upgraded diet- not sure whether pt is now at baseline level of functioning.    HPI Other Pertinent Information: 81 y.o. Female With past medical history of hypothyroidism, hypertension and Alzheimer's disease who was previously from out of town and has been recently moved up here to [place] under the care of her son who is also a Engineer, drilling. Patient was brought in from independent living by EMS on 01/09/15 after she was found by staff. Patient had an unwitnessed fall and was down for unknown length of time. Patient was found to have some twisting of her left arm as well as inward rotation and shortening of her left leg. She herself has intermittent confusion. Nursing stated she consumed liquids via straw and meds within applesauce without difficulty previously, but does not have a diet order at present.    Pertinent Vitals Pain Assessment: Faces Faces Pain Scale: No hurt  SLP Plan  Continue with current plan of care    Recommendations Diet recommendations: Dysphagia 1 (puree);Thin liquid Liquids provided via: Cup;Straw Medication Administration: Crushed with puree Supervision: Staff to assist with self feeding;Full supervision/cueing for compensatory  strategies Compensations: Slow rate;Small sips/bites Postural Changes and/or Swallow Maneuvers: Seated upright 90 degrees              Oral Care Recommendations: Oral care BID Follow up Recommendations: Skilled Nursing facility Plan: Continue with current plan of care    [place], Verania Salberg K, MA, CCC-SLP 01/15/2015, 11:14 AM  [phone]

## 2015-01-15 NOTE — Progress Notes (Signed)
Physical Therapy Treatment Patient Details Name: Amber Fowler MRN: [id number] DOB: 1918/08/26 Today's Date: 01/15/2015    History of Present Illness pt is a 81 y/o female With past medical history of hypothyroidism, hypertension and Alzheimer's disease who  present after fall with L hip and humeral fx's, s/p L hemiarthroplasty and immobilized L arm in a sling.  Pt has not been able to arouse post surgery.    PT Comments    Patient progressing very slowly towards PT goals. Continues to be confused but slightly more participatory during today's session. Demonstrates poor trunk control and sitting balance EOB requiring external support at times. Able to follow simple 1 step commands inconsistently. Pt appropriate for ST SNF. Will continue to follow to maximize independence.    Follow Up Recommendations  SNF;Supervision/Assistance - 24 hour     Equipment Recommendations  Other (comment)    Recommendations for Other Services       Precautions / Restrictions Precautions Precautions: Posterior Hip Precaution Comments: L UE in sling at all times Required Braces or Orthoses: Knee Immobilizer - Left;Sling Restrictions Weight Bearing Restrictions: Yes LUE Weight Bearing: Weight bearing as tolerated LLE Weight Bearing: Weight bearing as tolerated    Mobility  Bed Mobility Overal bed mobility: Needs Assistance Bed Mobility: Supine to Sit     Supine to sit: Total assist;+2 for physical assistance;HOB elevated Sit to supine: Total assist;+2 for physical assistance;HOB elevated   General bed mobility comments: Pt with difficulty assisting with transfer despite cues. Able to reach with RUE.  Transfers Overall transfer level: Needs assistance Equipment used: 2 person hand held assist Transfers: Sit to/from Stand Sit to Stand: Total assist;+2 physical assistance         General transfer comment: Pt with anterior lean in standing with hip/trunk flexion. Not able to initiate  upright.  Ambulation/Gait                 Stairs            Wheelchair Mobility    Modified Rankin (Stroke Patients Only)       Balance Overall balance assessment: Needs assistance Sitting-balance support: Feet supported;Single extremity supported Sitting balance-Leahy Scale: Fair Sitting balance - Comments: Able to sit EOB min guard -Max A due to anterior LOB forward multiple times. Manual cues for trunk extension and upright posture, able to maintain for short periods but poor trunk control noted.     Standing balance-Leahy Scale: Zero Standing balance comment: Total A of 2 for standing balance.                    Cognition Arousal/Alertness: Lethargic Behavior During Therapy: Flat affect Overall Cognitive Status: Difficult to assess Area of Impairment: Orientation;Attention;Problem solving Orientation Level: Disoriented to;Place;Time ("I fell") Current Attention Level: Focused         Problem Solving: Requires verbal cues;Requires tactile cues General Comments: Pt with questionable impaired vision? Difficulty keeping eyes opened throughout session. Able to answer minimial questions appropriately, if at all.     Exercises General Exercises - Lower Extremity Long Arc Quad: Right;10 reps;Seated;Left;AAROM    General Comments        Pertinent Vitals/Pain Faces Pain Scale: No hurt    Home Living                      Prior Function            PT Goals (current goals can now be found in the  care plan section) Progress towards PT goals: Progressing toward goals    Frequency  Min 3X/week    PT Plan Current plan remains appropriate    Co-evaluation             End of Session Equipment Utilized During Treatment: Gait belt Activity Tolerance: Patient limited by lethargy Patient left: in bed;with call bell/phone within reach;with bed alarm set     Time: 1354-1415 PT Time Calculation (min) (ACUTE ONLY): 21  min  Charges:  $Therapeutic Activity: 8-22 mins                    G Codes:      [name] 01/15/2015, 3:01 PM Wray Kearns, [place], DPT [phone]

## 2015-01-16 DIAGNOSIS — S42309A Unspecified fracture of shaft of humerus, unspecified arm, initial encounter for closed fracture: Secondary | ICD-10-CM | POA: Insufficient documentation

## 2015-01-16 DIAGNOSIS — D62 Acute posthemorrhagic anemia: Secondary | ICD-10-CM | POA: Insufficient documentation

## 2015-01-16 LAB — TYPE AND SCREEN
ABO/RH(D): O POS
ANTIBODY SCREEN: NEGATIVE
UNIT DIVISION: 0
UNIT DIVISION: 0

## 2015-01-16 LAB — CBC
HCT: 33.7 % — ABNORMAL LOW (ref 36.0–46.0)
Hemoglobin: 11.4 g/dL — ABNORMAL LOW (ref 12.0–15.0)
MCH: 30 pg (ref 26.0–34.0)
MCHC: 33.8 g/dL (ref 30.0–36.0)
MCV: 88.7 fL (ref 78.0–100.0)
PLATELETS: 206 10*3/uL (ref 150–400)
RBC: 3.8 MIL/uL — AB (ref 3.87–5.11)
RDW: 15.5 % (ref 11.5–15.5)
WBC: 10.3 10*3/uL (ref 4.0–10.5)

## 2015-01-16 LAB — PROTIME-INR
INR: 1.29 (ref 0.00–1.49)
Prothrombin Time: 16.3 seconds — ABNORMAL HIGH (ref 11.6–15.2)

## 2015-01-16 MED ORDER — POLYSACCHARIDE IRON COMPLEX 150 MG PO CAPS: 150.0000 mg | ORAL_CAPSULE | Freq: Every day | ORAL | Status: DC

## 2015-01-16 MED ORDER — ENSURE ENLIVE PO LIQD: 237.0000 mL | Freq: Three times a day (TID) | ORAL | Status: DC

## 2015-01-16 NOTE — Discharge Planning (Signed)
Patient to be discharged to [facility]. Patient's son, [name], updated regarding discharge.  Facility: [facility] RN report number: [phone] Transportation: EMS ([address]  Lubertha Sayres, [place] [phone]) and Surgical [phone])

## 2015-01-16 NOTE — Progress Notes (Signed)
Patient discharged to [facility] via [facility]. Reported called into [facility].

## 2015-01-16 NOTE — Discharge Summary (Signed)
Physician Discharge Summary  Amber Fowler WPV:[id number] DOB: 02-03-1919 DOA: 01/09/2015  PCP: No primary care provider on file.  Admit date: 01/09/2015 Discharge date: 01/16/2015  Time spent: >30 minutes  Recommendations for Outpatient Follow-up:  1. Check CBC in 5 days to follow Hgb trend 2. Check BMET in 5 days to follow electrolytes and renal function 3. Reassess BP and adjust antihypertensive regimen as needed 4. Be cautious with narcotics therapy 5. Please see below for further recommendations and follow up appointments. 6. Wound care and weight bearing as per Dr. Marlou Sa (orthopedist) recommendations.  Discharge Diagnoses:  Principal Problem:   Left displaced femoral neck fracture Active Problems:   Hypertension   Hypothyroid   Alzheimer disease   Hip fracture requiring operative repair   Discharge Condition: stable and improved. Discharge to SNF for rehabilitation and care   Diet recommendation: dysphagia 1 with thin liquids  Filed Weights   01/13/15 0335 01/14/15 0500 01/15/15 0530  Weight: 56.6 kg (124 lb 12.5 oz) 62.3 kg (137 lb 5.6 oz) 60.1 kg (132 lb 7.9 oz)    History of present illness:  81 y.o. Female With past medical history of hypothyroidism, hypertension and Alzheimer's disease who was previously from out of town and has been recently moved up here to [place] under the care of her son who is also a Engineer, drilling. Patient was brought in from independent living by EMS after she was found by staff. Patient had an unwitnessed fall and was down for unknown length of time. Patient was found to have some twisting of her left arm as well as inward rotation and shortening of her left leg. She herself has intermittent confusion and is not able to relate events of her fall. Patient was given medication for pain. X-rays done noted a left humerus fracture and femur fracture. Orthopedics was consulted. Labs were done which were unremarkable although patient's blood pressure  was noted to be elevated. [facility] called to admit patient for further evaluation and treatment.  Hospital Course:  1-left hip fracture and left humerus fracture: secondary to mechanical fall -she was very functional prior to admission at baseline and living in independent setting -Post operative rec's per orthopedic service; they will follow her up in 2 weeks -will continue supportive care -will discharge to SNF for rehab and care -patient continue to be more alert on daily basis, able to follow simple commands and is capable of tolerate dysphagia 1 and thin liquids  -xarelto for DVT prophylaxis  2-HTN: patient BP severely elevated on admission; pain most likely contributing -continue lisinopril and follow VS -BP is now stable  -will need close monitoring and further adjustments to her meds as needed  -continue heart healthy diet  3-hypothyroidism -TSH and free T4 WNL -continue synthroid at current dose  4-leukocytosis: -appears to be secondary to demargination -no need for abx's at this moment  -at discharge WNL, no fever, no dysuria and no signs of infection   5-Alzheimer disease: mild according to family reports on admission and very functional (was living independently) -will continue minimizing the use of narcotics and continue stimulation/rehab -continue supportive care -ammonia level essentially normal  6-ABLA: Hgb 11.4 at discharge -will continue niferex daily -S/P transfusion of 2 units of PRBC's during this admission -will follow Hgb trend in outpatient setting   Procedures:  S/P left hemiarthroplasty and left humerus fx fixation 6/15  Consultations:  Dr. Marlou Sa (orthopedic service)  Speech therapy  Discharge Exam: Filed Vitals:   01/16/15 0628  BP:  148/96  Pulse: 74  Temp: 98 F (36.7 C)  Resp: 18    General: Afebrile, patient continue slowly but steadily improving in her mentation, able to follow simple commands and tolerating dysphagia 1 diet with  thin liquids. Hgb stable after transfusion.   Cardiovascular: S1 and S2, no rubs or gallops  Respiratory: CTA bilaterally, no rales or wheezing on exam  Abdomen: soft, NT, ND, positive BS  Musculoskeletal: no cyanosis or clubbing, LUE with decrease range of motion and some swelling. Left leg with immobilizer in place, clean dressings and no cyanosis or edema appreciated.   Discharge Instructions   Discharge Instructions    Call MD / Call 911    Complete by:  As directed   If you experience chest pain or shortness of breath, CALL 911 and be transported to the hospital emergency room.  If you develope a fever above 101 F, pus (white drainage) or increased drainage or redness at the wound, or calf pain, call your surgeon's office.     Constipation Prevention    Complete by:  As directed   Drink plenty of fluids.  Prune juice may be helpful.  You may use a stool softener, such as Colace (over the counter) 100 mg twice a day.  Use MiraLax (over the counter) for constipation as needed.     Diet - low sodium heart healthy    Complete by:  As directed      Diet - low sodium heart healthy    Complete by:  As directed      Discharge instructions    Complete by:  As directed   Weightbearing as tolerated left hip and left arm Keep incisions dry Return to clinic 7 days     Discharge instructions    Complete by:  As directed   Dysphagia 1 diet with thin liquids Physical rehab and Speech therapy follow as per facility protocols Maintain good hydration and make sure patient is in upright position when eating to decrease risk of aspiration Heart healthy diet Repeat CBC in 5 days to follow Hgb trend Check BMET in 5 days to follow electrolytes and renal function Follow up with Orthopedic service in 2 weeks     Increase activity slowly as tolerated    Complete by:  As directed           Current Discharge Medication List    START taking these medications   Details  feeding supplement,  ENSURE ENLIVE, (ENSURE ENLIVE) LIQD Take 237 mLs by mouth 3 (three) times daily between meals. Qty: 237 mL, Refills: 12    HYDROcodone-acetaminophen (NORCO/VICODIN) 5-325 MG per tablet Take 1 tablet by mouth every 8 (eight) hours as needed for moderate pain. Qty: 30 tablet, Refills: 0    iron polysaccharides (NIFEREX) 150 MG capsule Take 1 capsule (150 mg total) by mouth daily.    rivaroxaban (XARELTO) 10 MG TABS tablet Take 1 tablet (10 mg total) by mouth daily. Qty: 12 tablet, Refills: 0      CONTINUE these medications which have NOT CHANGED   Details  atorvastatin (LIPITOR) 20 MG tablet Take 20 mg by mouth at bedtime.    cholecalciferol (VITAMIN D) 1000 UNITS tablet Take 1,000 Units by mouth at bedtime.    escitalopram (LEXAPRO) 10 MG tablet Take 10 mg by mouth daily after lunch.    levothyroxine (SYNTHROID, LEVOTHROID) 50 MCG tablet Take 50 mcg by mouth every morning.    lisinopril (PRINIVIL,ZESTRIL) 10 MG tablet Take 10 mg  by mouth daily after lunch.    Omega-3 Fatty Acids (FISH OIL) 1000 MG CAPS Take 1,000 mg by mouth at bedtime.    timolol (BETIMOL) 0.5 % ophthalmic solution Place 1 drop into both eyes daily after lunch.      STOP taking these medications     aspirin 325 MG tablet        No Known Allergies Follow-up Information    Follow up with [name], MD. Schedule an appointment as soon as possible for a visit in 2 weeks.   Specialty:  Orthopedic Surgery   Contact information:   [address] [place] [address] [phone]        The results of significant diagnostics from this hospitalization (including imaging, microbiology, ancillary and laboratory) are listed below for reference.    Significant Diagnostic Studies: Dg Chest 1 View  01/09/2015   CLINICAL DATA:  Pain following fall  EXAM: CHEST  1 VIEW  COMPARISON:  None.  FINDINGS: There is no edema or consolidation. Heart is slightly enlarged with pulmonary vascularity within  normal limits. There is atherosclerotic change in the aorta. No adenopathy. Patient is status post right mastectomy. There are surgical clips the right axillary region. Bones are somewhat osteoporotic. No pneumothorax. No rib fracture evident. There is arthropathy in each shoulder.  IMPRESSION: Mild cardiac enlargement.  No edema or consolidation.   Electronically Signed   By: Lowella Grip III M.D.   On: 01/09/2015 13:52   Ct Head Wo Contrast  01/09/2015   CLINICAL DATA:  Unwitnessed fall.  Pain.  EXAM: CT HEAD WITHOUT CONTRAST  CT CERVICAL SPINE WITHOUT CONTRAST  TECHNIQUE: Multidetector CT imaging of the head and cervical spine was performed following the standard protocol without intravenous contrast. Multiplanar CT image reconstructions of the cervical spine were also generated.  COMPARISON:  None.  FINDINGS: CT HEAD FINDINGS  There is no acute intracranial hemorrhage, acute infarction, or worrisome intracranial mass lesion. The patient does have a 15 mm calcified meningioma on the inner toe mobile the left side of the frontal bone. There is no edema in the adjacent brain tissue.  The patient has benign basal ganglia calcifications as well as white matter calcification in the left frontal lobe. Diffuse cerebral cortical and cerebellar atrophy, most prominent in the frontal and temporal lobes. No acute osseous abnormality.  CT CERVICAL SPINE FINDINGS  Congenital incomplete posterior arch of C1. No fracture or acute subluxation. No prevertebral soft tissue swelling. Severe degenerative disc disease at C3-4 and C4-5. Auto fusion of the C5 and C6 vertebra. Impending auto fusion at C6-7.  Moderately severe facet arthritis at C2-3 and C3-4 on the left. Less severe facet arthritis from C2-3 through T2-3 on the right.  2 mm anterolisthesis of C7 on T1. Disc space narrowing at T1-2 and T2-3.  IMPRESSION: 1. No acute intracranial abnormality. Atrophy. Calcified meningioma on the left frontal bone. 2. No acute  abnormality of the cervical spine. Multilevel degenerative disc and joint disease.   Electronically Signed   By: Lorriane Shire M.D.   On: 01/09/2015 13:46   Ct Cervical Spine Wo Contrast  01/09/2015   CLINICAL DATA:  Unwitnessed fall.  Pain.  EXAM: CT HEAD WITHOUT CONTRAST  CT CERVICAL SPINE WITHOUT CONTRAST  TECHNIQUE: Multidetector CT imaging of the head and cervical spine was performed following the standard protocol without intravenous contrast. Multiplanar CT image reconstructions of the cervical spine were also generated.  COMPARISON:  None.  FINDINGS: CT HEAD FINDINGS  There is no acute intracranial hemorrhage, acute infarction, or worrisome intracranial mass lesion. The patient does have a 15 mm calcified meningioma on the inner toe mobile the left side of the frontal bone. There is no edema in the adjacent brain tissue.  The patient has benign basal ganglia calcifications as well as white matter calcification in the left frontal lobe. Diffuse cerebral cortical and cerebellar atrophy, most prominent in the frontal and temporal lobes. No acute osseous abnormality.  CT CERVICAL SPINE FINDINGS  Congenital incomplete posterior arch of C1. No fracture or acute subluxation. No prevertebral soft tissue swelling. Severe degenerative disc disease at C3-4 and C4-5. Auto fusion of the C5 and C6 vertebra. Impending auto fusion at C6-7.  Moderately severe facet arthritis at C2-3 and C3-4 on the left. Less severe facet arthritis from C2-3 through T2-3 on the right.  2 mm anterolisthesis of C7 on T1. Disc space narrowing at T1-2 and T2-3.  IMPRESSION: 1. No acute intracranial abnormality. Atrophy. Calcified meningioma on the left frontal bone. 2. No acute abnormality of the cervical spine. Multilevel degenerative disc and joint disease.   Electronically Signed   By: Lorriane Shire M.D.   On: 01/09/2015 13:46   Pelvis Portable  01/11/2015   CLINICAL DATA:  Status post left hip arthroplasty. Initial encounter.  EXAM:  PORTABLE PELVIS 1-2 VIEWS  COMPARISON:  Left hip radiographs performed 01/09/2015  FINDINGS: There has been interval placement of a left hip hemiarthroplasty, noted in expected alignment, without evidence of loosening. No new fractures are seen. Overlying postoperative soft tissue air and skin staples are seen.  The right hip joint is grossly unremarkable in appearance. The visualized bowel gas pattern is within normal limits.  IMPRESSION: Interval placement of left hip hemiarthroplasty, noted in expected alignment, without evidence of loosening. No new fracture seen.   Electronically Signed   By: Garald Balding M.D.   On: 01/11/2015 01:56   Dg Shoulder Left  01/09/2015   CLINICAL DATA:  Unwitnessed fall, LEFT arm deformity  EXAM: LEFT SHOULDER - 2+ VIEW  COMPARISON:  None  FINDINGS: Marked osseous demineralization.  Mid diaphyseal fracture LEFT humerus with apex medial angulation, medial displacement, and overriding.  Advanced LEFT glenohumeral degenerative changes and probable LEFT chronic rotator cuff tear.  Level alignment inadequately assessed on this exam.  Visualized ribs intact.  No glenohumeral dislocation or AC joint malalignment identified.  IMPRESSION: Displaced angulated and overriding mid diaphyseal fracture LEFT humerus.  Degenerative changes LEFT shoulder.  Marked osseous demineralization.   Electronically Signed   By: Lavonia Dana M.D.   On: 01/09/2015 13:55   Dg Humerus Left  01/11/2015   CLINICAL DATA:  Internal fixation of left humerus fracture. Initial encounter.  EXAM: LEFT HUMERUS - 2+ VIEW  COMPARISON:  Left shoulder radiographs performed 01/09/2015  FINDINGS: The patient is status post internal fixation of the fracture through the mid diaphysis of the left humerus, seen in essentially anatomic alignment, with associated plate and screws. No new fractures are seen. Overlying skin staples are noted. Scattered soft tissue air is seen.  There is chronic degenerative change about the left  glenohumeral joint and at the left acromioclavicular joint. The visualized portions of the left lung are grossly clear.  IMPRESSION: Status post internal fixation of the fracture through the mid diaphysis of the left humerus, seen in essentially anatomic alignment. No new fracture seen.   Electronically Signed   By: Garald Balding M.D.   On: 01/11/2015 01:57   Dg  Humerus Left  01/10/2015   CLINICAL DATA:  Left humerus fracture.  EXAM: LEFT HUMERUS - 2+ VIEW; DG C-ARM 61-120 MIN  FLUOROSCOPY TIME:  20 seconds.  COMPARISON:  Radiographs dated 01/09/2015  FINDINGS: AP and lateral C-arm images demonstrate the patient has undergone open reduction and internal fixation of the spiral fracture of the midshaft of the left humerus. Side plate and multiple screws have been inserted. Alignment and position is essentially anatomic.  IMPRESSION: Open reduction and internal fixation of left humerus fracture.   Electronically Signed   By: Lorriane Shire M.D.   On: 01/10/2015 20:24   Dg C-arm 61-120 Min  01/10/2015   CLINICAL DATA:  Left humerus fracture.  EXAM: LEFT HUMERUS - 2+ VIEW; DG C-ARM 61-120 MIN  FLUOROSCOPY TIME:  20 seconds.  COMPARISON:  Radiographs dated 01/09/2015  FINDINGS: AP and lateral C-arm images demonstrate the patient has undergone open reduction and internal fixation of the spiral fracture of the midshaft of the left humerus. Side plate and multiple screws have been inserted. Alignment and position is essentially anatomic.  IMPRESSION: Open reduction and internal fixation of left humerus fracture.   Electronically Signed   By: Lorriane Shire M.D.   On: 01/10/2015 20:24   Dg Hip Unilat With Pelvis 2-3 Views Left  01/09/2015   CLINICAL DATA:  Unwitnessed fall with left leg deformity  EXAM: LEFT HIP (WITH PELVIS) 2-3 VIEWS  COMPARISON:  None.  FINDINGS: Acute left femoral neck fracture, subcapital. There is displacement with upward migration of the distal femur. No dislocation. No notable degenerative  changes to the left acetabulum. No evidence of pelvic ring fracture.  1.2 cm ovoid sclerotic focus in the intertrochanteric right femur. Noted history of breast cancer, but morphology favors a bone island.  IMPRESSION: 1. Displaced subcapital left femoral neck fracture. 2. Sclerotic focus in the proximal right femur favors a bone island.   Electronically Signed   By: Monte Fantasia M.D.   On: 01/09/2015 13:57   Dg Femur Min 2 Views Left  01/09/2015   CLINICAL DATA:  Pain following fall  EXAM: LEFT FEMUR 2 VIEWS  COMPARISON:  None.  FINDINGS: Frontal and lateral views were obtained. There is a subcapital femoral neck fracture on the left with varus angulation at the fracture site. There is mild impaction of the inferior femoral neck along the femoral head in the region of the fracture. No other fracture. No dislocation. There is moderate osteoarthritic change in the hip and knee joints. There is moderate arterial vascular calcification. No apparent knee joint effusion.  IMPRESSION: Subcapital femoral neck fracture on the left with varus angulation at the fracture site and mild impaction.   Electronically Signed   By: Lowella Grip III M.D.   On: 01/09/2015 13:53    Microbiology: Recent Results (from the past 240 hour(s))  Surgical pcr screen     Status: None   Collection Time: 01/10/15  4:15 AM  Result Value Ref Range Status   MRSA, PCR NEGATIVE NEGATIVE Final   Staphylococcus aureus NEGATIVE NEGATIVE Final    Comment:        The Xpert SA Assay (FDA approved for NASAL specimens in patients over 27 years of age), is one component of a comprehensive surveillance program.  Test performance has been validated by [facility] for patients greater than or equal to 60 year old. It is not intended to diagnose infection nor to guide or monitor treatment.      Labs: Basic Metabolic  Panel:  Recent Labs Lab 01/09/15 1218 01/10/15 0658 01/11/15 0340 01/12/15 0247 01/15/15 0545  NA 142 141  141 142 141  K 3.8 3.9 4.5 4.6 3.5  CL 106 105 108 109 109  CO2 27 25 27 28 24   GLUCOSE 126* 121* 147* 140* 98  BUN 10 17 24* 19 7  CREATININE 0.52 0.78 0.76 0.65 0.53  CALCIUM 9.3 8.5* 7.7* 7.8* 8.2*    Recent Labs Lab 01/12/15 1752  AMMONIA 37*   CBC:  Recent Labs Lab 01/09/15 1218  01/12/15 0247 01/13/15 0255 01/14/15 0511 01/15/15 0545 01/16/15 0655  WBC 9.1  < > 10.2 9.8 9.0 12.7* 10.3  NEUTROABS 7.3  --   --   --   --   --   --   HGB 15.4*  < > 8.2* 7.5* 7.0* 12.1 11.4*  HCT 45.8  < > 25.1* 23.2* 21.9* 34.5* 33.7*  MCV 92.0  < > 94.0 93.5 94.0 86.9 88.7  PLT 134*  < > 95* 118* 130* 166 206  < > = values in this interval not displayed.  BNP (last 3 results)  Recent Labs  01/09/15 1218  BNP 251.9*   CBG:  Recent Labs Lab 01/12/15 1622  GLUCAP 115*    Signed:  [name]  [facility] 01/16/2015, 10:41 AM

## 2015-01-16 NOTE — Clinical Social Work Placement (Signed)
   CLINICAL SOCIAL WORK PLACEMENT  NOTE  Date:  01/16/2015  Patient Details  Name: Amber Fowler MRN: [id number] Date of Birth: [birth year]  Clinical Social Work is seeking post-discharge placement for this patient at the [place] level of care (*CSW will initial, date and re-position this form in  chart as items are completed):  Yes   Patient/family provided with [facility] Work Department's list of facilities offering this level of care within the geographic area requested by the patient (or if unable, by the patient's family).  Yes   Patient/family informed of their freedom to choose among providers that offer the needed level of care, that participate in Medicare, Medicaid or managed care program needed by the patient, have an available bed and are willing to accept the patient.  Yes   Patient/family informed of [facility]'s ownership interest in [facility] and [facility], as well as of the fact that they are under no obligation to receive care at these facilities.  PASRR submitted to EDS on 01/10/15     PASRR number received on 01/10/15     Existing PASRR number confirmed on  (n/a)     FL2 transmitted to all facilities in geographic area requested by pt/family on 01/10/15     FL2 transmitted to all facilities within larger geographic area on  (n/a)     Patient informed that his/her managed care company has contracts with or will negotiate with certain facilities, including the following:   (yes, [facility])     Yes (Presented by previous CSW)   Patient/family informed of bed offers received.  Patient chooses bed at [facility]     Physician recommends and patient chooses bed at  (n/a)    Patient to be transferred to [facility] on 01/16/15.  Patient to be transferred to facility by [facility]     Patient family notified on 01/16/15 of transfer.  Name of family member notified:  [name], son     PHYSICIAN        Additional Comment:    _______________________________________________ Caroline Sauger, LCSW 01/16/2015, 3:09 PM [phone]

## 2015-01-16 NOTE — Progress Notes (Cosign Needed)
Pt unable to follow command of incentive spirometry teaching and use.

## 2015-01-16 NOTE — Progress Notes (Signed)
Left arm aquaseal dressing saturated through to pad and pilllow. Drainage was green, bloody, and serous in color with foul odor. Dressing re-inforced and arm elevated.

## 2016-01-11 ENCOUNTER — Emergency Department (HOSPITAL_COMMUNITY)
Admission: EM | Admit: 2016-01-11 | Discharge: 2016-01-12 | Disposition: A | Discharge status: Home / Self Care | Payer: Medicare Other | Attending: Emergency Medicine | Admitting: Emergency Medicine

## 2016-01-11 ENCOUNTER — Emergency Department (HOSPITAL_COMMUNITY): Payer: Medicare Other

## 2016-01-11 ENCOUNTER — Encounter (HOSPITAL_COMMUNITY): Payer: Self-pay | Admitting: *Deleted

## 2016-01-11 DIAGNOSIS — Y939 Activity, unspecified: Secondary | ICD-10-CM | POA: Diagnosis not present

## 2016-01-11 DIAGNOSIS — W19XXXA Unspecified fall, initial encounter: Secondary | ICD-10-CM | POA: Insufficient documentation

## 2016-01-11 DIAGNOSIS — G309 Alzheimer's disease, unspecified: Secondary | ICD-10-CM | POA: Insufficient documentation

## 2016-01-11 DIAGNOSIS — Z7901 Long term (current) use of anticoagulants: Secondary | ICD-10-CM | POA: Diagnosis not present

## 2016-01-11 DIAGNOSIS — I10 Essential (primary) hypertension: Secondary | ICD-10-CM | POA: Diagnosis not present

## 2016-01-11 DIAGNOSIS — Z79899 Other long term (current) drug therapy: Secondary | ICD-10-CM | POA: Insufficient documentation

## 2016-01-11 DIAGNOSIS — J189 Pneumonia, unspecified organism: Secondary | ICD-10-CM

## 2016-01-11 DIAGNOSIS — E785 Hyperlipidemia, unspecified: Secondary | ICD-10-CM | POA: Insufficient documentation

## 2016-01-11 DIAGNOSIS — Y999 Unspecified external cause status: Secondary | ICD-10-CM | POA: Insufficient documentation

## 2016-01-11 DIAGNOSIS — Y929 Unspecified place or not applicable: Secondary | ICD-10-CM | POA: Diagnosis not present

## 2016-01-11 DIAGNOSIS — M1990 Unspecified osteoarthritis, unspecified site: Secondary | ICD-10-CM | POA: Insufficient documentation

## 2016-01-11 DIAGNOSIS — R0602 Shortness of breath: Secondary | ICD-10-CM | POA: Diagnosis not present

## 2016-01-11 DIAGNOSIS — S2232XA Fracture of one rib, left side, initial encounter for closed fracture: Secondary | ICD-10-CM

## 2016-01-11 DIAGNOSIS — Z853 Personal history of malignant neoplasm of breast: Secondary | ICD-10-CM | POA: Insufficient documentation

## 2016-01-11 DIAGNOSIS — S2242XA Multiple fractures of ribs, left side, initial encounter for closed fracture: Secondary | ICD-10-CM | POA: Diagnosis not present

## 2016-01-11 DIAGNOSIS — Z96642 Presence of left artificial hip joint: Secondary | ICD-10-CM | POA: Diagnosis not present

## 2016-01-11 DIAGNOSIS — S299XXA Unspecified injury of thorax, initial encounter: Secondary | ICD-10-CM | POA: Diagnosis present

## 2016-01-11 HISTORY — DX: Hyperlipidemia, unspecified: E78.5

## 2016-01-11 HISTORY — DX: Anemia, unspecified: D64.9

## 2016-01-11 HISTORY — DX: Depression, unspecified: F32.A

## 2016-01-11 HISTORY — DX: Deficiency of vitamin E: E56.0

## 2016-01-11 HISTORY — DX: Traumatic subdural hemorrhage with loss of consciousness status unknown, initial encounter: S06.5XAA

## 2016-01-11 HISTORY — DX: Traumatic subdural hemorrhage with loss of consciousness of unspecified duration, initial encounter: S06.5X9A

## 2016-01-11 HISTORY — DX: Unspecified glaucoma: H40.9

## 2016-01-11 HISTORY — DX: Major depressive disorder, single episode, unspecified: F32.9

## 2016-01-11 HISTORY — DX: Spinal stenosis, lumbar region without neurogenic claudication: M48.061

## 2016-01-11 LAB — BASIC METABOLIC PANEL
Anion gap: 6 (ref 5–15)
BUN: 13 mg/dL (ref 6–20)
CALCIUM: 9.5 mg/dL (ref 8.9–10.3)
CO2: 29 mmol/L (ref 22–32)
CREATININE: 0.71 mg/dL (ref 0.44–1.00)
Chloride: 106 mmol/L (ref 101–111)
GFR calc Af Amer: >60 mL/min (ref >60)
GFR calc non Af Amer: >60 mL/min (ref >60)
GLUCOSE: 122 mg/dL — AB (ref 65–99)
Potassium: 3.8 mmol/L (ref 3.5–5.1)
Sodium: 141 mmol/L (ref 135–145)

## 2016-01-11 LAB — CBC WITH DIFFERENTIAL/PLATELET
Basophils Absolute: 0 10*3/uL (ref 0.0–0.1)
Basophils Relative: 0 %
Eosinophils Absolute: 0.1 10*3/uL (ref 0.0–0.7)
Eosinophils Relative: 1 %
HCT: 42.5 % (ref 36.0–46.0)
Hemoglobin: 13.5 g/dL (ref 12.0–15.0)
LYMPHS PCT: 21 %
Lymphs Abs: 2.4 10*3/uL (ref 0.7–4.0)
MCH: 29.8 pg (ref 26.0–34.0)
MCHC: 31.8 g/dL (ref 30.0–36.0)
MCV: 93.8 fL (ref 78.0–100.0)
MONO ABS: 2 10*3/uL — AB (ref 0.1–1.0)
MONOS PCT: 17 %
Neutro Abs: 7 10*3/uL (ref 1.7–7.7)
Neutrophils Relative %: 61 %
Platelets: 132 10*3/uL — ABNORMAL LOW (ref 150–400)
RBC: 4.53 MIL/uL (ref 3.87–5.11)
RDW: 14.6 % (ref 11.5–15.5)
WBC: 11.4 10*3/uL — ABNORMAL HIGH (ref 4.0–10.5)

## 2016-01-11 LAB — BRAIN NATRIURETIC PEPTIDE: B Natriuretic Peptide: 130.1 pg/mL — ABNORMAL HIGH (ref 0.0–100.0)

## 2016-01-11 LAB — I-STAT TROPONIN, ED: Troponin i, poc: 0 ng/mL (ref 0.00–0.08)

## 2016-01-11 MED ORDER — AMOXICILLIN-POT CLAVULANATE 875-125 MG PO TABS: 1.0000 | ORAL_TABLET | Freq: Two times a day (BID) | ORAL | Status: DC

## 2016-01-11 MED ORDER — ACETAMINOPHEN-CODEINE 300-30 MG PO TABS: 1.0000 | ORAL_TABLET | ORAL | Status: DC | PRN

## 2016-01-11 MED ORDER — HYDROCODONE-ACETAMINOPHEN 5-325 MG PO TABS
1.0000 | ORAL_TABLET | Freq: Once | ORAL | Status: AC
  Administered 2016-01-11: 1 via ORAL
  Filled 2016-01-11: qty 1

## 2016-01-11 MED ORDER — AMOXICILLIN-POT CLAVULANATE 875-125 MG PO TABS
1.0000 | ORAL_TABLET | Freq: Once | ORAL | Status: AC
  Administered 2016-01-11: 1 via ORAL
  Filled 2016-01-11: qty 1

## 2016-01-11 NOTE — ED Notes (Signed)
Pt to ED by EMS from assisted living. Pt c/o increased shortness of breath onset today. Pt did have a fall three days ago, c/o L sided arm/rib cage pain. Facility did a bedside xray which was negative for rib fractures. Mild wheezing noted to L lower lobe. EMS gave 5mg  albuterol enroute with improvement

## 2016-01-11 NOTE — ED Notes (Signed)
Patient transported to X-ray 

## 2016-01-11 NOTE — ED Provider Notes (Signed)
CSN: [id number]     Arrival date & time 01/11/16  2050 History   First MD Initiated Contact with Patient 01/11/16 2102     Chief Complaint  Patient presents with  . Shortness of Breath  . Rib Injury   HPI Patient presents with concerns of chest pain and shortness of breath. She fell 3 days ago and has left-sided rib cage pain no significant arm pain per patient. Patient had a portable chest x-ray which was negative for fracture. However she continued to have pain. Today, she developed shortness of breath and was wheezing. Her son, who is a physician, states she didn't not have any history of COPD but did note that she was wheezing. In the past, patient has been on the route but per son she is no longer on it area she has recently had a femur fracture and is not ambulatory as a result. Patient denies any fevers and states she has a mild cough that is not particularly worse now. No hemoptysis. No syncope. Does have some pain where her fracture is that this is stable.  Past Medical History  Diagnosis Date  . Hypertension   . Thyroid disease   . Hypothyroid   . Osteoarthritis   . Cancer of right breast ([employer])   . Alzheimer disease     /notes 01/09/2015  . Anemia   . Hyperlipidemia   . Glaucoma   . Vitamin E deficiency   . Depression   . Osteoarthritis   . Lumbar stenosis   . Subdural hematoma [facility])    Past Surgical History  Procedure Laterality Date  . Mastectomy Right 2003    [name] 10/01/2010  . Hip arthroplasty Left 01/10/2015    Procedure: ARTHROPLASTY BIPOLAR HIP (HEMIARTHROPLASTY);  Surgeon: [name], MD;  Location: [place];  Service: Orthopedics;  Laterality: Left;  . Orif humerus fracture Left 01/10/2015    Procedure: OPEN REDUCTION INTERNAL FIXATION (ORIF) HUMERAL SHAFT FRACTURE;  Surgeon: [name], MD;  Location: [place];  Service: Orthopedics;  Laterality: Left;   No family history on file. Social History  Substance Use Topics  . Smoking status: Never Smoker    . Smokeless tobacco: None  . Alcohol Use: No   OB History    No data available     Review of Systems  Constitutional: Negative for fever.  Respiratory: Positive for cough.   Allergic/Immunologic: Negative for immunocompromised state.  All other systems reviewed and are negative.     Allergies  Review of patient's allergies indicates no known allergies.  Home Medications   Prior to Admission medications   Medication Sig Start Date End Date Taking? Authorizing Provider  acetaminophen (TYLENOL) 325 MG tablet Take 650 mg by mouth every 6 (six) hours as needed for mild pain.   Yes Historical Provider, MD  Amino Acids-Protein Hydrolys (FEEDING SUPPLEMENT, PRO-STAT SUGAR FREE 64,) LIQD Take 30 mLs by mouth 2 (two) times daily.   Yes Historical Provider, MD  atorvastatin (LIPITOR) 20 MG tablet Take 20 mg by mouth at bedtime.   Yes Historical Provider, MD  calcium carbonate (OS-CAL - DOSED IN MG OF ELEMENTAL CALCIUM) 1250 (500 Ca) MG tablet Take 1 tablet by mouth 2 (two) times daily with a meal.   Yes Historical Provider, MD  carboxymethylcellulose 1 % ophthalmic solution Place 1 drop into both eyes 3 (three) times daily.   Yes Historical Provider, MD  Cholecalciferol (VITAMIN D) 2000 units CAPS Take 2,000 Units by mouth at bedtime.  Yes Historical Provider, MD  docusate sodium (COLACE) 100 MG capsule Take 100 mg by mouth 2 (two) times daily.   Yes Historical Provider, MD  latanoprost (XALATAN) 0.005 % ophthalmic solution Place 1 drop into both eyes at bedtime.   Yes Historical Provider, MD  levothyroxine (SYNTHROID, LEVOTHROID) 50 MCG tablet Take 50 mcg by mouth every morning.   Yes Historical Provider, MD  lisinopril (PRINIVIL,ZESTRIL) 10 MG tablet Take 10 mg by mouth daily.    Yes Historical Provider, MD  miconazole (BAZA ANTIFUNGAL) 2 % cream Apply 1 application topically 3 (three) times daily.   Yes Historical Provider, MD  Multiple Vitamins-Minerals (PRESERVISION AREDS 2 PO) Take 1  capsule by mouth 2 (two) times daily.   Yes Historical Provider, MD  Omega-3 Fatty Acids (FISH OIL) 1000 MG CAPS Take 1,000 mg by mouth at bedtime.   Yes Historical Provider, MD  polyethylene glycol (MIRALAX / GLYCOLAX) packet Take 17 g by mouth daily as needed for mild constipation.   Yes Historical Provider, MD  timolol (BETIMOL) 0.5 % ophthalmic solution Place 1 drop into both eyes daily.    Yes Historical Provider, MD  UNABLE TO FIND Take 1 each by mouth 2 (two) times daily. Med Name: Magic Cup At 10 am and at 2 pm   Yes Historical Provider, MD  Acetaminophen-Codeine (TYLENOL/CODEINE #3) 300-30 MG tablet Take 1 tablet by mouth every 4 (four) hours as needed for pain (severe pain). 01/11/16   [name], MD  amoxicillin-clavulanate (AUGMENTIN) 875-125 MG tablet Take 1 tablet by mouth 2 (two) times daily. 01/11/16   [name], MD  feeding supplement, ENSURE ENLIVE, (ENSURE ENLIVE) LIQD Take 237 mLs by mouth 3 (three) times daily between meals. Patient not taking: Reported on 01/11/2016 01/16/15   [name], MD  HYDROcodone-acetaminophen (NORCO/VICODIN) 5-325 MG per tablet Take 1 tablet by mouth every 8 (eight) hours as needed for moderate pain. Patient not taking: Reported on 01/11/2016 01/15/15   [name], MD  iron polysaccharides (NIFEREX) 150 MG capsule Take 1 capsule (150 mg total) by mouth daily. Patient not taking: Reported on 01/11/2016 01/16/15   [name], MD  rivaroxaban (XARELTO) 10 MG TABS tablet Take 1 tablet (10 mg total) by mouth daily. Patient not taking: Reported on 01/11/2016 01/15/15   [name], MD   BP 159/60 mmHg  Pulse 62  Temp(Src) 97.9 F (36.6 C) (Oral)  Resp 16  SpO2 94% Physical Exam  Constitutional: She appears well-developed and well-nourished. No distress.  HENT:  Head: Normocephalic and atraumatic.  Eyes: Conjunctivae are normal. Right eye exhibits no discharge. Left eye exhibits no discharge.  Neck: Normal range of  motion. Neck supple. No JVD present.  Cardiovascular: Normal rate, regular rhythm, normal heart sounds and intact distal pulses.   No murmur heard. Pulmonary/Chest: Effort normal and breath sounds normal. No respiratory distress. She exhibits tenderness (point tenderness along the left lower rib to the side, no crepitus or bruising).  Abdominal: Soft. Bowel sounds are normal. She exhibits no distension and no mass. There is no tenderness. There is no rebound and no guarding.  Musculoskeletal: She exhibits no edema (no LE edema).  Neurological: She is alert.  Skin: Skin is warm. No rash noted.  Psychiatric: She has a normal mood and affect.  Nursing note and vitals reviewed.   ED Course  Procedures (including critical care time) Labs Review Labs Reviewed  CBC WITH DIFFERENTIAL/PLATELET - Abnormal; Notable for the following:  WBC 11.4 (*)    Platelets 132 (*)    Monocytes Absolute 2.0 (*)    All other components within normal limits  BASIC METABOLIC PANEL - Abnormal; Notable for the following:    Glucose, Bld 122 (*)    All other components within normal limits  BRAIN NATRIURETIC PEPTIDE - Abnormal; Notable for the following:    B Natriuretic Peptide 130.1 (*)    All other components within normal limits  I-STAT TROPOININ, ED    Imaging Review Dg Chest 2 View  01/11/2016  CLINICAL DATA:  Increased shortness of breath and left-sided chest pain. Status post fall 3 days ago with left-sided rib pain. EXAM: CHEST  2 VIEW COMPARISON:  01/09/2015 FINDINGS: Cardiomediastinal silhouette is normal. Mediastinal contours appear intact. There is no evidence of pneumothorax. There is left lower lobe airspace consolidation versus atelectasis with associated small pleural effusion. There are minimally displaced fracture of left posterior fifth and sixth ribs. Side plate and screw fixation of the left humerus is seen. Soft tissues are grossly normal. IMPRESSION: Interval development of left lower lobe  airspace consolidation versus atelectasis with associated left pleural effusion. Minimally displaced posterior left fifth and sixth rib fractures. Electronically Signed   By: Fidela Salisbury M.D.   On: 01/11/2016 21:59   I have personally reviewed and evaluated these images and lab results as part of my medical decision-making.   EKG Interpretation   Date/Time:  Friday January 11 2016 21:24:51 EDT Ventricular Rate:  62 PR Interval:  214 QRS Duration: 91 QT Interval:  434 QTC Calculation: 441 R Axis:   35 Text Interpretation:  Sinus rhythm Borderline prolonged PR interval  Probable LVH with secondary repol abnrm Anterior ST elevation, probably  due to LVH No significant change since last tracing Confirmed by YAO  MD,  DAVID ([id number]) on 01/11/2016 9:47:15 PM      MDM   Final diagnoses:  Community acquired pneumonia  Rib fracture, left, closed, initial encounter   X-ray with rib fractures and evidence of Pneumonia. In the Setting of [place] and Cough, We'll Treat with Augmentin. Doubt Atypical Pneumonia, Augmentin Prescribed and First Dose in the Emergency Department. Doubt PE as patient has an obvious alternative source short of breath with only evident during episode of acute pain. No hemoptysis. Leg is healing well from her femur fracture. No swelling or tenderness to suggest DVT. Patient also likely not a candidate for anticoagulation given recurrent falls. Doubt ACS, troponin negative and EKG unremarkable. Discussed at length with family and patient will be discharged with follow-up with primary care provider in 2 days.    [name], MD 01/11/16 [id number]  Wandra Arthurs, MD 01/12/16 2046

## 2016-01-11 NOTE — Discharge Instructions (Signed)

## 2016-01-11 NOTE — ED Notes (Signed)
Pt taken to xray 

## 2016-01-12 NOTE — ED Notes (Signed)
[facility] called for pt 

## 2016-08-13 ENCOUNTER — Emergency Department (HOSPITAL_COMMUNITY)
Admission: EM | Admit: 2016-08-13 | Discharge: 2016-08-14 | Disposition: A | Discharge status: Home / Self Care | Payer: Medicare Other | Attending: Emergency Medicine | Admitting: Emergency Medicine

## 2016-08-13 ENCOUNTER — Emergency Department (HOSPITAL_COMMUNITY): Payer: Medicare Other

## 2016-08-13 ENCOUNTER — Encounter (HOSPITAL_COMMUNITY): Payer: Self-pay | Admitting: Emergency Medicine

## 2016-08-13 DIAGNOSIS — Y9389 Activity, other specified: Secondary | ICD-10-CM | POA: Diagnosis not present

## 2016-08-13 DIAGNOSIS — I10 Essential (primary) hypertension: Secondary | ICD-10-CM | POA: Diagnosis not present

## 2016-08-13 DIAGNOSIS — Y999 Unspecified external cause status: Secondary | ICD-10-CM | POA: Insufficient documentation

## 2016-08-13 DIAGNOSIS — Z79899 Other long term (current) drug therapy: Secondary | ICD-10-CM | POA: Diagnosis not present

## 2016-08-13 DIAGNOSIS — S52044A Nondisplaced fracture of coronoid process of right ulna, initial encounter for closed fracture: Secondary | ICD-10-CM

## 2016-08-13 DIAGNOSIS — Z96642 Presence of left artificial hip joint: Secondary | ICD-10-CM | POA: Insufficient documentation

## 2016-08-13 DIAGNOSIS — E039 Hypothyroidism, unspecified: Secondary | ICD-10-CM | POA: Diagnosis not present

## 2016-08-13 DIAGNOSIS — W19XXXA Unspecified fall, initial encounter: Secondary | ICD-10-CM

## 2016-08-13 DIAGNOSIS — G309 Alzheimer's disease, unspecified: Secondary | ICD-10-CM | POA: Insufficient documentation

## 2016-08-13 DIAGNOSIS — Z853 Personal history of malignant neoplasm of breast: Secondary | ICD-10-CM | POA: Insufficient documentation

## 2016-08-13 DIAGNOSIS — Y929 Unspecified place or not applicable: Secondary | ICD-10-CM | POA: Insufficient documentation

## 2016-08-13 DIAGNOSIS — W230XXA Caught, crushed, jammed, or pinched between moving objects, initial encounter: Secondary | ICD-10-CM | POA: Diagnosis not present

## 2016-08-13 DIAGNOSIS — S6991XA Unspecified injury of right wrist, hand and finger(s), initial encounter: Secondary | ICD-10-CM | POA: Diagnosis present

## 2016-08-13 MED ORDER — ACETAMINOPHEN 500 MG PO TABS
1000.0000 mg | ORAL_TABLET | Freq: Once | ORAL | Status: AC
  Administered 2016-08-13: 1000 mg via ORAL
  Filled 2016-08-13: qty 2

## 2016-08-13 MED ORDER — ACETAMINOPHEN 500 MG PO TABS: 1000.0000 mg | ORAL_TABLET | Freq: Three times a day (TID) | ORAL | 0 refills | Status: AC

## 2016-08-13 NOTE — ED Provider Notes (Signed)
[facility] DEPT Provider Note   CSN: [id number] Arrival date & time: 08/13/16  1853     History   Chief Complaint Chief Complaint  Patient presents with  . Fall   Triage Note 1853 Per [facility], Pt from [facility] on [place] slid from wheelchair to floor, not witnessed. Pt's left arm was caught in wheelchair as she slid. Pt did not hit her head or lose consciousness.  Pt is not on blood thinners. Pt has chronic R shoulder pain. Pt has hx of dementia. Pt is alert and oriented. BP- 110/70, HR 65, RR 14, 100% on Ra. Pt reports a headache and L arm pain. No obvious injuries noted    HPI Amber Fowler is a 83 y.o. female.  HPI  Remainder of history, ROS, and physical exam limited due to patient's condition (dementia). Additional information was obtained from EMS and sNF.   Level V Caveat.    Patient can tell me that she fell from a wheelchair however she does not remember details surrounding the episode. Patient is currently denying any pain at this moment.  I called the facility and spoke with one of the representatives to was there during the episode. She reports that the patient was last seen 20 minutes prior to being found down next to her wheelchair with her left hand stuck in the arm rest. Patient was awake and alert and in her baseline mental status. To the facility she was complaining of left arm pain.   Past Medical History:  Diagnosis Date  . Alzheimer disease    /notes 01/09/2015  . Anemia   . Cancer of right breast ([place])   . Depression   . Glaucoma   . Hyperlipidemia   . Hypertension   . Hypothyroid   . Lumbar stenosis   . Osteoarthritis   . Osteoarthritis   . Subdural hematoma ([place])   . Thyroid disease   . Vitamin E deficiency     Patient Active Problem List   Diagnosis Date Noted  . Humerus fracture   . Acute blood loss anemia   . Hip fracture requiring operative repair ([place]) 01/10/2015  . Hypertension 01/09/2015  . Hypothyroid 01/09/2015  .  Alzheimer disease 01/09/2015  . Left displaced femoral neck fracture ([place]) 01/09/2015    Past Surgical History:  Procedure Laterality Date  . HIP ARTHROPLASTY Left 01/10/2015   Procedure: ARTHROPLASTY BIPOLAR HIP (HEMIARTHROPLASTY);  Surgeon: [name], MD;  Location: [facility];  Service: Orthopedics;  Laterality: Left;  [name] MASTECTOMY Right 2003   [name] 10/01/2010  . ORIF HUMERUS FRACTURE Left 01/10/2015   Procedure: OPEN REDUCTION INTERNAL FIXATION (ORIF) HUMERAL SHAFT FRACTURE;  Surgeon: [name], MD;  Location: [facility];  Service: Orthopedics;  Laterality: Left;    OB History    No data available       Home Medications    Prior to Admission medications   Medication Sig Start Date End Date Taking? Authorizing Provider  amoxicillin (AMOXIL) 500 MG capsule Take 2,000 mg by mouth See admin instructions. One hour prior to dental procedure(s)   Yes Historical Provider, MD  carboxymethylcellulose 1 % ophthalmic solution Place 1 drop into both eyes 3 (three) times daily.   Yes Historical Provider, MD  Cholecalciferol (VITAMIN D) 2000 units CAPS Take 2,000 Units by mouth at bedtime.   Yes Historical Provider, MD  docusate sodium (COLACE) 100 MG capsule Take 100 mg by mouth 2 (two) times daily.   Yes Historical Provider,  MD  DULoxetine (CYMBALTA) 20 MG capsule Take 20 mg by mouth daily.   Yes Historical Provider, MD  latanoprost (XALATAN) 0.005 % ophthalmic solution Place 1 drop into both eyes at bedtime.   Yes Historical Provider, MD  levothyroxine (SYNTHROID, LEVOTHROID) 50 MCG tablet Take 50 mcg by mouth every morning.   Yes Historical Provider, MD  lisinopril (PRINIVIL,ZESTRIL) 20 MG tablet Take 20 mg by mouth daily.   Yes Historical Provider, MD  miconazole (BAZA ANTIFUNGAL) 2 % cream Apply 1 application topically 3 (three) times daily.   Yes Historical Provider, MD  Multiple Vitamins-Minerals (PRESERVISION AREDS 2 PO) Take 1 capsule by mouth 2 (two) times daily.   Yes  Historical Provider, MD  Oyster Shell 500 MG TABS Take 1 tablet by mouth 2 (two) times daily.   Yes Historical Provider, MD  polyethylene glycol (MIRALAX / GLYCOLAX) packet Take 17 g by mouth daily as needed for mild constipation.   Yes Historical Provider, MD  polyvinyl alcohol (LIQUIFILM TEARS) 1.4 % ophthalmic solution Place 1 drop into both eyes 2 (two) times daily as needed (for irritation or blurred vision).   Yes Historical Provider, MD  acetaminophen (TYLENOL) 500 MG tablet Take 2 tablets (1,000 mg total) by mouth every 8 (eight) hours. Do not take more than 4000 mg of acetaminophen (Tylenol) in a 24-hour period. Please note that other medicines that you may be prescribed may have Tylenol as well. 08/13/16 08/18/16  Fatima Blank, MD  Acetaminophen-Codeine (TYLENOL/CODEINE #3) 300-30 MG tablet Take 1 tablet by mouth every 4 (four) hours as needed for pain (severe pain). Patient not taking: Reported on 08/13/2016 01/11/16   [name], MD  Amino Acids-Protein Hydrolys (FEEDING SUPPLEMENT, PRO-STAT SUGAR FREE 64,) LIQD Take 30 mLs by mouth 2 (two) times daily.    Historical Provider, MD  amoxicillin-clavulanate (AUGMENTIN) 875-125 MG tablet Take 1 tablet by mouth 2 (two) times daily. Patient not taking: Reported on 08/13/2016 01/11/16   [name], MD  atorvastatin (LIPITOR) 20 MG tablet Take 20 mg by mouth at bedtime.    Historical Provider, MD  feeding supplement, ENSURE ENLIVE, (ENSURE ENLIVE) LIQD Take 237 mLs by mouth 3 (three) times daily between meals. Patient not taking: Reported on 08/13/2016 01/16/15   [name], MD  HYDROcodone-acetaminophen (NORCO/VICODIN) 5-325 MG per tablet Take 1 tablet by mouth every 8 (eight) hours as needed for moderate pain. Patient not taking: Reported on 08/13/2016 01/15/15   [name], MD  iron polysaccharides (NIFEREX) 150 MG capsule Take 1 capsule (150 mg total) by mouth daily. Patient not taking: Reported on 08/13/2016  01/16/15   [name], MD  Omega-3 Fatty Acids (FISH OIL) 1000 MG CAPS Take 1,000 mg by mouth at bedtime.    Historical Provider, MD  rivaroxaban (XARELTO) 10 MG TABS tablet Take 1 tablet (10 mg total) by mouth daily. Patient not taking: Reported on 08/13/2016 01/15/15   [name], MD  timolol (BETIMOL) 0.5 % ophthalmic solution Place 1 drop into both eyes daily.     Historical Provider, MD  UNABLE TO FIND Take 1 each by mouth 2 (two) times daily. Med Name: Magic Cup At 10 am and at 2 pm    Historical Provider, MD    Family History No family history on file.  Social History Social History  Substance Use Topics  . Smoking status: Never Smoker  . Smokeless tobacco: Never Used  . Alcohol use No     Allergies  Patient has no known allergies.   Review of Systems Review of Systems  Unable to perform ROS: Dementia     Physical Exam Updated Vital Signs BP 157/79   Pulse 86   Resp 18   SpO2 99%   Physical Exam  Constitutional: She is oriented to person, place, and time. She appears well-developed and well-nourished. No distress.  HENT:  Head: Normocephalic and atraumatic.  Right Ear: External ear normal.  Left Ear: External ear normal.  Nose: Nose normal.  Eyes: Conjunctivae and EOM are normal. Pupils are equal, round, and reactive to light. Right eye exhibits no discharge. Left eye exhibits no discharge. No scleral icterus.  Neck: Normal range of motion. Neck supple.  Cardiovascular: Normal rate, regular rhythm and normal heart sounds.  Exam reveals no gallop and no friction rub.   No murmur heard. Pulses:      Radial pulses are 2+ on the right side, and 2+ on the left side.       Dorsalis pedis pulses are 2+ on the right side, and 2+ on the left side.  Pulmonary/Chest: Effort normal and breath sounds normal. No stridor. No respiratory distress. She has no wheezes.  Abdominal: Soft. She exhibits no distension. There is no tenderness.  Musculoskeletal: She  exhibits no edema or tenderness.       Cervical back: She exhibits no bony tenderness.       Thoracic back: She exhibits no bony tenderness.       Lumbar back: She exhibits no bony tenderness.  Clavicles stable. Chest stable to AP/Lat compression. Pelvis stable to Lat compression. No obvious extremity deformity. No chest or abdominal wall contusion.  Neurological: She is alert and oriented to person, place, and time.  Moving all extremities  Skin: Skin is warm and dry. No rash noted. She is not diaphoretic. No erythema.  Psychiatric: She has a normal mood and affect.     ED Treatments / Results  Labs (all labs ordered are listed, but only abnormal results are displayed) Labs Reviewed - No data to display  EKG  EKG Interpretation None       Radiology Dg Forearm Left  Result Date: 08/13/2016 CLINICAL DATA:  Left arm caught in wheelchair after sliding off the wheelchair. EXAM: LEFT FOREARM - 2 VIEW COMPARISON:  None. FINDINGS: No acute fracture of the radius nor ulna. Osteoarthritic joint space narrowing at the base of the thumb metacarpal and triscaphe joint of the wrist. Partially visualized humeral shaft fixation with plate and screws noted. IMPRESSION: No acute osseous abnormality of the left radius and ulna. Osteoarthritis of the first [facility] and triscaphe joint. Electronically Signed   By: Ashley Royalty M.D.   On: 08/13/2016 21:53   Dg Forearm Right  Result Date: 08/13/2016 CLINICAL DATA:  Pain after slipping off wheelchair EXAM: RIGHT FOREARM - 2 VIEW COMPARISON:  None. FINDINGS: A nondisplaced fracture of the coronoid process of the ulna along its medial aspect is seen. The elbow joint is maintained. There is ulnar minus variance. IMPRESSION: Nondisplaced fracture of the coronoid process of the ulna along its medial aspect. Ulnar minus variance at the wrist. Electronically Signed   By: Ashley Royalty M.D.   On: 08/13/2016 21:57   Dg Hips Bilat W Or Wo Pelvis 2 Views  Result Date:  08/13/2016 CLINICAL DATA:  Unwitnessed fall from wheelchair. History of dementia. EXAM: DG HIP (WITH OR WITHOUT PELVIS) 2V BILAT COMPARISON:  Plain film of the pelvis dated 01/10/2015. FINDINGS: The left  hip hemiarthroplasty hardware appears intact and stable in position. No acute osseous fracture or dislocation identified at either hip or within the osseous pelvis. Degenerative changes noted within the lower lumbar spine. Soft tissues about the pelvis and bilateral hips are unremarkable. IMPRESSION: No acute findings. No osseous fracture or dislocation. Left hip arthroplasty hardware appears intact and stable in position. Electronically Signed   By: Franki Cabot M.D.   On: 08/13/2016 21:54    Procedures Procedures (including critical care time)  Medications Ordered in ED Medications  acetaminophen (TYLENOL) tablet 1,000 mg (1,000 mg Oral Given 08/13/16 2213)     Initial Impression / Assessment and Plan / ED Course  I have reviewed the triage vital signs and the nursing notes.  Pertinent labs & imaging results that were available during my care of the patient were reviewed by me and considered in my medical decision making (see chart for details).  Clinical Course     Initially patient not complaining of any pain on my exam. However after discussing the case with the skilled nursing facility in speaking with the patient's son, went back in to inform the patient that she was going back to her facility at which time she was complaining of left arm pain. I repeated the exam, to which she noted tenderness to palpation of the left forearm, and mild discomfort to the right forearm. Given the patient's age and the fact that she was found down on her bottom, also decided to obtain plain films of her pelvis to rule out any pelvic or hip fractures. Obtain bilateral forearm plain films.   Plain film of the right forearm revealed right ulnar coronoid process fracture. Other films negative. Right forearm  was placed in a splint. I called the patient's son to inform him of the new findings and was unable to reach him but left a message.  We'll have patient follow-up with orthopedic surgery in 1 to 2 weeks.  Final Clinical Impressions(s) / ED Diagnoses   Final diagnoses:  Fall  Nondisplaced fracture of coronoid process of right ulna, initial encounter for closed fracture   Disposition: Discharge  Condition: Good  I have discussed the results, Dx and Tx plan with the patient who expressed understanding and agree(s) with the plan. Discharge instructions discussed at great length. The patient was given strict return precautions who verbalized understanding of the instructions. No further questions at time of discharge.    New Prescriptions   ACETAMINOPHEN (TYLENOL) 500 MG TABLET    Take 2 tablets (1,000 mg total) by mouth every 8 (eight) hours. Do not take more than 4000 mg of acetaminophen (Tylenol) in a 24-hour period. Please note that other medicines that you may be prescribed may have Tylenol as well.    Follow Up: Kelton Pillar, MD [id number] [place] [address] [phone]  Schedule an appointment as soon as possible for a visit in 1 week For close follow up to assess for right ulnar coronoid fracture      Fatima Blank, MD 08/13/16 2235

## 2016-08-13 NOTE — ED Notes (Signed)
Ortho tech at the bedside.  

## 2016-08-13 NOTE — Progress Notes (Signed)
Orthopedic Tech Progress Note Patient Details:  Amber Fowler 1919/07/11 [id number]  Ortho Devices Type of Ortho Device: Sugartong splint, Arm sling Ortho Device/Splint Location: rue Ortho Device/Splint Interventions: Ordered, Application   Karolee Stamps 08/13/2016, 10:37 PM

## 2016-08-13 NOTE — ED Triage Notes (Signed)
Per [facility], Pt from [facility] on [address] slid from wheelchair to floor, not witnessed. Pt's left arm was caught in wheelchair as she slid. Pt did not hit her head or lose consciousness.  Pt is not on blood thinners. Pt has chronic R shoulder pain. Pt has hx of dementia. Pt is alert and oriented. BP- 110/70, HR 65, RR 14, 100% on Ra. Pt reports a headache and L arm pain. No obvious injuries noted

## 2016-08-21 ENCOUNTER — Encounter (INDEPENDENT_AMBULATORY_CARE_PROVIDER_SITE_OTHER): Payer: Self-pay | Admitting: Orthopedic Surgery

## 2016-08-21 ENCOUNTER — Ambulatory Visit (INDEPENDENT_AMBULATORY_CARE_PROVIDER_SITE_OTHER): Payer: Medicare Other | Admitting: Orthopedic Surgery

## 2016-08-21 DIAGNOSIS — S52044A Nondisplaced fracture of coronoid process of right ulna, initial encounter for closed fracture: Secondary | ICD-10-CM

## 2016-08-21 NOTE — Progress Notes (Signed)
Office Visit Note   Patient: Amber Fowler           Date of Birth: January 26, 1919           MRN: [id number] Visit Date: 08/21/2016 Requested by: No referring provider defined for this encounter. PCP: No PCP Per Patient  Subjective: Chief Complaint  Patient presents with  . Right Elbow - Fracture    HPI Amber Fowler is a 83 year old patient with right elbow pain.  She is a resident of a nursing home.  She uses her arms and hands for eating but that's about it.  She does not walk.  She was trying to get up and stand up out of her wheelchair when she fell in slid down and hit the floor.  She was evaluated 8 days ago and radiographs showed intact hip prosthesis on the left and a nondisplaced type I coronoid fracture on the right.  Patient has dementia.  Patient is right-hand dominant              Review of Systems All systems reviewed are negative as they relate to the chief complaint within the history of present illness.  Patient denies  fevers or chills.    Assessment & Plan: Visit Diagnoses:  1. Closed nondisplaced fracture of coronoid process of right ulna, initial encounter     Plan: Impression is coronoid fracture with fairly minimal swelling and good stable elbow with good range of motion.  Motor sensory function to the hand is intact.  I will put an Ace wrap around that elbow today and let her pursue range of motion as tolerated.  No need for further immobilization at this time.  I'll see her back as needed  Follow-Up Instructions: Return if symptoms worsen or fail to improve.   Orders:  No orders of the defined types were placed in this encounter.  No orders of the defined types were placed in this encounter.     Procedures: No procedures performed   Clinical Data: No additional findings.  Objective: Vital Signs: There were no vitals taken for this visit.  Physical Exam   Constitutional: Patient appears well-developed HEENT:  Head: Normocephalic Eyes:EOM are  normal Neck: Normal range of motion Cardiovascular: Normal rate Pulmonary/chest: Effort normal Neurologic: Patient is alert Skin: Skin is warm Psychiatric: Patient has pleasant demeanor    Ortho Exam examination the left hip demonstrates equal leg lengths with pretty reasonable range of motion.  Left arm also has good range of motion at the elbow with no evidence of fracture motion and the humeral shaft fracture.  Right elbow is examined.  Mild bruising present medially.  Mild tenderness present to palpation in this area.  She has a little bit of coarseness with shoulder range of motion consistent with her known history of shoulder arthritis.  I'll detect any fracture motion up around the humeral head.  No pain in the shoulder area with internal/external rotation of the arm.  In regards to the elbow she has good pronation supination without pain or tenderness.  Radial pulses intact.  She has no real instability to varus or valgus stress with the elbow in extension.  Specialty Comments:  No specialty comments available.  Imaging: No results found.   PMFS History: Patient Active Problem List   Diagnosis Date Noted  . Closed nondisplaced fracture of coronoid process of right ulna 08/21/2016  . Humerus fracture   . Acute blood loss anemia   . Hip fracture requiring operative repair ([place])  01/10/2015  . Hypertension 01/09/2015  . Hypothyroid 01/09/2015  . Alzheimer disease 01/09/2015  . Left displaced femoral neck fracture ([place]) 01/09/2015   Past Medical History:  Diagnosis Date  . Alzheimer disease    /notes 01/09/2015  . Anemia   . Cancer of right breast ([place])   . Depression   . Glaucoma   . Hyperlipidemia   . Hypertension   . Hypothyroid   . Lumbar stenosis   . Osteoarthritis   . Osteoarthritis   . Subdural hematoma ([place])   . Thyroid disease   . Vitamin E deficiency     No family history on file.  Past Surgical History:  Procedure Laterality Date  . HIP ARTHROPLASTY  Left 01/10/2015   Procedure: ARTHROPLASTY BIPOLAR HIP (HEMIARTHROPLASTY);  Surgeon: [name], MD;  Location: [facility];  Service: Orthopedics;  Laterality: Left;  [name] MASTECTOMY Right 2003   [name] 10/01/2010  . ORIF HUMERUS FRACTURE Left 01/10/2015   Procedure: OPEN REDUCTION INTERNAL FIXATION (ORIF) HUMERAL SHAFT FRACTURE;  Surgeon: [name], MD;  Location: [facility];  Service: Orthopedics;  Laterality: Left;   Social History   Occupational History  . Not on file.   Social History Main Topics  . Smoking status: Never Smoker  . Smokeless tobacco: Never Used  . Alcohol use No  . Drug use: No  . Sexual activity: Not on file

## 2017-03-15 ENCOUNTER — Encounter (HOSPITAL_COMMUNITY): Payer: Self-pay

## 2017-03-15 ENCOUNTER — Emergency Department (HOSPITAL_COMMUNITY)
Admission: EM | Admit: 2017-03-15 | Discharge: 2017-03-15 | Disposition: A | Discharge status: Home / Self Care | Payer: Medicare Other | Attending: Emergency Medicine | Admitting: Emergency Medicine

## 2017-03-15 ENCOUNTER — Emergency Department (HOSPITAL_COMMUNITY): Payer: Medicare Other

## 2017-03-15 DIAGNOSIS — Z853 Personal history of malignant neoplasm of breast: Secondary | ICD-10-CM | POA: Insufficient documentation

## 2017-03-15 DIAGNOSIS — E039 Hypothyroidism, unspecified: Secondary | ICD-10-CM | POA: Diagnosis not present

## 2017-03-15 DIAGNOSIS — Z7901 Long term (current) use of anticoagulants: Secondary | ICD-10-CM | POA: Diagnosis not present

## 2017-03-15 DIAGNOSIS — F329 Major depressive disorder, single episode, unspecified: Secondary | ICD-10-CM | POA: Insufficient documentation

## 2017-03-15 DIAGNOSIS — I10 Essential (primary) hypertension: Secondary | ICD-10-CM | POA: Diagnosis not present

## 2017-03-15 DIAGNOSIS — Z79899 Other long term (current) drug therapy: Secondary | ICD-10-CM | POA: Diagnosis not present

## 2017-03-15 DIAGNOSIS — R404 Transient alteration of awareness: Secondary | ICD-10-CM | POA: Diagnosis not present

## 2017-03-15 DIAGNOSIS — Z96642 Presence of left artificial hip joint: Secondary | ICD-10-CM | POA: Insufficient documentation

## 2017-03-15 DIAGNOSIS — R4182 Altered mental status, unspecified: Secondary | ICD-10-CM | POA: Diagnosis present

## 2017-03-15 LAB — URINALYSIS, ROUTINE W REFLEX MICROSCOPIC
Bacteria, UA: NONE SEEN
Bilirubin Urine: NEGATIVE
GLUCOSE, UA: NEGATIVE mg/dL
Ketones, ur: 20 mg/dL — AB
Nitrite: NEGATIVE
PH: 6 (ref 5.0–8.0)
Protein, ur: 100 mg/dL — AB
SPECIFIC GRAVITY, URINE: 1.02 (ref 1.005–1.030)

## 2017-03-15 LAB — COMPREHENSIVE METABOLIC PANEL
ALBUMIN: 3.6 g/dL (ref 3.5–5.0)
ALK PHOS: 47 U/L (ref 38–126)
ALT: 10 U/L — ABNORMAL LOW (ref 14–54)
ANION GAP: 4 — AB (ref 5–15)
AST: 21 U/L (ref 15–41)
BILIRUBIN TOTAL: 0.9 mg/dL (ref 0.3–1.2)
BUN: 8 mg/dL (ref 6–20)
CALCIUM: 8.9 mg/dL (ref 8.9–10.3)
CO2: 27 mmol/L (ref 22–32)
Chloride: 109 mmol/L (ref 101–111)
Creatinine, Ser: 0.75 mg/dL (ref 0.44–1.00)
GFR calc Af Amer: >60 mL/min (ref >60)
GLUCOSE: 85 mg/dL (ref 65–99)
Potassium: 3.8 mmol/L (ref 3.5–5.1)
SODIUM: 140 mmol/L (ref 135–145)
TOTAL PROTEIN: 7.1 g/dL (ref 6.5–8.1)

## 2017-03-15 LAB — CBC
HCT: 42.1 % (ref 36.0–46.0)
Hemoglobin: 14 g/dL (ref 12.0–15.0)
MCH: 29.8 pg (ref 26.0–34.0)
MCHC: 33.3 g/dL (ref 30.0–36.0)
MCV: 89.6 fL (ref 78.0–100.0)
Platelets: 158 10*3/uL (ref 150–400)
RBC: 4.7 MIL/uL (ref 3.87–5.11)
RDW: 14.3 % (ref 11.5–15.5)
WBC: 6.7 10*3/uL (ref 4.0–10.5)

## 2017-03-15 LAB — TSH: TSH: 4.014 u[IU]/mL (ref 0.350–4.500)

## 2017-03-15 MED ORDER — NITROFURANTOIN MONOHYD MACRO 100 MG PO CAPS: 100.0000 mg | ORAL_CAPSULE | Freq: Two times a day (BID) | ORAL | 0 refills | Status: DC

## 2017-03-15 MED ORDER — NITROFURANTOIN MONOHYD MACRO 100 MG PO CAPS
100.0000 mg | ORAL_CAPSULE | Freq: Once | ORAL | Status: AC
  Administered 2017-03-15: 100 mg via ORAL
  Filled 2017-03-15: qty 1

## 2017-03-15 NOTE — ED Notes (Signed)
PTAR at bedside 

## 2017-03-15 NOTE — ED Triage Notes (Signed)
To room via EMS.  Pt from [facility] home.  Onset several days increased confusion, agitation.  Pt normally A&Ox4 and conversational.  Pt is A&Ox1 at this time.  Pt son at bedside, pt does know who son is.

## 2017-03-15 NOTE — ED Provider Notes (Signed)
[facility] DEPT Provider Note   CSN: [id number] Arrival date & time: 03/15/17  1234     History   Chief Complaint Chief Complaint  Patient presents with  . Altered Mental Status    HPI Amber Fowler is a 83 y.o. female.83 year old female from assisted living facility. Gets help with meals. Is wheelchair-bound. Essentially has 24-hour care. Didn't want to eat yesterday. Felt like someone was maybe trying to hurt her at her apartment. Seemed normal today. Her son Amber Fowler daily. Would have her evaluated because of the symptoms are related to him by the nurses at the facility when he returned to town today.  HPI  Past Medical History:  Diagnosis Date  . Alzheimer disease    /notes 01/09/2015  . Anemia   . Cancer of right breast ([place])   . Depression   . Glaucoma   . Hyperlipidemia   . Hypertension   . Hypothyroid   . Lumbar stenosis   . Osteoarthritis   . Osteoarthritis   . Subdural hematoma ([place])   . Thyroid disease   . Vitamin E deficiency     Patient Active Problem List   Diagnosis Date Noted  . Closed nondisplaced fracture of coronoid process of right ulna 08/21/2016  . Humerus fracture   . Acute blood loss anemia   . Hip fracture requiring operative repair ([place]) 01/10/2015  . Hypertension 01/09/2015  . Hypothyroid 01/09/2015  . Alzheimer disease 01/09/2015  . Left displaced femoral neck fracture ([place]) 01/09/2015    Past Surgical History:  Procedure Laterality Date  . HIP ARTHROPLASTY Left 01/10/2015   Procedure: ARTHROPLASTY BIPOLAR HIP (HEMIARTHROPLASTY);  Surgeon: [name], MD;  Location: [facility];  Service: Orthopedics;  Laterality: Left;  [name] MASTECTOMY Right 2003   [name] 10/01/2010  . ORIF HUMERUS FRACTURE Left 01/10/2015   Procedure: OPEN REDUCTION INTERNAL FIXATION (ORIF) HUMERAL SHAFT FRACTURE;  Surgeon: [name], MD;  Location: [facility];  Service: Orthopedics;  Laterality: Left;    OB History    No data available        Home Medications    Prior to Admission medications   Medication Sig Start Date End Date Taking? Authorizing Provider  Acetaminophen-Codeine (TYLENOL/CODEINE #3) 300-30 MG tablet Take 1 tablet by mouth every 4 (four) hours as needed for pain (severe pain). 01/11/16   [name], MD  Amino Acids-Protein Hydrolys (FEEDING SUPPLEMENT, PRO-STAT SUGAR FREE 64,) LIQD Take 30 mLs by mouth 2 (two) times daily.    [provider]  amoxicillin (AMOXIL) 500 MG capsule Take 2,000 mg by mouth See admin instructions. One hour prior to dental procedure(s)    [provider]  amoxicillin-clavulanate (AUGMENTIN) 875-125 MG tablet Take 1 tablet by mouth 2 (two) times daily. 01/11/16   [name], MD  atorvastatin (LIPITOR) 20 MG tablet Take 20 mg by mouth at bedtime.    [provider]  carboxymethylcellulose 1 % ophthalmic solution Place 1 drop into both eyes 3 (three) times daily.    [provider]  Cholecalciferol (VITAMIN D) 2000 units CAPS Take 2,000 Units by mouth at bedtime.    [provider]  docusate sodium (COLACE) 100 MG capsule Take 100 mg by mouth 2 (two) times daily.    [provider]  DULoxetine (CYMBALTA) 20 MG capsule Take 20 mg by mouth daily.    [provider]  feeding supplement, ENSURE ENLIVE, (ENSURE ENLIVE) LIQD Take 237 mLs by mouth 3 (three) times daily between  meals. 01/16/15   [name], MD  HYDROcodone-acetaminophen (NORCO/VICODIN) 5-325 MG per tablet Take 1 tablet by mouth every 8 (eight) hours as needed for moderate pain. 01/15/15   [name], MD  iron polysaccharides (NIFEREX) 150 MG capsule Take 1 capsule (150 mg total) by mouth daily. 01/16/15   [name], MD  latanoprost (XALATAN) 0.005 % ophthalmic solution Place 1 drop into both eyes at bedtime.    [provider]  levothyroxine (SYNTHROID, LEVOTHROID) 50 MCG tablet Take 50 mcg by mouth every morning.    [provider]  lisinopril (PRINIVIL,ZESTRIL) 20 MG tablet Take 20 mg by mouth daily.    [provider]  miconazole (BAZA ANTIFUNGAL) 2 % cream Apply 1 application topically 3 (three) times daily.    [provider]  Multiple Vitamins-Minerals (PRESERVISION AREDS 2 PO) Take 1 capsule by mouth 2 (two) times daily.    [provider]  nitrofurantoin, macrocrystal-monohydrate, (MACROBID) 100 MG capsule Take 1 capsule (100 mg total) by mouth 2 (two) times daily. 03/15/17   [name], MD  Omega-3 Fatty Acids (FISH OIL) 1000 MG CAPS Take 1,000 mg by mouth at bedtime.    [provider]  Oyster Shell 500 MG TABS Take 1 tablet by mouth 2 (two) times daily.    [provider]  polyethylene glycol (MIRALAX / GLYCOLAX) packet Take 17 g by mouth daily as needed for mild constipation.    [provider]  polyvinyl alcohol (LIQUIFILM TEARS) 1.4 % ophthalmic solution Place 1 drop into both eyes 2 (two) times daily as needed (for irritation or blurred vision).    [provider]  rivaroxaban (XARELTO) 10 MG TABS tablet Take 1 tablet (10 mg total) by mouth daily. 01/15/15   [name], MD  timolol (BETIMOL) 0.5 % ophthalmic solution Place 1 drop into both eyes daily.     [provider]  UNABLE TO FIND Take 1 each by mouth 2 (two) times daily. Med Name: Magic Cup At 10 am and at 2 pm    [provider]    Family History History reviewed. No pertinent family history.  Social History Social History  Substance Use Topics  . Smoking status: Never Smoker  . Smokeless tobacco: Never Used  . Alcohol use No     Allergies   Patient has no known allergies.   Review of Systems Review of Systems   Physical Exam Updated Vital Signs BP (!) 200/77   Pulse 60   Temp 98.4 F (36.9 C) (Oral)   Resp 19   SpO2 97%   Physical Exam  Constitutional: No distress.  thin  HENT:  Head: Normocephalic.  Eyes: Pupils are  equal, round, and reactive to light. Conjunctivae are normal. No scleral icterus.  Neck: Normal range of motion. Neck supple. No thyromegaly present.  Cardiovascular: Normal rate and regular rhythm.  Exam reveals no gallop and no friction rub.   No murmur heard. Pulmonary/Chest: Effort normal and breath sounds normal. No respiratory distress. She has no wheezes. She has no rales.  Abdominal: Soft. Bowel sounds are normal. She exhibits no distension. There is no tenderness. There is no rebound.  Musculoskeletal: Normal range of motion.  Neurological: She is alert.  Skin: Skin is warm and dry. No rash noted.  Psychiatric: She has a normal mood and affect. Her behavior is normal.     ED Treatments / Results  Labs (all labs ordered are listed, but only abnormal results are  displayed) Labs Reviewed  COMPREHENSIVE METABOLIC PANEL - Abnormal; Notable for the following:       Result Value   ALT 10 (*)    Anion gap 4 (*)    All other components within normal limits  URINALYSIS, ROUTINE W REFLEX MICROSCOPIC - Abnormal; Notable for the following:    Hgb urine dipstick MODERATE (*)    Ketones, ur 20 (*)    Protein, ur 100 (*)    Leukocytes, UA TRACE (*)    Squamous Epithelial / LPF 0-5 (*)    All other components within normal limits  URINE CULTURE  CBC  TSH  CBG MONITORING, ED    EKG  EKG Interpretation  Date/Time:  Sunday March 15 2017 12:38:58 EDT Ventricular Rate:  84 PR Interval:    QRS Duration: 97 QT Interval:  410 QTC Calculation: 485 R Axis:   43 Text Interpretation:  Accelerated junctional rhythm TWI lateral precordial leads without change Confirmed by [name] [phone]) on 03/15/2017 12:59:18 PM       Radiology Dg Chest Port 1 View  Result Date: 03/15/2017 CLINICAL DATA:  84 year old female with her story of confusion and agitation. EXAM: PORTABLE CHEST 1 VIEW COMPARISON:  01/11/2016 FINDINGS: Cardiomediastinal silhouette unchanged in size and contour.  Calcifications of the aortic arch. Low lung volumes. Coarsened interstitial markings bilaterally with some architectural distortion in the hilar regions. No pneumothorax. No confluent airspace disease. No large pleural effusion. Surgical changes partially imaged of the left humerus. Bilateral degenerative changes of the shoulders. No acute displaced fracture identified. IMPRESSION: Chronic lung changes without evidence of superimposed acute cardiopulmonary disease. Electronically Signed   By: Corrie Mckusick D.O.   On: 03/15/2017 13:15    Procedures Procedures (including critical care time)  Medications Ordered in ED Medications  nitrofurantoin (macrocrystal-monohydrate) (MACROBID) capsule 100 mg (not administered)     Initial Impression / Assessment and Plan / ED Course  I have reviewed the triage vital signs and the nursing notes.  Pertinent labs & imaging results that were available during my care of the patient were reviewed by me and considered in my medical decision making (see chart for details).     Reassuring studies. Will vulture. 3 day Macrobid.  Final Clinical Impressions(s) / ED Diagnoses   Final diagnoses:  Transient alteration of awareness    New Prescriptions New Prescriptions   NITROFURANTOIN, MACROCRYSTAL-MONOHYDRATE, (MACROBID) 100 MG CAPSULE    Take 1 capsule (100 mg total) by mouth 2 (two) times daily.     [name], MD 03/15/17 [phone]

## 2017-03-15 NOTE — Discharge Instructions (Signed)
Macrobid for 3 days for possible UTI. Return to ER with any new or worsening.

## 2017-03-16 LAB — URINE CULTURE: Culture: NO GROWTH

## 2017-12-09 ENCOUNTER — Non-Acute Institutional Stay: Payer: Medicare Other | Admitting: Hospice and Palliative Medicine

## 2017-12-09 DIAGNOSIS — Z515 Encounter for palliative care: Secondary | ICD-10-CM | POA: Insufficient documentation

## 2017-12-09 NOTE — Progress Notes (Signed)
    PALLIATIVE CARE CONSULT VISIT   PATIENT NAME: Amber Fowler DOB: 26-Apr-1919 MRN: [id number]  PRIMARY CARE PROVIDER: [facility]  REFERRING PROVIDER: [facility] [address], [place] [address]  RESPONSIBLE PARTY:   Son, Dr. Jeanann Lewandowsky [phone]   RECOMMENDATIONS and PLAN:  1. Weakness: secondary to advanced age of 84, HTN, Alzheimer's dementia, OA, lumbar stenosis, bilateral hand contractures as well as others outlined below. She is currently in AL setting at [facility] with a Biomedical engineer during the day. She is getting excellent care. Staff has noticed a gradual decline with some weight loss expected with age and dementia. She is down to 118.6# from 123.7# this past January. She is WC bound with limited ability due to her bilatal hand contractures. She is able to feed herself and speak in full sentences. There is no choking episodes with eating. She reports that she thinks she has a good appetite and sometimes sleeps all night and "sometimes I don't."  She requires full assistance with dressing and bathing and transferring. FAST scale early 7 stage, but has lost the ability to walk. There are no recommendations to make at this time. We will just monitor for decline and discuss disease progression and formalize goals of care 2. Pain: chronic due to lumbar stenosis and arthritis. Seems well controlled on current regime. 3. Depression: also seems well controlled on current regime. She has psych services in house. Continues on Cymbalta 20. 4. Weight loss: approximately 5 # since January. This is expected with her age and dementia. She continues on protein supplements with a hired care giver to sit with her during meals.  5. ACP: DNR form on chart. Will reach out to [name] to meet and complete MOST form.  I spent 30 minutes providing this consultation,  from 11:00am  to 11:30am. More than 50% of the time in this consultation was  spent reviewing medical records, interviewing staff and personal caregiver and  coordinating communication.   HISTORY OF PRESENT ILLNESS:  Amber Fowler is a 84 y.o. female with Alzheimer's dementia who resides at [facility]. Palliative Care was asked to help address any symptom management needs and goals of care.   CODE STATUS: DNR  PPS: 30% HOSPICE ELIGIBILITY/DIAGNOSIS: TBD  PAST MEDICAL HISTORY:  Past Medical History:  Diagnosis Date  . Alzheimer disease    /notes 01/09/2015  . Anemia   . Cancer of right breast ([place])   . Depression   . Glaucoma   . Hyperlipidemia   . Hypertension   . Hypothyroid   . Lumbar stenosis   . Osteoarthritis   . Osteoarthritis   . Subdural hematoma ([place])   . Thyroid disease   . Vitamin E deficiency     SOCIAL HX: widowed. Has two children. She enjoys church. No smoking.   ALLERGIES: No Known Allergies   PERTINENT MEDICATIONS: Aspercream, duloxetine, Vitamin D, Xtra strength Tylenol   PHYSICAL EXAM:  Vitals: May 3rd:  97.6, 67, 16, 152/62  General: Elderly, thin AA female who appears frail. Neatly groomed and dressed Cardiovascular: unable to auscultate  Pulmonary: no cough or wheeze; shallow breathinge Abdomen: unable to auscultate; soft Extremities: bilat hand contractures; muscle wasting; arthritic changes Skin: thin. Exposed skin in tact Neurological: alert and oriented to person. +memory loss: + word searching; + generalized weakness  Nathanial Rancher, NP

## 2017-12-18 ENCOUNTER — Non-Acute Institutional Stay: Payer: Medicare Other | Admitting: Hospice and Palliative Medicine

## 2017-12-18 DIAGNOSIS — Z515 Encounter for palliative care: Secondary | ICD-10-CM

## 2017-12-18 NOTE — Progress Notes (Signed)
PALLIATIVE CARE CONSULT VISIT   PATIENT NAME: Amber Fowler DOB: 03/21/1919 MRN: [id number]  PRIMARY CARE PROVIDER: Meredeth Ide, NP [facility]  REFERRING PROVIDER: [facility] Amber Fowler  RESPONSIBLE PARTY:  Son, Dr. Jeanann Fowler    RECOMMENDATIONS and PLAN:  1. Weakness: secondary to advanced age, lumbar stenosis, arthritis and worsening memory. She has lost some weight and is demonstrating some facial wasting. Recommendations are that she continue in this AL setting with daily sitter. She may need sitters at night as well at some point. She and her son are very happy with this facility and their care. Expect memory and cognitive ability to decline as a natural progression. 2. ACP: DNR form on the chart. MOST form completed. Son, Amber Fowler still desires hospitalization if needed for abx therapy and IV fluids. No peg tube. No ventilation.    I spent 30 minutes providing this consultation,  from 10:00  to 10:30 am. More than 50% of the time in this consultation was spent reviewing records, interviewing staff and discussing goals of care with patient's son and coordinating communication.   HISTORY OF PRESENT ILLNESS:  Amber Fowler is a 84 y.o.  female with multiple medical problems who was recently referred to  Palliative Care for any symptom management needs or goals of care. Today's visit scheduled with patient's son to discuss MOST form.   CODE STATUS: DNR  PPS: 30% HOSPICE ELIGIBILITY/DIAGNOSIS: TBD  PAST MEDICAL HISTORY:  Past Medical History:  Diagnosis Date  . Alzheimer disease    /notes 01/09/2015  . Anemia   . Cancer of right breast ([place])   . Depression   . Glaucoma   . Hyperlipidemia   . Hypertension   . Hypothyroid   . Lumbar stenosis   . Osteoarthritis   . Osteoarthritis   . Subdural hematoma ([place])   . Thyroid disease   . Vitamin E deficiency     SOCIAL HX:  Social History   Tobacco Use  . Smoking status: Never Smoker  . Smokeless tobacco: Never Used  Substance  Use Topics  . Alcohol use: No    ALLERGIES: No Known Allergies   PERTINENT MEDICATIONS:  Outpatient Encounter Medications as of 12/18/2017  Medication Sig  . Acetaminophen-Codeine (TYLENOL/CODEINE #3) 300-30 MG tablet Take 1 tablet by mouth every 4 (four) hours as needed for pain (severe pain).  . Amino Acids-Protein Hydrolys (FEEDING SUPPLEMENT, PRO-STAT SUGAR FREE 64,) LIQD Take 30 mLs by mouth 2 (two) times daily.  [name] amoxicillin (AMOXIL) 500 MG capsule Take 2,000 mg by mouth See admin instructions. One hour prior to dental procedure(s)  . amoxicillin-clavulanate (AUGMENTIN) 875-125 MG tablet Take 1 tablet by mouth 2 (two) times daily.  [name] atorvastatin (LIPITOR) 20 MG tablet Take 20 mg by mouth at bedtime.  . carboxymethylcellulose 1 % ophthalmic solution Place 1 drop into both eyes 3 (three) times daily.  . Cholecalciferol (VITAMIN D) 2000 units CAPS Take 2,000 Units by mouth at bedtime.  . docusate sodium (COLACE) 100 MG capsule Take 100 mg by mouth 2 (two) times daily.  . DULoxetine (CYMBALTA) 20 MG capsule Take 20 mg by mouth daily.  . feeding supplement, ENSURE ENLIVE, (ENSURE ENLIVE) LIQD Take 237 mLs by mouth 3 (three) times daily between meals.  [name] HYDROcodone-acetaminophen (NORCO/VICODIN) 5-325 MG per tablet Take 1 tablet by mouth every 8 (eight) hours as needed for moderate pain.  . iron polysaccharides (NIFEREX) 150 MG capsule Take 1 capsule (150 mg total) by mouth daily.  [name] latanoprost (  XALATAN) 0.005 % ophthalmic solution Place 1 drop into both eyes at bedtime.  [name] levothyroxine (SYNTHROID, LEVOTHROID) 50 MCG tablet Take 50 mcg by mouth every morning.  [name] lisinopril (PRINIVIL,ZESTRIL) 20 MG tablet Take 20 mg by mouth daily.  . miconazole (BAZA ANTIFUNGAL) 2 % cream Apply 1 application topically 3 (three) times daily.  . Multiple Vitamins-Minerals (PRESERVISION AREDS 2 PO) Take 1 capsule by mouth 2 (two) times daily.  . nitrofurantoin, macrocrystal-monohydrate, (MACROBID) 100 MG  capsule Take 1 capsule (100 mg total) by mouth 2 (two) times daily.  . Omega-3 Fatty Acids (FISH OIL) 1000 MG CAPS Take 1,000 mg by mouth at bedtime.  Heide Spark Shell 500 MG TABS Take 1 tablet by mouth 2 (two) times daily.  . polyethylene glycol (MIRALAX / GLYCOLAX) packet Take 17 g by mouth daily as needed for mild constipation.  . polyvinyl alcohol (LIQUIFILM TEARS) 1.4 % ophthalmic solution Place 1 drop into both eyes 2 (two) times daily as needed (for irritation or blurred vision).  . rivaroxaban (XARELTO) 10 MG TABS tablet Take 1 tablet (10 mg total) by mouth daily.  . timolol (BETIMOL) 0.5 % ophthalmic solution Place 1 drop into both eyes daily.   [name] UNABLE TO FIND Take 1 each by mouth 2 (two) times daily. Med Name: Magic Cup At 10 am and at 2 pm   No facility-administered encounter medications on file as of 12/18/2017.     PHYSICAL EXAM:   Last weight 163.6#, 97.7, 76, 20, 140/68  General: Alert, AA female in NAD Cardiovascular: irreg rhythm; reg rate Pulmonary: CTAB; no cough or wheeze Abdomen: NTTP Extremities: weak legs; unable to walk/hands contracted Skin: Fragile Neurological: alert; speaks in sentences; word searches; +generalized weakness  Nathanial Rancher, NP

## 2017-12-29 ENCOUNTER — Non-Acute Institutional Stay: Payer: Medicare Other | Admitting: Hospice and Palliative Medicine

## 2017-12-29 DIAGNOSIS — Z515 Encounter for palliative care: Secondary | ICD-10-CM

## 2017-12-30 NOTE — Progress Notes (Signed)
PALLIATIVE CARE CONSULT VISIT   PATIENT NAME: Amber Fowler DOB: 1919/04/25 MRN: [id number]  PRIMARY CARE PROVIDER: [facility]  REFERRING PROVIDER: [facility], [place], [place] [address]  RESPONSIBLE PARTY:   Son   RECOMMENDATIONS and PLAN:  1. Weakness: secondary to advanced age and worsening dementia with contractured hands and arthritis. She is getting excellent care in one of the best AL's in the city with continuous caregivers throughout most of the day. No recommendations to make. Symptoms are well managed. 2. ACP: met with patient's son last visit to discuss DNR/MOST form status. Completed forms. Both placed on the chart. No feeding tubes or ventilator. IV abx and IV fluids if needed.   I spent 15 minutes providing this consultation,  from 12 to 12:15pm. More than 50% of the time in this consultation was spent interviewing patient and caregiver and coordinating communication.   HISTORY OF PRESENT ILLNESS:  Amber Fowler is a 84 y.o. female with multiple medical problems. . Palliative Care was asked to help address symptom management and goals of care. Today's visit is routine to determine if any decline in condition.  CODE STATUS: DNR  PPS: 30% HOSPICE ELIGIBILITY/DIAGNOSIS: TBD  PAST MEDICAL HISTORY:  Past Medical History:  Diagnosis Date  . Alzheimer disease    /notes 01/09/2015  . Anemia   . Cancer of right breast ([place])   . Depression   . Glaucoma   . Hyperlipidemia   . Hypertension   . Hypothyroid   . Lumbar stenosis   . Osteoarthritis   . Osteoarthritis   . Subdural hematoma ([place])   . Thyroid disease   . Vitamin E deficiency     SOCIAL HX:  Social History   Tobacco Use  . Smoking status: Never Smoker  . Smokeless tobacco: Never Used  Substance Use Topics  . Alcohol use: No    ALLERGIES: No Known Allergies   PERTINENT MEDICATIONS:  Outpatient Encounter Medications as of 12/29/2017  Medication Sig  .  Acetaminophen-Codeine (TYLENOL/CODEINE #3) 300-30 MG tablet Take 1 tablet by mouth every 4 (four) hours as needed for pain (severe pain).  . Amino Acids-Protein Hydrolys (FEEDING SUPPLEMENT, PRO-STAT SUGAR FREE 64,) LIQD Take 30 mLs by mouth 2 (two) times daily.  [name] amoxicillin (AMOXIL) 500 MG capsule Take 2,000 mg by mouth See admin instructions. One hour prior to dental procedure(s)  . amoxicillin-clavulanate (AUGMENTIN) 875-125 MG tablet Take 1 tablet by mouth 2 (two) times daily.  [name] atorvastatin (LIPITOR) 20 MG tablet Take 20 mg by mouth at bedtime.  . carboxymethylcellulose 1 % ophthalmic solution Place 1 drop into both eyes 3 (three) times daily.  . Cholecalciferol (VITAMIN D) 2000 units CAPS Take 2,000 Units by mouth at bedtime.  . docusate sodium (COLACE) 100 MG capsule Take 100 mg by mouth 2 (two) times daily.  . DULoxetine (CYMBALTA) 20 MG capsule Take 20 mg by mouth daily.  . feeding supplement, ENSURE ENLIVE, (ENSURE ENLIVE) LIQD Take 237 mLs by mouth 3 (three) times daily between meals.  [name] HYDROcodone-acetaminophen (NORCO/VICODIN) 5-325 MG per tablet Take 1 tablet by mouth every 8 (eight) hours as needed for moderate pain.  . iron polysaccharides (NIFEREX) 150 MG capsule Take 1 capsule (150 mg total) by mouth daily.  [name] latanoprost (XALATAN) 0.005 % ophthalmic solution Place 1 drop into both eyes at bedtime.  [name] levothyroxine (SYNTHROID, LEVOTHROID) 50 MCG tablet Take 50 mcg by mouth every morning.  [name] lisinopril (PRINIVIL,ZESTRIL) 20 MG  tablet Take 20 mg by mouth daily.  . miconazole (BAZA ANTIFUNGAL) 2 % cream Apply 1 application topically 3 (three) times daily.  . Multiple Vitamins-Minerals (PRESERVISION AREDS 2 PO) Take 1 capsule by mouth 2 (two) times daily.  . nitrofurantoin, macrocrystal-monohydrate, (MACROBID) 100 MG capsule Take 1 capsule (100 mg total) by mouth 2 (two) times daily.  . Omega-3 Fatty Acids (FISH OIL) 1000 MG CAPS Take 1,000 mg by mouth at bedtime.  Heide Spark Shell  500 MG TABS Take 1 tablet by mouth 2 (two) times daily.  . polyethylene glycol (MIRALAX / GLYCOLAX) packet Take 17 g by mouth daily as needed for mild constipation.  . polyvinyl alcohol (LIQUIFILM TEARS) 1.4 % ophthalmic solution Place 1 drop into both eyes 2 (two) times daily as needed (for irritation or blurred vision).  . rivaroxaban (XARELTO) 10 MG TABS tablet Take 1 tablet (10 mg total) by mouth daily.  . timolol (BETIMOL) 0.5 % ophthalmic solution Place 1 drop into both eyes daily.   [name] UNABLE TO FIND Take 1 each by mouth 2 (two) times daily. Med Name: Magic Cup At 10 am and at 2 pm   No facility-administered encounter medications on file as of 12/29/2017.     PHYSICAL EXAM:   General: alert, thin, elderly AA female sitting in dining room finishing dessert Cardiovascular: did not auscultate Pulmonary: breathing easily; no cough on exam Extremities: legs weak on standing; hands contracted Skin: thin, easily bruised/torn Neurological: alert; speaks in sentences, + generalized weakness  Nathanial Rancher, NP

## 2018-06-14 ENCOUNTER — Encounter: Payer: Self-pay | Admitting: Internal Medicine

## 2018-06-14 ENCOUNTER — Non-Acute Institutional Stay: Payer: Medicare Other | Admitting: Internal Medicine

## 2018-06-14 VITALS — BP 150/80 | HR 60 | Resp 12 | Wt 114.2 lb

## 2018-06-14 DIAGNOSIS — R531 Weakness: Secondary | ICD-10-CM

## 2018-06-14 NOTE — Progress Notes (Signed)
[facility] Telephone: [phone] Fax: [phone]  PATIENT NAME: Amber Fowler DOB: October 07, 1918 MRN: [id number]  PRIMARY CARE PROVIDER:   [facility]  REFERRING PROVIDER:  [facility] [address] [place], [place] [address]  RESPONSIBLE PARTY:   Son: Dr. Jeanann Lewandowsky  ASSESSMENT / RECOMMENDATIONS:     1. Weakness related to advanced age. She is wheelchair confined; able to pivot with 1-2 person heavy assist. Unable to take steps. She has a paid care provider Mon-Fri. She has a good oral intake and feeds herself (with minimal assist) a mechanica soft diet. Her last weight in Oct was 114.2lbs, which is fairly stable.   2. Cognitive decline: She is pleasantly conversant, and  is alert and oriented to self. Patient's sitter notes the patient is more alert these last few months. Patient may have occasional visual hallucinations when first awakening from sleep. Staff report episodic yelling out in the evenings, well managed with scheduled  Xanax in the afternoons.  3. Advanced Care Planning : DNR/MOST forms on chart  4. F/U Palliative Care NP visit 2-4 months; monitor for signs of functional or nutritional decline  I spent 15 minutes providing this consultation,  from 3pm to 3:30pm. More than 50% of the time in this consultation was spent coordinating communication.   HISTORY OF PRESENT ILLNESS:  Amber Fowler is a 84 y.o. year old female with Alzheimer's Disease and HTN. Palliative Care was asked to help address goals of care. Today's visit is routine to determine if any decline in condition,   CODE STATUS: DNR and MOST form on chart. Details MOST: Limited additional medical interventions. Yes to IVFs and IV antibiotics. No feeding tube.   PPS: 30% HOSPICE ELIGIBILITY/DIAGNOSIS: TBD  PAST MEDICAL HISTORY:  Past Medical History:  Diagnosis Date  . Alzheimer disease    /notes 01/09/2015  . Anemia   . Cancer of right breast ([facility])   . Depression    . Glaucoma   . Hyperlipidemia   . Hypertension   . Hypothyroid   . Lumbar stenosis   . Osteoarthritis   . Osteoarthritis   . Subdural hematoma ([facility])   . Thyroid disease   . Vitamin E deficiency     SOCIAL HX:  Social History   Tobacco Use  . Smoking status: Never Smoker  . Smokeless tobacco: Never Used  Substance Use Topics  . Alcohol use: No    ALLERGIES: No Known Allergies   PERTINENT MEDICATIONS:  Outpatient Encounter Medications as of 06/14/2018  Medication Sig  . ALPRAZolam (XANAX) 0.25 MG tablet Take 0.25 mg by mouth 1 day or 1 dose. At 4pm  . amoxicillin (AMOXIL) 500 MG capsule Take 2,000 mg by mouth See admin instructions. One hour prior to dental procedure(s)  . Cholecalciferol (VITAMIN D) 2000 units CAPS Take 2,000 Units by mouth at bedtime.  . docusate sodium (COLACE) 100 MG capsule Take 100 mg by mouth 2 (two) times daily.  . DULoxetine (CYMBALTA) 20 MG capsule Take 20 mg by mouth daily.  [name] latanoprost (XALATAN) 0.005 % ophthalmic solution Place 1 drop into both eyes at bedtime.  [name] levothyroxine (SYNTHROID, LEVOTHROID) 50 MCG tablet Take 50 mcg by mouth every morning. 0.5 tab (36mcg) every morning  . Lidocaine HCl (ASPERCREME LIDOCAINE) 4 % LIQD Apply topically.  [name] lisinopril (PRINIVIL,ZESTRIL) 20 MG tablet Take 20 mg by mouth daily.  [name] LORazepam (ATIVAN) 0.5 MG tablet Take 0.5 mg by mouth daily as needed for anxiety. Take one  tablet by mouth once daily as needed for anxiety  . miconazole (BAZA ANTIFUNGAL) 2 % cream Apply 1 application topically 3 (three) times daily.  . mineral oil external liquid 2 drops by Does not apply route. Instill 2 drops into each ear once weekly  . polyvinyl alcohol (LIQUIFILM TEARS) 1.4 % ophthalmic solution Place 1 drop into both eyes 2 (two) times daily as needed (for irritation or blurred vision).  . [DISCONTINUED] Acetaminophen-Codeine (TYLENOL/CODEINE #3) 300-30 MG tablet Take 1 tablet by mouth every 4 (four) hours as needed for  pain (severe pain). (Patient not taking: Reported on 06/14/2018)  . [DISCONTINUED] Amino Acids-Protein Hydrolys (FEEDING SUPPLEMENT, PRO-STAT SUGAR FREE 64,) LIQD Take 30 mLs by mouth 2 (two) times daily.  . [DISCONTINUED] amoxicillin-clavulanate (AUGMENTIN) 875-125 MG tablet Take 1 tablet by mouth 2 (two) times daily. (Patient not taking: Reported on 06/14/2018)  . [DISCONTINUED] atorvastatin (LIPITOR) 20 MG tablet Take 20 mg by mouth at bedtime.  . [DISCONTINUED] carboxymethylcellulose 1 % ophthalmic solution Place 1 drop into both eyes 3 (three) times daily.  . [DISCONTINUED] feeding supplement, ENSURE ENLIVE, (ENSURE ENLIVE) LIQD Take 237 mLs by mouth 3 (three) times daily between meals. (Patient not taking: Reported on 06/14/2018)  . [DISCONTINUED] HYDROcodone-acetaminophen (NORCO/VICODIN) 5-325 MG per tablet Take 1 tablet by mouth every 8 (eight) hours as needed for moderate pain. (Patient not taking: Reported on 06/14/2018)  . [DISCONTINUED] iron polysaccharides (NIFEREX) 150 MG capsule Take 1 capsule (150 mg total) by mouth daily. (Patient not taking: Reported on 06/14/2018)  . [DISCONTINUED] Multiple Vitamins-Minerals (PRESERVISION AREDS 2 PO) Take 1 capsule by mouth 2 (two) times daily.  . [DISCONTINUED] nitrofurantoin, macrocrystal-monohydrate, (MACROBID) 100 MG capsule Take 1 capsule (100 mg total) by mouth 2 (two) times daily. (Patient not taking: Reported on 06/14/2018)  . [DISCONTINUED] Omega-3 Fatty Acids (FISH OIL) 1000 MG CAPS Take 1,000 mg by mouth at bedtime.  . [DISCONTINUED] Oyster Shell 500 MG TABS Take 1 tablet by mouth 2 (two) times daily.  . [DISCONTINUED] polyethylene glycol (MIRALAX / GLYCOLAX) packet Take 17 g by mouth daily as needed for mild constipation.  . [DISCONTINUED] rivaroxaban (XARELTO) 10 MG TABS tablet Take 1 tablet (10 mg total) by mouth daily. (Patient not taking: Reported on 06/14/2018)  . [DISCONTINUED] Skin Protectants, Misc. (DIMETHICONE-ZINC OXIDE) cream  Apply topically daily as needed for dry skin. Apply to buttocks an coccyx with every brief change  . [DISCONTINUED] timolol (BETIMOL) 0.5 % ophthalmic solution Place 1 drop into both eyes daily.   . [DISCONTINUED] UNABLE TO FIND Take 1 each by mouth 2 (two) times daily. Med Name: Magic Cup At 10 am and at 2 pm   No facility-administered encounter medications on file as of 06/14/2018.     PHYSICAL EXAM:  VA BP 850 systolic, HR 60, RR 12 General: NAD, frail appearing, thin Cardiovascular: regular rate and rhythm with systolic murmur Pulmonary: clear ant fields Abdomen: soft, nontender, + bowel sounds Extremities: no edema, no joint deformities. Diminished pedal pulses. Skin: no rashes Neurological: Weakness but otherwise nonfocal  Julianne Handler, NP

## 2018-07-23 ENCOUNTER — Non-Acute Institutional Stay: Payer: Medicare Other | Admitting: Internal Medicine

## 2018-07-23 ENCOUNTER — Encounter: Payer: Self-pay | Admitting: Internal Medicine

## 2018-07-23 VITALS — BP 122/80 | Resp 12 | Wt 118.2 lb

## 2018-07-23 DIAGNOSIS — Z515 Encounter for palliative care: Secondary | ICD-10-CM

## 2018-07-23 NOTE — Progress Notes (Addendum)
[facility] Telephone: [phone] Fax: [phone]  PATIENT NAME: Amber Fowler DOB: Feb 27, 1919 MRN: [id number]  PRIMARY CARE PROVIDER:   [facility]  REFERRING PROVIDER:  [facility], [place], [place] [address]  RESPONSIBLE PARTY:   (son) Dr. Jeanann Lewandowsky [phone], [phone]. (dtr) Amber Fowler [phone], [phone].  ASSESSMENT / RECOMMENDATIONS:     1. Weakness related to advanced age. She is wheelchair confined; able to pivot with 1-2 person heavy assist. Unable to take steps. She has a paid care provider ([facility]) Mon-Fri. She has a good oral intake and feeds herself (with minimal assist) a mechanica soft diet. I was unable to find the weight book. Current weight is 118.2lbs, which is up 4lbs over the past month, and reflects her weight 7 months ago.   2. Cognitive decline: (FAST 6c) Patient is pleasantly conversant, and  is alert and oriented to self. Patient's sitter reports patient is doing well, without visual hallucinations which occasionally happened in the past (upon first awakening). She is occasionally resistant to care. Patient is on Xanax 0.25mg  q afternoon, Cymbalta 20 mg qd, and prn Ativen 0.5mg  (non administered over these past 4 weeks).  3. Advanced Care Planning : DNR/MOST forms on chart  4. F/U Palliative Care NP visit 2-4 months; monitor for signs of functional or nutritional decline. I spoke to patient's daughter Amber Fowler with patient updates.  I spent 60 minutes providing this consultation,  from 12pm to 1pm. More than 50% of the time in this consultation was spent coordinating communication, and included contacting family members and updating medication list.  HISTORY OF PRESENT ILLNESS:  Amber Fowler is a 84 y.o. year old female with Alzheimer's Disease and HTN. Palliative Care was asked to help address goals of care. Today's visit is routine to determine if any decline in condition,    CODE STATUS: DNR and MOST form on chart. Details MOST: Limited additional medical interventions. Yes to IVFs and IV antibiotics. No feeding tube.   PPS: 30% HOSPICE ELIGIBILITY/DIAGNOSIS: TBD  PAST MEDICAL HISTORY:  Past Medical History:  Diagnosis Date  . Alzheimer disease ([facility])    [name] 01/09/2015  . Anemia   . Cancer of right breast ([facility])   . Depression   . Glaucoma   . Hyperlipidemia   . Hypertension   . Hypothyroid   . Lumbar stenosis   . Osteoarthritis   . Osteoarthritis   . Subdural hematoma ([facility])   . Thyroid disease   . Vitamin E deficiency     SOCIAL HX:  Social History   Tobacco Use  . Smoking status: Never Smoker  . Smokeless tobacco: Never Used  Substance Use Topics  . Alcohol use: No    ALLERGIES: No Known Allergies   PERTINENT MEDICATIONS:  Outpatient Encounter Medications as of 07/23/2018  Medication Sig  . ALPRAZolam (XANAX) 0.25 MG tablet Take 0.25 mg by mouth 1 day or 1 dose. At 4pm  . amoxicillin (AMOXIL) 500 MG capsule Take 2,000 mg by mouth See admin instructions. One hour prior to dental procedure(s)  . Cholecalciferol (VITAMIN D) 2000 units CAPS Take 2,000 Units by mouth at bedtime.  . docusate sodium (COLACE) 100 MG capsule Take 100 mg by mouth 2 (two) times daily.  . DULoxetine (CYMBALTA) 20 MG capsule Take 20 mg by mouth daily.  [name] latanoprost (XALATAN) 0.005 % ophthalmic solution Place 1 drop into both eyes at bedtime.  [name] levothyroxine (SYNTHROID, LEVOTHROID) 50  MCG tablet Take 50 mcg by mouth every morning. 0.5 tab (7mcg) every morning  . Lidocaine HCl (ASPERCREME LIDOCAINE) 4 % LIQD Apply topically.  [name] lisinopril (PRINIVIL,ZESTRIL) 20 MG tablet Take 20 mg by mouth daily.  [name] LORazepam (ATIVAN) 0.5 MG tablet Take 0.5 mg by mouth daily as needed for anxiety. Take one tablet by mouth once daily as needed for anxiety  . miconazole (BAZA ANTIFUNGAL) 2 % cream Apply 1 application topically 3 (three) times daily.  . mineral oil external  liquid 2 drops by Does not apply route. Instill 2 drops into each ear once weekly  . polyvinyl alcohol (LIQUIFILM TEARS) 1.4 % ophthalmic solution Place 1 drop into both eyes 2 (two) times daily as needed (for irritation or blurred vision).   No facility-administered encounter medications on file as of 07/23/2018.     PHYSICAL EXAM:   General: NAD, frail appearing, thin Cardiovascular: regular rate and rhythm Pulmonary: clear ant fields Abdomen: soft, nontender, + bowel sounds GU: no suprapubic tenderness Extremities: no edema, no joint deformities Skin: no rashes Neurological: Weakness but otherwise nonfocal  Julianne Handler, NP

## 2019-07-15 ENCOUNTER — Other Ambulatory Visit: Payer: Self-pay

## 2019-07-15 ENCOUNTER — Telehealth: Payer: Self-pay

## 2019-07-15 ENCOUNTER — Inpatient Hospital Stay (HOSPITAL_COMMUNITY)
Admission: EM | Admit: 2019-07-15 | Discharge: 2019-07-18 | DRG: 082 | Disposition: A | Discharge status: Hospice | Payer: Medicare Other | Source: Skilled Nursing Facility | Attending: Family Medicine | Admitting: Family Medicine

## 2019-07-15 ENCOUNTER — Emergency Department (HOSPITAL_COMMUNITY): Payer: Medicare Other

## 2019-07-15 ENCOUNTER — Encounter (HOSPITAL_COMMUNITY): Payer: Medicare Other

## 2019-07-15 ENCOUNTER — Encounter (HOSPITAL_COMMUNITY): Payer: Self-pay

## 2019-07-15 ENCOUNTER — Inpatient Hospital Stay (HOSPITAL_COMMUNITY): Payer: Medicare Other

## 2019-07-15 DIAGNOSIS — E039 Hypothyroidism, unspecified: Secondary | ICD-10-CM | POA: Diagnosis present

## 2019-07-15 DIAGNOSIS — S022XXA Fracture of nasal bones, initial encounter for closed fracture: Secondary | ICD-10-CM | POA: Diagnosis present

## 2019-07-15 DIAGNOSIS — Z9011 Acquired absence of right breast and nipple: Secondary | ICD-10-CM | POA: Diagnosis not present

## 2019-07-15 DIAGNOSIS — I10 Essential (primary) hypertension: Secondary | ICD-10-CM | POA: Diagnosis present

## 2019-07-15 DIAGNOSIS — W19XXXA Unspecified fall, initial encounter: Secondary | ICD-10-CM | POA: Diagnosis present

## 2019-07-15 DIAGNOSIS — I6389 Other cerebral infarction: Secondary | ICD-10-CM | POA: Diagnosis not present

## 2019-07-15 DIAGNOSIS — I63532 Cerebral infarction due to unspecified occlusion or stenosis of left posterior cerebral artery: Secondary | ICD-10-CM | POA: Diagnosis present

## 2019-07-15 DIAGNOSIS — S42101A Fracture of unspecified part of scapula, right shoulder, initial encounter for closed fracture: Secondary | ICD-10-CM | POA: Diagnosis present

## 2019-07-15 DIAGNOSIS — D320 Benign neoplasm of cerebral meninges: Secondary | ICD-10-CM | POA: Diagnosis present

## 2019-07-15 DIAGNOSIS — I639 Cerebral infarction, unspecified: Secondary | ICD-10-CM

## 2019-07-15 DIAGNOSIS — Z66 Do not resuscitate: Secondary | ICD-10-CM | POA: Diagnosis present

## 2019-07-15 DIAGNOSIS — F0280 Dementia in other diseases classified elsewhere without behavioral disturbance: Secondary | ICD-10-CM | POA: Diagnosis present

## 2019-07-15 DIAGNOSIS — S066X9A Traumatic subarachnoid hemorrhage with loss of consciousness of unspecified duration, initial encounter: Secondary | ICD-10-CM | POA: Diagnosis present

## 2019-07-15 DIAGNOSIS — S0240DA Maxillary fracture, left side, initial encounter for closed fracture: Secondary | ICD-10-CM | POA: Diagnosis present

## 2019-07-15 DIAGNOSIS — Z96641 Presence of right artificial hip joint: Secondary | ICD-10-CM | POA: Diagnosis present

## 2019-07-15 DIAGNOSIS — Z20828 Contact with and (suspected) exposure to other viral communicable diseases: Secondary | ICD-10-CM | POA: Diagnosis present

## 2019-07-15 DIAGNOSIS — G309 Alzheimer's disease, unspecified: Secondary | ICD-10-CM | POA: Diagnosis present

## 2019-07-15 DIAGNOSIS — I63332 Cerebral infarction due to thrombosis of left posterior cerebral artery: Secondary | ICD-10-CM | POA: Diagnosis not present

## 2019-07-15 DIAGNOSIS — Z515 Encounter for palliative care: Secondary | ICD-10-CM | POA: Diagnosis not present

## 2019-07-15 DIAGNOSIS — Z853 Personal history of malignant neoplasm of breast: Secondary | ICD-10-CM

## 2019-07-15 DIAGNOSIS — S065XAA Traumatic subdural hemorrhage with loss of consciousness status unknown, initial encounter: Secondary | ICD-10-CM

## 2019-07-15 DIAGNOSIS — Z993 Dependence on wheelchair: Secondary | ICD-10-CM

## 2019-07-15 DIAGNOSIS — F329 Major depressive disorder, single episode, unspecified: Secondary | ICD-10-CM | POA: Diagnosis present

## 2019-07-15 DIAGNOSIS — I609 Nontraumatic subarachnoid hemorrhage, unspecified: Secondary | ICD-10-CM | POA: Diagnosis present

## 2019-07-15 DIAGNOSIS — Y92099 Unspecified place in other non-institutional residence as the place of occurrence of the external cause: Secondary | ICD-10-CM

## 2019-07-15 DIAGNOSIS — S065X0A Traumatic subdural hemorrhage without loss of consciousness, initial encounter: Secondary | ICD-10-CM | POA: Diagnosis not present

## 2019-07-15 DIAGNOSIS — S065X9A Traumatic subdural hemorrhage with loss of consciousness of unspecified duration, initial encounter: Secondary | ICD-10-CM

## 2019-07-15 DIAGNOSIS — R400 Somnolence: Secondary | ICD-10-CM | POA: Diagnosis not present

## 2019-07-15 DIAGNOSIS — R4182 Altered mental status, unspecified: Secondary | ICD-10-CM | POA: Diagnosis present

## 2019-07-15 LAB — DIFFERENTIAL
Abs Immature Granulocytes: 0.04 10*3/uL (ref 0.00–0.07)
Basophils Absolute: 0 10*3/uL (ref 0.0–0.1)
Basophils Relative: 0 %
Eosinophils Absolute: 0 10*3/uL (ref 0.0–0.5)
Eosinophils Relative: 0 %
Immature Granulocytes: 0 %
Lymphocytes Relative: 10 %
Lymphs Abs: 1 10*3/uL (ref 0.7–4.0)
Monocytes Absolute: 1 10*3/uL (ref 0.1–1.0)
Monocytes Relative: 10 %
Neutro Abs: 8 10*3/uL — ABNORMAL HIGH (ref 1.7–7.7)
Neutrophils Relative %: 80 %

## 2019-07-15 LAB — I-STAT CHEM 8, ED
BUN: 21 mg/dL (ref 8–23)
Calcium, Ion: 1.2 mmol/L (ref 1.15–1.40)
Chloride: 107 mmol/L (ref 98–111)
Creatinine, Ser: 0.7 mg/dL (ref 0.44–1.00)
Glucose, Bld: 124 mg/dL — ABNORMAL HIGH (ref 70–99)
HCT: 45 % (ref 36.0–46.0)
Hemoglobin: 15.3 g/dL — ABNORMAL HIGH (ref 12.0–15.0)
Potassium: 5.4 mmol/L — ABNORMAL HIGH (ref 3.5–5.1)
Sodium: 143 mmol/L (ref 135–145)
TCO2: 32 mmol/L (ref 22–32)

## 2019-07-15 LAB — COMPREHENSIVE METABOLIC PANEL
ALT: 10 U/L (ref 0–44)
AST: 30 U/L (ref 15–41)
Albumin: 3.3 g/dL — ABNORMAL LOW (ref 3.5–5.0)
Alkaline Phosphatase: 51 U/L (ref 38–126)
Anion gap: 10 (ref 5–15)
BUN: 12 mg/dL (ref 8–23)
CO2: 24 mmol/L (ref 22–32)
Calcium: 9.2 mg/dL (ref 8.9–10.3)
Chloride: 108 mmol/L (ref 98–111)
Creatinine, Ser: 0.68 mg/dL (ref 0.44–1.00)
GFR calc Af Amer: >60 mL/min (ref >60)
GFR calc non Af Amer: >60 mL/min (ref >60)
Glucose, Bld: 106 mg/dL — ABNORMAL HIGH (ref 70–99)
Potassium: 4.3 mmol/L (ref 3.5–5.1)
Sodium: 142 mmol/L (ref 135–145)
Total Bilirubin: 0.8 mg/dL (ref 0.3–1.2)
Total Protein: 7.2 g/dL (ref 6.5–8.1)

## 2019-07-15 LAB — URINALYSIS, ROUTINE W REFLEX MICROSCOPIC
Bilirubin Urine: NEGATIVE
Glucose, UA: NEGATIVE mg/dL
Ketones, ur: 20 mg/dL — AB
Leukocytes,Ua: NEGATIVE
Nitrite: NEGATIVE
Protein, ur: 30 mg/dL — AB
Specific Gravity, Urine: 1.015 (ref 1.005–1.030)
pH: 6 (ref 5.0–8.0)

## 2019-07-15 LAB — CBC
HCT: 43.5 % (ref 36.0–46.0)
Hemoglobin: 13.8 g/dL (ref 12.0–15.0)
MCH: 30.5 pg (ref 26.0–34.0)
MCHC: 31.7 g/dL (ref 30.0–36.0)
MCV: 96.2 fL (ref 80.0–100.0)
Platelets: 206 10*3/uL (ref 150–400)
RBC: 4.52 MIL/uL (ref 3.87–5.11)
RDW: 13.7 % (ref 11.5–15.5)
WBC: 10.2 10*3/uL (ref 4.0–10.5)
nRBC: 0 % (ref 0.0–0.2)

## 2019-07-15 LAB — MAGNESIUM: Magnesium: 2 mg/dL (ref 1.7–2.4)

## 2019-07-15 LAB — APTT: aPTT: 31 seconds (ref 24–36)

## 2019-07-15 LAB — CBG MONITORING, ED: Glucose-Capillary: 111 mg/dL — ABNORMAL HIGH (ref 70–99)

## 2019-07-15 LAB — POC SARS CORONAVIRUS 2 AG -  ED: SARS Coronavirus 2 Ag: NEGATIVE

## 2019-07-15 LAB — PROTIME-INR
INR: 1 (ref 0.8–1.2)
Prothrombin Time: 13.5 seconds (ref 11.4–15.2)

## 2019-07-15 LAB — ECHOCARDIOGRAM COMPLETE

## 2019-07-15 MED ORDER — LATANOPROST 0.005 % OP SOLN
1.0000 [drp] | Freq: Every day | OPHTHALMIC | Status: DC
  Administered 2019-07-15 – 2019-07-17 (×3): 1 [drp] via OPHTHALMIC
  Filled 2019-07-15: qty 2.5

## 2019-07-15 MED ORDER — SODIUM CHLORIDE 0.9 % IV SOLN: INTRAVENOUS | Status: DC

## 2019-07-15 MED ORDER — DOCUSATE SODIUM 100 MG PO CAPS: 100.0000 mg | ORAL_CAPSULE | Freq: Two times a day (BID) | ORAL | Status: DC

## 2019-07-15 MED ORDER — POLYVINYL ALCOHOL 1.4 % OP SOLN: 1.0000 [drp] | Freq: Two times a day (BID) | OPHTHALMIC | Status: DC | PRN

## 2019-07-15 MED ORDER — ALPRAZOLAM 0.25 MG PO TABS: 0.2500 mg | ORAL_TABLET | Freq: Every day | ORAL | Status: DC | PRN

## 2019-07-15 MED ORDER — STROKE: EARLY STAGES OF RECOVERY BOOK: Freq: Once | Status: DC

## 2019-07-15 MED ORDER — ACETAMINOPHEN 650 MG RE SUPP: 650.0000 mg | RECTAL | Status: DC | PRN

## 2019-07-15 MED ORDER — NIFEDIPINE 10 MG PO CAPS
10.0000 mg | ORAL_CAPSULE | Freq: Three times a day (TID) | ORAL | Status: DC
  Filled 2019-07-15 (×4): qty 1

## 2019-07-15 MED ORDER — SENNOSIDES-DOCUSATE SODIUM 8.6-50 MG PO TABS: 1.0000 | ORAL_TABLET | Freq: Every evening | ORAL | Status: DC | PRN

## 2019-07-15 MED ORDER — LORAZEPAM 1 MG PO TABS: 0.5000 mg | ORAL_TABLET | Freq: Every day | ORAL | Status: DC | PRN

## 2019-07-15 MED ORDER — DULOXETINE HCL 20 MG PO CPEP
20.0000 mg | ORAL_CAPSULE | Freq: Every day | ORAL | Status: DC
  Administered 2019-07-16 – 2019-07-17 (×2): 20 mg via ORAL
  Filled 2019-07-15 (×2): qty 1

## 2019-07-15 MED ORDER — LISINOPRIL 20 MG PO TABS: 20.0000 mg | ORAL_TABLET | Freq: Every day | ORAL | Status: DC

## 2019-07-15 MED ORDER — ACETAMINOPHEN 160 MG/5ML PO SOLN: 650.0000 mg | ORAL | Status: DC | PRN

## 2019-07-15 MED ORDER — ACETAMINOPHEN 325 MG PO TABS: 650.0000 mg | ORAL_TABLET | ORAL | Status: DC | PRN

## 2019-07-15 MED ORDER — LEVOTHYROXINE SODIUM 50 MCG PO TABS
50.0000 ug | ORAL_TABLET | Freq: Every day | ORAL | Status: DC
  Administered 2019-07-17 – 2019-07-18 (×2): 50 ug via ORAL
  Filled 2019-07-15 (×2): qty 1

## 2019-07-15 NOTE — ED Provider Notes (Signed)
[facility] EMERGENCY DEPARTMENT Provider Note   CSN: [id number] Arrival date & time: 07/15/19  1152     History Chief Complaint  Patient presents with   Nose bleed & seizure-like activity    Amber Fowler is a 85 y.o. female history Alzheimer's dementia, breast cancer, hypertension, hyperlipidemia, arthritis, subdural hematoma, DNR.  History obtained per triage RN, EMS has left, level 5 caveat due to dementia.  Per ER RN EMS reported, nonverbal bedbound dementia patient sent in by 24-hour at home care staff.  Staff noticed this morning patient had a small amount of epistaxis and had a few moments of "shaking".  This was felt to be possible seizure-like activity which is why EMS was activated.  There is no history of recent illness, fall/injury, decreased intake or other concerns.  Discussed with patient's son [name], he advises that approximately 1 week ago patient had a decrease in her mental status normally will talk and speak a few words coherently but seem to be more lethargic over the past 7 days.  He reports that he has not been able to see the patient in several months due to COVID-19 pandemic and was informed via phone call today that they were sending patient into the ER and had noted a hematoma of the left forehead. HPI     Past Medical History:  Diagnosis Date   Alzheimer disease (Amber Fowler)    [name] 01/09/2015   Anemia    Cancer of right breast (Amber Fowler)    Depression    Glaucoma    Hyperlipidemia    Hypertension    Hypothyroid    Lumbar stenosis    Osteoarthritis    Osteoarthritis    Subdural hematoma ([facility])    Thyroid disease    Vitamin E deficiency     Patient Active Problem List   Diagnosis Date Noted   SAH (subarachnoid hemorrhage) (Amber Fowler) 07/15/2019   Subdural hematoma due to concussion (Amber Fowler) 07/15/2019   Stroke (cerebrum) (Amber Fowler) 07/15/2019   AMS (altered mental status) 07/15/2019   Palliative care encounter  12/09/2017   Closed nondisplaced fracture of coronoid process of right ulna 08/21/2016   Humerus fracture    Acute blood loss anemia    Hip fracture requiring operative repair (Amber Fowler) 01/10/2015   Hypertension 01/09/2015   Hypothyroid 01/09/2015   Alzheimer disease ([place]) 01/09/2015   Left displaced femoral neck fracture (Amber Fowler) 01/09/2015    Past Surgical History:  Procedure Laterality Date   HIP ARTHROPLASTY Left 01/10/2015   Procedure: ARTHROPLASTY BIPOLAR HIP (HEMIARTHROPLASTY);  Surgeon: [name], MD;  Location: [facility];  Service: Orthopedics;  Laterality: Left;   MASTECTOMY Right 2003   /notes 10/01/2010   ORIF HUMERUS FRACTURE Left 01/10/2015   Procedure: OPEN REDUCTION INTERNAL FIXATION (ORIF) HUMERAL SHAFT FRACTURE;  Surgeon: [name], MD;  Location: [facility];  Service: Orthopedics;  Laterality: Left;     OB History   No obstetric history on file.     History reviewed. No pertinent family history.  Social History   Tobacco Use   Smoking status: Never Smoker   Smokeless tobacco: Never Used  Substance Use Topics   Alcohol use: No   Drug use: No    Home Medications Prior to Admission medications   Medication Sig Start Date End Date Taking? Authorizing Provider  ALPRAZolam (XANAX) 0.25 MG tablet Take 0.25 mg by mouth 1 day or 1 dose. At 4pm 05/06/18   [provider]  amoxicillin (AMOXIL) 500 MG  capsule Take 2,000 mg by mouth See admin instructions. One hour prior to dental procedure(s)    [provider]  Cholecalciferol (VITAMIN D) 2000 units CAPS Take 2,000 Units by mouth at bedtime.    [provider]  docusate sodium (COLACE) 100 MG capsule Take 100 mg by mouth 2 (two) times daily.    [provider]  DULoxetine (CYMBALTA) 20 MG capsule Take 20 mg by mouth daily.    [provider]  latanoprost (XALATAN) 0.005 % ophthalmic solution Place 1 drop into both eyes at bedtime.    [provider]  levothyroxine (SYNTHROID, LEVOTHROID) 50 MCG tablet Take 50 mcg by mouth every morning. 0.5 tab (81mcg) every morning    [provider]  Lidocaine HCl (ASPERCREME LIDOCAINE) 4 % LIQD Apply topically.    [provider]  lisinopril (PRINIVIL,ZESTRIL) 20 MG tablet Take 20 mg by mouth daily.    [provider]  LORazepam (ATIVAN) 0.5 MG tablet Take 0.5 mg by mouth daily as needed for anxiety. Take one tablet by mouth once daily as needed for anxiety    [provider]  miconazole (BAZA ANTIFUNGAL) 2 % cream Apply 1 application topically 3 (three) times daily.    [provider]  mineral oil external liquid 2 drops by Does not apply route. Instill 2 drops into each ear once weekly    [provider]  polyvinyl alcohol (LIQUIFILM TEARS) 1.4 % ophthalmic solution Place 1 drop into both eyes 2 (two) times daily as needed (for irritation or blurred vision).    [provider]    Allergies    Patient has no known allergies.  Review of Systems   Review of Systems  Unable to perform ROS: Dementia    Physical Exam Updated Vital Signs BP (!) 156/54    Pulse 65    Temp 97.7 F (36.5 C) (Rectal)    Resp 11    SpO2 98%   Physical Exam Constitutional:      General: She is not in acute distress.    Appearance: Normal appearance. She is well-developed. She is not ill-appearing or diaphoretic.  HENT:     Head: Normocephalic.     Jaw: There is normal jaw occlusion. No trismus.      Comments: Hematoma    Right Ear: External ear normal.     Left Ear: External ear normal.     Nose:     Right Nostril: No epistaxis.     Left Nostril: Epistaxis (Small amount within the nares) present.  Eyes:     General: Vision grossly intact. Gaze aligned appropriately.     Pupils: Pupils are equal, round, and reactive to light.  Neck:     Trachea: Trachea and phonation normal. No tracheal deviation.  Pulmonary:     Effort: Pulmonary effort is  normal. No respiratory distress.  Chest:     Chest wall: No deformity, tenderness or crepitus.  Abdominal:     General: There is no distension.     Palpations: Abdomen is soft.     Tenderness: There is no abdominal tenderness. There is no guarding or rebound.  Musculoskeletal:        General: Normal range of motion.     Cervical back: Normal range of motion.     Comments: No midline C/T/L spinal tenderness to palpation, no deformity, crepitus, or step-off noted. No sign of injury to the neck or back. - Pelvis stable to compression bilaterally without evidence  of pain. - Range of motion of bilateral shoulders, elbows, wrists, knees, hips and ankles appropriate for age, patient resisted range of motion with appropriate strength, there is no crepitus or deformities felt or evidence of pain.  Skin:    General: Skin is warm and dry.  Neurological:     Mental Status: She is alert.     GCS: GCS eye subscore is 4. GCS verbal subscore is 5. GCS motor subscore is 6.     Comments: Lethargic, patient holds her eyes closed when attempting to evaluate the pupils, equal strength with closing eyes bilaterally.  No obvious facial droop.  Resists movement of bilateral upper extremities and lower extremities with equal strength.  Reacts to pain bilateral extremities.    ED Results / Procedures / Treatments   Labs (all labs ordered are listed, but only abnormal results are displayed) Labs Reviewed  COMPREHENSIVE METABOLIC PANEL - Abnormal; Notable for the following components:      Result Value   Glucose, Bld 106 (*)    Albumin 3.3 (*)    All other components within normal limits  DIFFERENTIAL - Abnormal; Notable for the following components:   Neutro Abs 8.0 (*)    All other components within normal limits  CBG MONITORING, ED - Abnormal; Notable for the following components:   Glucose-Capillary 111 (*)    All other components within normal limits  I-STAT CHEM 8, ED - Abnormal; Notable for the  following components:   Potassium 5.4 (*)    Glucose, Bld 124 (*)    Hemoglobin 15.3 (*)    All other components within normal limits  MAGNESIUM  PROTIME-INR  APTT  CBC  CBC WITH DIFFERENTIAL/PLATELET  URINALYSIS, ROUTINE W REFLEX MICROSCOPIC  POC SARS CORONAVIRUS 2 AG -  ED    EKG EKG Interpretation  Date/Time:  Friday July 15 2019 11:57:31 EST Ventricular Rate:  60 PR Interval:    QRS Duration: 95 QT Interval:  486 QTC Calculation: 486 R Axis:   43 Text Interpretation: Sinus rhythm Prolonged PR interval LVH with secondary repolarization abnormality Borderline prolonged QT interval No significant change since last tracing Confirmed by Blanchie Dessert [phone]) on 07/15/2019 12:29:37 PM   Radiology DG Pelvis 1-2 Views  Result Date: 07/15/2019 CLINICAL DATA:  Seizure.  Nonverbal. EXAM: PELVIS - 1-2 VIEW COMPARISON:  01/10/2015 FINDINGS: The right hip prosthesis is normally located. No complicating features. The right hip is also normally located. No right hip fracture. The pubic symphysis and SI joints are intact. No definite pelvic fractures. IMPRESSION: No acute bony findings. Electronically Signed   By: Marijo Sanes M.D.   On: 07/15/2019 12:35   CT HEAD WO CONTRAST  Result Date: 07/15/2019 CLINICAL DATA:  Seizure, nontraumatic, facial trauma. EXAM: CT HEAD WITHOUT CONTRAST CT MAXILLOFACIAL WITHOUT CONTRAST CT CERVICAL SPINE WITHOUT CONTRAST CT ABDOMEN AND PELVIS WITHOUT CONTRAST TECHNIQUE: Contiguous axial images were obtained from the base of the skull through the vertex without intravenous contrast. Multidetector CT imaging of the maxillofacial structures was performed. Multiplanar CT image reconstructions were also generated. A small metallic BB was placed on the right temple in order to reliably differentiate right from left. Multidetector CT imaging of the cervical spine was performed without intravenous contrast. Multiplanar CT image reconstructions were also  generated. COMPARISON:  CT head and cervical spine 01/09/2015 FINDINGS: CT HEAD FINDINGS Brain: Since prior head CT 01/09/2015 there has been interval increase in size of a partially calcified meningioma with both hyperdense soft tissue components and  calcified components. This now measures 2.1 x 1.6 x 2.4 cm (previously 1.5 cm). There is small volume acute subarachnoid hemorrhage along the anterior left frontal lobe, adjacent to meningioma. A small mixed density left subdural collection is also questioned overlying the anterior left frontal lobe, measuring 5 mm in thickness (series 3, image 14). Additional scattered supratentorial small volume acute subarachnoid hemorrhage, most notably along the lateral right temporal lobe (series 3, image 13). Abnormal cortical/subcortical hypodensity throughout much of the left PCA vascular territory within the left temporal and occipital lobes consistent with infarct, likely early subacute. Associated curvilinear hyperdensity at this site may reflect petechial hemorrhage or additional small volume acute subarachnoid hemorrhage. Redemonstrated small chronic focus of encephalomalacia within the anterolateral right frontal lobe. No midline shift. Ill-defined hypoattenuation within the cerebral white matter is nonspecific, but consistent with chronic small vessel ischemic disease. Moderate generalized parenchymal atrophy. Calcifications redemonstrated within the basal ganglia and cerebellar white matter. Vascular: No definite hyperdense vessel. Atherosclerotic calcifications. Skull: Normal. Negative for fracture or focal lesion. Other: Right mastoid effusion. Redemonstrated left suboccipital scalp lesion measuring 2.4 cm, likely benign CT MAXILLOFACIAL FINDINGS Osseous: Non-displaced fracture of the left nasal bone/frontal process of maxilla. No other maxillofacial fracture is identified. Orbits: No acute abnormality. Sinuses: Mild ethmoid sinus mucosal thickening. Soft tissues:  Swelling of the left frontotemporal scalp, forehead and left maxillofacial soft tissues. CT CERVICAL SPINE FINDINGS Alignment: Reversal of the expected cervical lordosis. No significant spondylolisthesis. Skull base and vertebrae: The basion-dental and atlanto-dental intervals are maintained.No evidence of acute fracture to the cervical spine. Soft tissues and spinal canal: No prevertebral fluid or swelling. No visible canal hematoma. Atherosclerotic disease. Disc levels: Congenital nonunion of the posterior arch of C1. Redemonstrated severe cervical spondylosis with multilevel disc degeneration, posterior disc osteophytes, uncovertebral and facet hypertrophy. Redemonstrated degenerative fusion across the C5-C6 disc space and of the right sided articular pillars at this level. There may also be fusion across the C6-C7 disc space. Degenerative fusion of the C7 and T1 articular pillars on the right. Upper chest: Imaged lung apices clear.  No visible pneumothorax. These results were called by telephone at the time of interpretation on 07/15/2019 at 2:03 pm to provider [facility] , who verbally acknowledged these results. IMPRESSION: CT head: 1. A partially calcified left frontal meningioma has increased in size since head CT 01/09/2015, now 2.1 cm. 2. Small volume acute subarachnoid hemorrhage along the anterior left frontal lobe, adjacent to the meningioma. Additional scattered small volume acute supratentorial subarachnoid hemorrhage, greatest along the lateral right temporal lobe. 3. A small mixed density subdural hematoma is also questioned overlying the anterior left frontal lobe, 5 mm in thickness. 4. Infarction change throughout much of the left PCA vascular territory, likely early subacute. Associated gyriform hyperdensity at this site may reflect petechial hemorrhage or additional small volume acute subarachnoid hemorrhage. 5. Chronic encephalomalacia within the anterolateral right frontal lobe. 6.  Generalized parenchymal atrophy and chronic small vessel ischemic disease. 7. Right mastoid effusion. CT maxillofacial: 1. Nondisplaced fracture of the left nasal bone/frontal process of maxilla. 2. Left frontotemporal scalp, forehead and left maxillofacial soft tissue swelling. 3. Mild ethmoid sinus mucosal thickening. CT cervical spine: 1. No evidence of acute fracture to the cervical spine. 2. Redemonstrated advanced cervical spondylosis with multiple sites of degenerative fusion, as detailed. Electronically Signed   By: Kellie Simmering DO   On: 07/15/2019 14:14   CT CERVICAL SPINE WO CONTRAST  Result Date: 07/15/2019 CLINICAL DATA:  Seizure, nontraumatic, facial  trauma. EXAM: CT HEAD WITHOUT CONTRAST CT MAXILLOFACIAL WITHOUT CONTRAST CT CERVICAL SPINE WITHOUT CONTRAST CT ABDOMEN AND PELVIS WITHOUT CONTRAST TECHNIQUE: Contiguous axial images were obtained from the base of the skull through the vertex without intravenous contrast. Multidetector CT imaging of the maxillofacial structures was performed. Multiplanar CT image reconstructions were also generated. A small metallic BB was placed on the right temple in order to reliably differentiate right from left. Multidetector CT imaging of the cervical spine was performed without intravenous contrast. Multiplanar CT image reconstructions were also generated. COMPARISON:  CT head and cervical spine 01/09/2015 FINDINGS: CT HEAD FINDINGS Brain: Since prior head CT 01/09/2015 there has been interval increase in size of a partially calcified meningioma with both hyperdense soft tissue components and calcified components. This now measures 2.1 x 1.6 x 2.4 cm (previously 1.5 cm). There is small volume acute subarachnoid hemorrhage along the anterior left frontal lobe, adjacent to meningioma. A small mixed density left subdural collection is also questioned overlying the anterior left frontal lobe, measuring 5 mm in thickness (series 3, image 14). Additional scattered  supratentorial small volume acute subarachnoid hemorrhage, most notably along the lateral right temporal lobe (series 3, image 13). Abnormal cortical/subcortical hypodensity throughout much of the left PCA vascular territory within the left temporal and occipital lobes consistent with infarct, likely early subacute. Associated curvilinear hyperdensity at this site may reflect petechial hemorrhage or additional small volume acute subarachnoid hemorrhage. Redemonstrated small chronic focus of encephalomalacia within the anterolateral right frontal lobe. No midline shift. Ill-defined hypoattenuation within the cerebral white matter is nonspecific, but consistent with chronic small vessel ischemic disease. Moderate generalized parenchymal atrophy. Calcifications redemonstrated within the basal ganglia and cerebellar white matter. Vascular: No definite hyperdense vessel. Atherosclerotic calcifications. Skull: Normal. Negative for fracture or focal lesion. Other: Right mastoid effusion. Redemonstrated left suboccipital scalp lesion measuring 2.4 cm, likely benign CT MAXILLOFACIAL FINDINGS Osseous: Non-displaced fracture of the left nasal bone/frontal process of maxilla. No other maxillofacial fracture is identified. Orbits: No acute abnormality. Sinuses: Mild ethmoid sinus mucosal thickening. Soft tissues: Swelling of the left frontotemporal scalp, forehead and left maxillofacial soft tissues. CT CERVICAL SPINE FINDINGS Alignment: Reversal of the expected cervical lordosis. No significant spondylolisthesis. Skull base and vertebrae: The basion-dental and atlanto-dental intervals are maintained.No evidence of acute fracture to the cervical spine. Soft tissues and spinal canal: No prevertebral fluid or swelling. No visible canal hematoma. Atherosclerotic disease. Disc levels: Congenital nonunion of the posterior arch of C1. Redemonstrated severe cervical spondylosis with multilevel disc degeneration, posterior disc  osteophytes, uncovertebral and facet hypertrophy. Redemonstrated degenerative fusion across the C5-C6 disc space and of the right sided articular pillars at this level. There may also be fusion across the C6-C7 disc space. Degenerative fusion of the C7 and T1 articular pillars on the right. Upper chest: Imaged lung apices clear.  No visible pneumothorax. These results were called by telephone at the time of interpretation on 07/15/2019 at 2:03 pm to provider [facility] , who verbally acknowledged these results. IMPRESSION: CT head: 1. A partially calcified left frontal meningioma has increased in size since head CT 01/09/2015, now 2.1 cm. 2. Small volume acute subarachnoid hemorrhage along the anterior left frontal lobe, adjacent to the meningioma. Additional scattered small volume acute supratentorial subarachnoid hemorrhage, greatest along the lateral right temporal lobe. 3. A small mixed density subdural hematoma is also questioned overlying the anterior left frontal lobe, 5 mm in thickness. 4. Infarction change throughout much of the left PCA vascular territory, likely  early subacute. Associated gyriform hyperdensity at this site may reflect petechial hemorrhage or additional small volume acute subarachnoid hemorrhage. 5. Chronic encephalomalacia within the anterolateral right frontal lobe. 6. Generalized parenchymal atrophy and chronic small vessel ischemic disease. 7. Right mastoid effusion. CT maxillofacial: 1. Nondisplaced fracture of the left nasal bone/frontal process of maxilla. 2. Left frontotemporal scalp, forehead and left maxillofacial soft tissue swelling. 3. Mild ethmoid sinus mucosal thickening. CT cervical spine: 1. No evidence of acute fracture to the cervical spine. 2. Redemonstrated advanced cervical spondylosis with multiple sites of degenerative fusion, as detailed. Electronically Signed   By: Kellie Simmering DO   On: 07/15/2019 14:14   DG Chest Port 1 View  Result Date:  07/15/2019 CLINICAL DATA:  Seizure EXAM: PORTABLE CHEST 1 VIEW COMPARISON:  03/15/2017 FINDINGS: Heart size appears within normal limits. Calcified thoracic aorta. Lung volumes are low. No focal airspace consolidation, pleural effusion, or pneumothorax. Osseous structures appear diffusely demineralized. Partially visualized ORIF hardware of the proximal left humerus. Severe degenerative changes of the shoulders. Linear lucency through the posterosuperior aspect of the right scapula suspicious for fracture. IMPRESSION: 1. Linear lucency through the posterosuperior aspect of the right scapula suspicious for fracture. 2. No acute cardiopulmonary findings. Electronically Signed   By: Davina Poke M.D.   On: 07/15/2019 12:35   CT Maxillofacial Wo Contrast  Result Date: 07/15/2019 CLINICAL DATA:  Seizure, nontraumatic, facial trauma. EXAM: CT HEAD WITHOUT CONTRAST CT MAXILLOFACIAL WITHOUT CONTRAST CT CERVICAL SPINE WITHOUT CONTRAST CT ABDOMEN AND PELVIS WITHOUT CONTRAST TECHNIQUE: Contiguous axial images were obtained from the base of the skull through the vertex without intravenous contrast. Multidetector CT imaging of the maxillofacial structures was performed. Multiplanar CT image reconstructions were also generated. A small metallic BB was placed on the right temple in order to reliably differentiate right from left. Multidetector CT imaging of the cervical spine was performed without intravenous contrast. Multiplanar CT image reconstructions were also generated. COMPARISON:  CT head and cervical spine 01/09/2015 FINDINGS: CT HEAD FINDINGS Brain: Since prior head CT 01/09/2015 there has been interval increase in size of a partially calcified meningioma with both hyperdense soft tissue components and calcified components. This now measures 2.1 x 1.6 x 2.4 cm (previously 1.5 cm). There is small volume acute subarachnoid hemorrhage along the anterior left frontal lobe, adjacent to meningioma. A small mixed  density left subdural collection is also questioned overlying the anterior left frontal lobe, measuring 5 mm in thickness (series 3, image 14). Additional scattered supratentorial small volume acute subarachnoid hemorrhage, most notably along the lateral right temporal lobe (series 3, image 13). Abnormal cortical/subcortical hypodensity throughout much of the left PCA vascular territory within the left temporal and occipital lobes consistent with infarct, likely early subacute. Associated curvilinear hyperdensity at this site may reflect petechial hemorrhage or additional small volume acute subarachnoid hemorrhage. Redemonstrated small chronic focus of encephalomalacia within the anterolateral right frontal lobe. No midline shift. Ill-defined hypoattenuation within the cerebral white matter is nonspecific, but consistent with chronic small vessel ischemic disease. Moderate generalized parenchymal atrophy. Calcifications redemonstrated within the basal ganglia and cerebellar white matter. Vascular: No definite hyperdense vessel. Atherosclerotic calcifications. Skull: Normal. Negative for fracture or focal lesion. Other: Right mastoid effusion. Redemonstrated left suboccipital scalp lesion measuring 2.4 cm, likely benign CT MAXILLOFACIAL FINDINGS Osseous: Non-displaced fracture of the left nasal bone/frontal process of maxilla. No other maxillofacial fracture is identified. Orbits: No acute abnormality. Sinuses: Mild ethmoid sinus mucosal thickening. Soft tissues: Swelling of the left frontotemporal scalp,  forehead and left maxillofacial soft tissues. CT CERVICAL SPINE FINDINGS Alignment: Reversal of the expected cervical lordosis. No significant spondylolisthesis. Skull base and vertebrae: The basion-dental and atlanto-dental intervals are maintained.No evidence of acute fracture to the cervical spine. Soft tissues and spinal canal: No prevertebral fluid or swelling. No visible canal hematoma. Atherosclerotic  disease. Disc levels: Congenital nonunion of the posterior arch of C1. Redemonstrated severe cervical spondylosis with multilevel disc degeneration, posterior disc osteophytes, uncovertebral and facet hypertrophy. Redemonstrated degenerative fusion across the C5-C6 disc space and of the right sided articular pillars at this level. There may also be fusion across the C6-C7 disc space. Degenerative fusion of the C7 and T1 articular pillars on the right. Upper chest: Imaged lung apices clear.  No visible pneumothorax. These results were called by telephone at the time of interpretation on 07/15/2019 at 2:03 pm to provider [facility] , who verbally acknowledged these results. IMPRESSION: CT head: 1. A partially calcified left frontal meningioma has increased in size since head CT 01/09/2015, now 2.1 cm. 2. Small volume acute subarachnoid hemorrhage along the anterior left frontal lobe, adjacent to the meningioma. Additional scattered small volume acute supratentorial subarachnoid hemorrhage, greatest along the lateral right temporal lobe. 3. A small mixed density subdural hematoma is also questioned overlying the anterior left frontal lobe, 5 mm in thickness. 4. Infarction change throughout much of the left PCA vascular territory, likely early subacute. Associated gyriform hyperdensity at this site may reflect petechial hemorrhage or additional small volume acute subarachnoid hemorrhage. 5. Chronic encephalomalacia within the anterolateral right frontal lobe. 6. Generalized parenchymal atrophy and chronic small vessel ischemic disease. 7. Right mastoid effusion. CT maxillofacial: 1. Nondisplaced fracture of the left nasal bone/frontal process of maxilla. 2. Left frontotemporal scalp, forehead and left maxillofacial soft tissue swelling. 3. Mild ethmoid sinus mucosal thickening. CT cervical spine: 1. No evidence of acute fracture to the cervical spine. 2. Redemonstrated advanced cervical spondylosis with multiple  sites of degenerative fusion, as detailed. Electronically Signed   By: Kellie Simmering DO   On: 07/15/2019 14:14    Procedures .Critical Care Performed by: Deliah Boston, PA-C Authorized by: Deliah Boston, PA-C   Critical care provider statement:    Critical care time (minutes):  40   Critical care was necessary to treat or prevent imminent or life-threatening deterioration of the following conditions:  CNS failure or compromise (Subarachnoid hemorrhage, subacute CVA)   Critical care was time spent personally by me on the following activities:  Discussions with consultants, evaluation of patient's response to treatment, examination of patient, ordering and performing treatments and interventions, ordering and review of laboratory studies, ordering and review of radiographic studies, pulse oximetry, re-evaluation of patient's condition, obtaining history from patient or surrogate, review of old charts and development of treatment plan with patient or surrogate   (including critical care time)  Medications Ordered in ED Medications  NIFEdipine (PROCARDIA) capsule 10 mg (has no administration in time range)  levothyroxine (SYNTHROID) tablet 50 mcg (has no administration in time range)  LORazepam (ATIVAN) tablet 0.5 mg (has no administration in time range)  DULoxetine (CYMBALTA) DR capsule 20 mg (has no administration in time range)  ALPRAZolam (XANAX) tablet 0.25 mg (has no administration in time range)  lisinopril (ZESTRIL) tablet 20 mg (has no administration in time range)  docusate sodium (COLACE) capsule 100 mg (has no administration in time range)  latanoprost (XALATAN) 0.005 % ophthalmic solution 1 drop (has no administration in time range)  polyvinyl alcohol (  LIQUIFILM TEARS) 1.4 % ophthalmic solution 1 drop (has no administration in time range)   stroke: mapping our early stages of recovery book (has no administration in time range)  0.9 %  sodium chloride infusion (has no  administration in time range)  acetaminophen (TYLENOL) tablet 650 mg (has no administration in time range)    Or  acetaminophen (TYLENOL) 160 MG/5ML solution 650 mg (has no administration in time range)    Or  acetaminophen (TYLENOL) suppository 650 mg (has no administration in time range)  senna-docusate (Senokot-S) tablet 1 tablet (has no administration in time range)    ED Course  I have reviewed the triage vital signs and the nursing notes.  Pertinent labs & imaging results that were available during my care of the patient were reviewed by me and considered in my medical decision making (see chart for details).  Clinical Course as of Jul 14 1521  Fri Jul 15, 2019  1220 [name] (son):    [BM]  1224 Dottie (care coordination):   [BM]  21 Dr. Lorraine Lax   [BM]  1429 [name]   [BM]  1432 Dr. Trenton Gammon   [BM]  1444 Dr. Roosevelt Locks   [BM]    Clinical Course User Index [BM] Deliah Boston, PA-C   MDM Rules/Calculators/A&P                     CBG 111 I-STAT potassium 5.4, nonacute CBC nonacute CMP nonacute (potassium 4.3) PT/INR within normal limits APTT within normal limits Magnesium within normal limits Rapid Covid negative CXR:  IMPRESSION:  1. Linear lucency through the posterosuperior aspect of the right  scapula suspicious for fracture.  2. No acute cardiopulmonary findings.   DG Pelvis:  IMPRESSION:  No acute bony findings.   CT Head/C-Spine/Maxface:  IMPRESSION:  CT head:    1. A partially calcified left frontal meningioma has increased in  size since head CT 01/09/2015, now 2.1 cm.  2. Small volume acute subarachnoid hemorrhage along the anterior  left frontal lobe, adjacent to the meningioma. Additional scattered  small volume acute supratentorial subarachnoid hemorrhage, greatest  along the lateral right temporal lobe.  3. A small mixed density subdural hematoma is also questioned  overlying the anterior left frontal lobe, 5 mm in thickness.  4.  Infarction change throughout much of the left PCA vascular  territory, likely early subacute. Associated gyriform hyperdensity  at this site may reflect petechial hemorrhage or additional small  volume acute subarachnoid hemorrhage.  5. Chronic encephalomalacia within the anterolateral right frontal  lobe.  6. Generalized parenchymal atrophy and chronic small vessel ischemic  disease.  7. Right mastoid effusion.    CT maxillofacial:    1. Nondisplaced fracture of the left nasal bone/frontal process of  maxilla.  2. Left frontotemporal scalp, forehead and left maxillofacial soft  tissue swelling.  3. Mild ethmoid sinus mucosal thickening.    CT cervical spine:    1. No evidence of acute fracture to the cervical spine.  2. Redemonstrated advanced cervical spondylosis with multiple sites  of degenerative fusion, as detailed.   - After discussion with family member, nursing home and specialist, there is concern that patient may have had stroke causing her mental status change 7 days ago.  I discussed with Dr. Lorraine Lax with neurology who will be seeing patient.  Unfortunately it appears patient suffered a fall today striking the left side of her face causing a nasal bone fracture, hematoma and traumatic subarachnoid hemorrhage.  Neurosurgery was consulted, Dr. Trenton Gammon who advised from a neurosurgical standpoint there is no intervention and to admit to hospitalist service.  I then discussed case with Dr. Roosevelt Locks from hospitalist service who will be seeing patient for admission.  I called patient's son [name] to update him on findings and plan of care he states understanding and has no further questions at this point.  On reassessment patient protecting her airway vital signs stable no acute distress.  Additionally suspect possible left scapular fracture may have been from a fall she has no sign of significant thoracic or abdominal injury indication further imaging at this point.  Patient was  seen and evaluated by Dr. Maryan Rued during this visit.  Note: Portions of this report may have been transcribed using voice recognition software. Every effort was made to ensure accuracy; however, inadvertent computerized transcription errors may still be present. Final Clinical Impression(s) / ED Diagnoses Final diagnoses:  Subarachnoid hemorrhage ([place])  Cerebrovascular accident (CVA), unspecified mechanism ([place])  Closed fracture of right scapula, unspecified part of scapula, initial encounter  Closed fracture of nasal bone, initial encounter    Rx / DC Orders ED Discharge Orders    None       Gari Crown 07/15/19 1526    Blanchie Dessert, MD 07/16/19 2340

## 2019-07-15 NOTE — ED Notes (Signed)
ED TO INPATIENT HANDOFF REPORT  ED Nurse Name and Phone #: [phone]  S Name/Age/Gender Amber Fowler 85 y.o. female Room/Bed: 021C/021C  Code Status   Code Status: DNR  Home/SNF/Other Home Patient oriented to: disoriented x4 Is this baseline? Yes   Triage Complete: Triage complete  Chief Complaint AMS (altered mental status) [R41.82]  Triage Note Pt came from home where she gets 24 hr nursing care with home health. Her in-home caretaker noted pt  to have some blood coming from her Lft nostril & reported it to be "not much & was not "pouring" & that pt then began to "shake." EMS reports that the explanation sounded to them as if it was seizure-like activity. EMS also reported that pt has a hematoma to her Lft-forehead that is unknown how it was obtained (per care taker). Pt has a hx of dementia & is at her baseline at this moment, nonverbal, resistant to passive ROM in her arms, on RA sating at 99% upon arrival.    Allergies No Known Allergies  Level of Care/Admitting Diagnosis ED Disposition    ED Disposition Condition [place]: [place] [[id number]]  Level of Care: Telemetry Medical [104]  Covid Evaluation: Asymptomatic Screening Protocol (No Symptoms)  Diagnosis: AMS (altered mental status) [[id number]]  Admitting Physician: Lequita Halt [[id number]]  Attending Physician: Lequita Halt [[id number]]  Estimated length of stay: 3 - 4 days  Certification:: I certify this patient will need inpatient services for at least 2 midnights       B Medical/Surgery History Past Medical History:  Diagnosis Date  . Alzheimer disease ([place])    [name] 01/09/2015  . Anemia   . Cancer of right breast ([place])   . Depression   . Glaucoma   . Hyperlipidemia   . Hypertension   . Hypothyroid   . Lumbar stenosis   . Osteoarthritis   . Osteoarthritis   . Subdural hematoma ([place])   . Thyroid disease   . Vitamin E deficiency    Past Surgical  History:  Procedure Laterality Date  . HIP ARTHROPLASTY Left 01/10/2015   Procedure: ARTHROPLASTY BIPOLAR HIP (HEMIARTHROPLASTY);  Surgeon: [name], MD;  Location: [facility];  Service: Orthopedics;  Laterality: Left;  [name] MASTECTOMY Right 2003   [name] 10/01/2010  . ORIF HUMERUS FRACTURE Left 01/10/2015   Procedure: OPEN REDUCTION INTERNAL FIXATION (ORIF) HUMERAL SHAFT FRACTURE;  Surgeon: [name], MD;  Location: [facility];  Service: Orthopedics;  Laterality: Left;     A IV Location/Drains/Wounds Patient Lines/Drains/Airways Status   Active Line/Drains/Airways    Name:   Placement date:   Placement time:   Site:   Days:   Peripheral IV 07/15/19 Right Hand   07/15/19    1700    Hand   less than 1   Urethral Catheter Claretha Cooper, EMT Latex   01/09/15    1635    Latex   1648   Incision (Closed) 01/10/15 Arm Left   01/10/15    1949     1647   Incision (Closed) 01/10/15 Hip Left   01/10/15    2056     1647          Intake/Output Last 24 hours No intake or output data in the 24 hours ending 07/15/19 2026  Labs/Imaging Results for orders placed or performed during the hospital encounter of 07/15/19 (from the past 48 hour(s))  CBG monitoring, ED  Status: Abnormal   Collection Time: 07/15/19 12:30 PM  Result Value Ref Range   Glucose-Capillary 111 (H) 70 - 99 mg/dL  I-Stat Chem 8, ED     Status: Abnormal   Collection Time: 07/15/19 12:50 PM  Result Value Ref Range   Sodium 143 135 - 145 mmol/L   Potassium 5.4 (H) 3.5 - 5.1 mmol/L   Chloride 107 98 - 111 mmol/L   BUN 21 8 - 23 mg/dL   Creatinine, Ser 0.70 0.44 - 1.00 mg/dL   Glucose, Bld 124 (H) 70 - 99 mg/dL   Calcium, Ion 1.20 1.15 - 1.40 mmol/L   TCO2 32 22 - 32 mmol/L   Hemoglobin 15.3 (H) 12.0 - 15.0 g/dL   HCT 45.0 36.0 - 46.0 %  Magnesium     Status: None   Collection Time: 07/15/19  1:35 PM  Result Value Ref Range   Magnesium 2.0 1.7 - 2.4 mg/dL    Comment: Performed at [facility] [address], [place], [place] [address]  Comprehensive metabolic panel     Status: Abnormal   Collection Time: 07/15/19  1:35 PM  Result Value Ref Range   Sodium 142 135 - 145 mmol/L   Potassium 4.3 3.5 - 5.1 mmol/L   Chloride 108 98 - 111 mmol/L   CO2 24 22 - 32 mmol/L   Glucose, Bld 106 (H) 70 - 99 mg/dL   BUN 12 8 - 23 mg/dL   Creatinine, Ser 0.68 0.44 - 1.00 mg/dL   Calcium 9.2 8.9 - 10.3 mg/dL   Total Protein 7.2 6.5 - 8.1 g/dL   Albumin 3.3 (L) 3.5 - 5.0 g/dL   AST 30 15 - 41 U/L   ALT 10 0 - 44 U/L   Alkaline Phosphatase 51 38 - 126 U/L   Total Bilirubin 0.8 0.3 - 1.2 mg/dL   GFR calc non Af Amer >60 >60 mL/min   GFR calc Af Amer >60 >60 mL/min   Anion gap 10 5 - 15    Comment: Performed at [facility] [address], [place], [place] [address]  Protime-INR     Status: None   Collection Time: 07/15/19  1:35 PM  Result Value Ref Range   Prothrombin Time 13.5 11.4 - 15.2 seconds   INR 1.0 0.8 - 1.2    Comment: (NOTE) INR goal varies based on device and disease states. Performed at [facility], [place] [address], [place], [place] [address]   APTT     Status: None   Collection Time: 07/15/19  1:35 PM  Result Value Ref Range   aPTT 31 24 - 36 seconds    Comment: Performed at [facility] [address] [place], [place] [address]  CBC     Status: None   Collection Time: 07/15/19  1:35 PM  Result Value Ref Range   WBC 10.2 4.0 - 10.5 K/uL   RBC 4.52 3.87 - 5.11 MIL/uL   Hemoglobin 13.8 12.0 - 15.0 g/dL   HCT 43.5 36.0 - 46.0 %   MCV 96.2 80.0 - 100.0 fL   MCH 30.5 26.0 - 34.0 pg   MCHC 31.7 30.0 - 36.0 g/dL   RDW 13.7 11.5 - 15.5 %   Platelets 206 150 - 400 K/uL   nRBC 0.0 0.0 - 0.2 %    Comment: Performed at [facility], [place] [address] [place], [place] [address]  Differential  Status: Abnormal   Collection Time: 07/15/19  1:35 PM  Result Value Ref Range   Neutrophils Relative % 80 %   Neutro Abs 8.0 (H) 1.7 - 7.7 K/uL   Lymphocytes Relative 10  %   Lymphs Abs 1.0 0.7 - 4.0 K/uL   Monocytes Relative 10 %   Monocytes Absolute 1.0 0.1 - 1.0 K/uL   Eosinophils Relative 0 %   Eosinophils Absolute 0.0 0.0 - 0.5 K/uL   Basophils Relative 0 %   Basophils Absolute 0.0 0.0 - 0.1 K/uL   Immature Granulocytes 0 %   Abs Immature Granulocytes 0.04 0.00 - 0.07 K/uL    Comment: Performed at [facility] [address] [place], [place] [address]  POC SARS Coronavirus 2 Ag-ED - Nasal Swab (BD Veritor Kit)     Status: None   Collection Time: 07/15/19  2:16 PM  Result Value Ref Range   SARS Coronavirus 2 Ag NEGATIVE NEGATIVE    Comment: (NOTE) SARS-CoV-2 antigen NOT DETECTED.  Negative results are presumptive.  Negative results do not preclude SARS-CoV-2 infection and should not be used as the sole basis for treatment or other patient management decisions, including infection  control decisions, particularly in the presence of clinical signs and  symptoms consistent with COVID-19, or in those who have been in contact with the virus.  Negative results must be combined with clinical observations, patient history, and epidemiological information. The expected result is Negative. Fact Sheet for Patients: PodPark.tn Fact Sheet for Healthcare Providers: GiftContent.is This test is not yet approved or cleared by the [place] FDA and  has been authorized for detection and/or diagnosis of SARS-CoV-2 by FDA under an Emergency Use Authorization (EUA).  This EUA will remain in effect (meaning this test can be used) for the duration of  the COVID-19 de claration under Section 564(b)(1) of the Act, 21 U.S.C. section 360bbb-3(b)(1), unless the authorization is terminated or revoked sooner.    DG Pelvis 1-2 Views  Result Date: 07/15/2019 CLINICAL DATA:  Seizure.  Nonverbal. EXAM: PELVIS - 1-2 VIEW COMPARISON:  01/10/2015 FINDINGS: The right hip prosthesis is normally located. No  complicating features. The right hip is also normally located. No right hip fracture. The pubic symphysis and SI joints are intact. No definite pelvic fractures. IMPRESSION: No acute bony findings. Electronically Signed   By: Marijo Sanes M.D.   On: 07/15/2019 12:35   CT HEAD WO CONTRAST  Result Date: 07/15/2019 CLINICAL DATA:  Seizure, nontraumatic, facial trauma. EXAM: CT HEAD WITHOUT CONTRAST CT MAXILLOFACIAL WITHOUT CONTRAST CT CERVICAL SPINE WITHOUT CONTRAST CT ABDOMEN AND PELVIS WITHOUT CONTRAST TECHNIQUE: Contiguous axial images were obtained from the base of the skull through the vertex without intravenous contrast. Multidetector CT imaging of the maxillofacial structures was performed. Multiplanar CT image reconstructions were also generated. A small metallic BB was placed on the right temple in order to reliably differentiate right from left. Multidetector CT imaging of the cervical spine was performed without intravenous contrast. Multiplanar CT image reconstructions were also generated. COMPARISON:  CT head and cervical spine 01/09/2015 FINDINGS: CT HEAD FINDINGS Brain: Since prior head CT 01/09/2015 there has been interval increase in size of a partially calcified meningioma with both hyperdense soft tissue components and calcified components. This now measures 2.1 x 1.6 x 2.4 cm (previously 1.5 cm). There is small volume acute subarachnoid hemorrhage along the anterior left frontal lobe, adjacent to meningioma. A small mixed density left subdural collection is also questioned overlying the  anterior left frontal lobe, measuring 5 mm in thickness (series 3, image 14). Additional scattered supratentorial small volume acute subarachnoid hemorrhage, most notably along the lateral right temporal lobe (series 3, image 13). Abnormal cortical/subcortical hypodensity throughout much of the left PCA vascular territory within the left temporal and occipital lobes consistent with infarct, likely early  subacute. Associated curvilinear hyperdensity at this site may reflect petechial hemorrhage or additional small volume acute subarachnoid hemorrhage. Redemonstrated small chronic focus of encephalomalacia within the anterolateral right frontal lobe. No midline shift. Ill-defined hypoattenuation within the cerebral white matter is nonspecific, but consistent with chronic small vessel ischemic disease. Moderate generalized parenchymal atrophy. Calcifications redemonstrated within the basal ganglia and cerebellar white matter. Vascular: No definite hyperdense vessel. Atherosclerotic calcifications. Skull: Normal. Negative for fracture or focal lesion. Other: Right mastoid effusion. Redemonstrated left suboccipital scalp lesion measuring 2.4 cm, likely benign CT MAXILLOFACIAL FINDINGS Osseous: Non-displaced fracture of the left nasal bone/frontal process of maxilla. No other maxillofacial fracture is identified. Orbits: No acute abnormality. Sinuses: Mild ethmoid sinus mucosal thickening. Soft tissues: Swelling of the left frontotemporal scalp, forehead and left maxillofacial soft tissues. CT CERVICAL SPINE FINDINGS Alignment: Reversal of the expected cervical lordosis. No significant spondylolisthesis. Skull base and vertebrae: The basion-dental and atlanto-dental intervals are maintained.No evidence of acute fracture to the cervical spine. Soft tissues and spinal canal: No prevertebral fluid or swelling. No visible canal hematoma. Atherosclerotic disease. Disc levels: Congenital nonunion of the posterior arch of C1. Redemonstrated severe cervical spondylosis with multilevel disc degeneration, posterior disc osteophytes, uncovertebral and facet hypertrophy. Redemonstrated degenerative fusion across the C5-C6 disc space and of the right sided articular pillars at this level. There may also be fusion across the C6-C7 disc space. Degenerative fusion of the C7 and T1 articular pillars on the right. Upper chest: Imaged lung  apices clear.  No visible pneumothorax. These results were called by telephone at the time of interpretation on 07/15/2019 at 2:03 pm to provider [facility] , who verbally acknowledged these results. IMPRESSION: CT head: 1. A partially calcified left frontal meningioma has increased in size since head CT 01/09/2015, now 2.1 cm. 2. Small volume acute subarachnoid hemorrhage along the anterior left frontal lobe, adjacent to the meningioma. Additional scattered small volume acute supratentorial subarachnoid hemorrhage, greatest along the lateral right temporal lobe. 3. A small mixed density subdural hematoma is also questioned overlying the anterior left frontal lobe, 5 mm in thickness. 4. Infarction change throughout much of the left PCA vascular territory, likely early subacute. Associated gyriform hyperdensity at this site may reflect petechial hemorrhage or additional small volume acute subarachnoid hemorrhage. 5. Chronic encephalomalacia within the anterolateral right frontal lobe. 6. Generalized parenchymal atrophy and chronic small vessel ischemic disease. 7. Right mastoid effusion. CT maxillofacial: 1. Nondisplaced fracture of the left nasal bone/frontal process of maxilla. 2. Left frontotemporal scalp, forehead and left maxillofacial soft tissue swelling. 3. Mild ethmoid sinus mucosal thickening. CT cervical spine: 1. No evidence of acute fracture to the cervical spine. 2. Redemonstrated advanced cervical spondylosis with multiple sites of degenerative fusion, as detailed. Electronically Signed   By: Kellie Simmering DO   On: 07/15/2019 14:14   CT CERVICAL SPINE WO CONTRAST  Result Date: 07/15/2019 CLINICAL DATA:  Seizure, nontraumatic, facial trauma. EXAM: CT HEAD WITHOUT CONTRAST CT MAXILLOFACIAL WITHOUT CONTRAST CT CERVICAL SPINE WITHOUT CONTRAST CT ABDOMEN AND PELVIS WITHOUT CONTRAST TECHNIQUE: Contiguous axial images were obtained from the base of the skull through the vertex without intravenous  contrast. Multidetector CT  imaging of the maxillofacial structures was performed. Multiplanar CT image reconstructions were also generated. A small metallic BB was placed on the right temple in order to reliably differentiate right from left. Multidetector CT imaging of the cervical spine was performed without intravenous contrast. Multiplanar CT image reconstructions were also generated. COMPARISON:  CT head and cervical spine 01/09/2015 FINDINGS: CT HEAD FINDINGS Brain: Since prior head CT 01/09/2015 there has been interval increase in size of a partially calcified meningioma with both hyperdense soft tissue components and calcified components. This now measures 2.1 x 1.6 x 2.4 cm (previously 1.5 cm). There is small volume acute subarachnoid hemorrhage along the anterior left frontal lobe, adjacent to meningioma. A small mixed density left subdural collection is also questioned overlying the anterior left frontal lobe, measuring 5 mm in thickness (series 3, image 14). Additional scattered supratentorial small volume acute subarachnoid hemorrhage, most notably along the lateral right temporal lobe (series 3, image 13). Abnormal cortical/subcortical hypodensity throughout much of the left PCA vascular territory within the left temporal and occipital lobes consistent with infarct, likely early subacute. Associated curvilinear hyperdensity at this site may reflect petechial hemorrhage or additional small volume acute subarachnoid hemorrhage. Redemonstrated small chronic focus of encephalomalacia within the anterolateral right frontal lobe. No midline shift. Ill-defined hypoattenuation within the cerebral white matter is nonspecific, but consistent with chronic small vessel ischemic disease. Moderate generalized parenchymal atrophy. Calcifications redemonstrated within the basal ganglia and cerebellar white matter. Vascular: No definite hyperdense vessel. Atherosclerotic calcifications. Skull: Normal. Negative for  fracture or focal lesion. Other: Right mastoid effusion. Redemonstrated left suboccipital scalp lesion measuring 2.4 cm, likely benign CT MAXILLOFACIAL FINDINGS Osseous: Non-displaced fracture of the left nasal bone/frontal process of maxilla. No other maxillofacial fracture is identified. Orbits: No acute abnormality. Sinuses: Mild ethmoid sinus mucosal thickening. Soft tissues: Swelling of the left frontotemporal scalp, forehead and left maxillofacial soft tissues. CT CERVICAL SPINE FINDINGS Alignment: Reversal of the expected cervical lordosis. No significant spondylolisthesis. Skull base and vertebrae: The basion-dental and atlanto-dental intervals are maintained.No evidence of acute fracture to the cervical spine. Soft tissues and spinal canal: No prevertebral fluid or swelling. No visible canal hematoma. Atherosclerotic disease. Disc levels: Congenital nonunion of the posterior arch of C1. Redemonstrated severe cervical spondylosis with multilevel disc degeneration, posterior disc osteophytes, uncovertebral and facet hypertrophy. Redemonstrated degenerative fusion across the C5-C6 disc space and of the right sided articular pillars at this level. There may also be fusion across the C6-C7 disc space. Degenerative fusion of the C7 and T1 articular pillars on the right. Upper chest: Imaged lung apices clear.  No visible pneumothorax. These results were called by telephone at the time of interpretation on 07/15/2019 at 2:03 pm to provider [facility] , who verbally acknowledged these results. IMPRESSION: CT head: 1. A partially calcified left frontal meningioma has increased in size since head CT 01/09/2015, now 2.1 cm. 2. Small volume acute subarachnoid hemorrhage along the anterior left frontal lobe, adjacent to the meningioma. Additional scattered small volume acute supratentorial subarachnoid hemorrhage, greatest along the lateral right temporal lobe. 3. A small mixed density subdural hematoma is also  questioned overlying the anterior left frontal lobe, 5 mm in thickness. 4. Infarction change throughout much of the left PCA vascular territory, likely early subacute. Associated gyriform hyperdensity at this site may reflect petechial hemorrhage or additional small volume acute subarachnoid hemorrhage. 5. Chronic encephalomalacia within the anterolateral right frontal lobe. 6. Generalized parenchymal atrophy and chronic small vessel ischemic disease. 7. Right mastoid  effusion. CT maxillofacial: 1. Nondisplaced fracture of the left nasal bone/frontal process of maxilla. 2. Left frontotemporal scalp, forehead and left maxillofacial soft tissue swelling. 3. Mild ethmoid sinus mucosal thickening. CT cervical spine: 1. No evidence of acute fracture to the cervical spine. 2. Redemonstrated advanced cervical spondylosis with multiple sites of degenerative fusion, as detailed. Electronically Signed   By: Kellie Simmering DO   On: 07/15/2019 14:14   DG Chest Port 1 View  Result Date: 07/15/2019 CLINICAL DATA:  Seizure EXAM: PORTABLE CHEST 1 VIEW COMPARISON:  03/15/2017 FINDINGS: Heart size appears within normal limits. Calcified thoracic aorta. Lung volumes are low. No focal airspace consolidation, pleural effusion, or pneumothorax. Osseous structures appear diffusely demineralized. Partially visualized ORIF hardware of the proximal left humerus. Severe degenerative changes of the shoulders. Linear lucency through the posterosuperior aspect of the right scapula suspicious for fracture. IMPRESSION: 1. Linear lucency through the posterosuperior aspect of the right scapula suspicious for fracture. 2. No acute cardiopulmonary findings. Electronically Signed   By: Davina Poke M.D.   On: 07/15/2019 12:35   ECHOCARDIOGRAM COMPLETE  Result Date: 07/15/2019   ECHOCARDIOGRAM REPORT   Patient Name:   Amber Fowler Date of Exam: 07/15/2019 Medical Rec #:  [id number]        Height:       66.0 in Accession #:    [id number]        Weight:       118.0 lb Date of Birth:  [birth year]       BSA:          1.60 m Patient Age:    43 years         BP:           156/54 mmHg Patient Gender: F                HR:           71 bpm. Exam Location:  Inpatient Procedure: 2D Echo, Color Doppler and Cardiac Doppler Indications:    Stroke  History:        Patient has no prior history of Echocardiogram examinations.                 Stroke; Risk Factors:Hypertension and Dyslipidemia.  Sonographer:    Raquel Sarna Senior RDCS Referring Phys: [id number] [place]  1. Left ventricular ejection fraction, by visual estimation, is 65 to 70%. The left ventricle has normal function. Left ventricular septal wall thickness was severely increased. Severely increased left ventricular posterior wall thickness. There is no left ventricular hypertrophy.  2. Left ventricular diastolic parameters are consistent with Grade I diastolic dysfunction (impaired relaxation).  3. The left ventricle has no regional wall motion abnormalities.  4. Global right ventricle has normal systolic function.The right ventricular size is normal. No increase in right ventricular wall thickness.  5. Left atrial size was normal.  6. Right atrial size was normal.  7. Mild thickening of the anterior and posterior mitral valve leaflet(s).  8. Moderate mitral annular calcification.  9. The mitral valve is normal in structure. No evidence of mitral valve regurgitation. No evidence of mitral stenosis. 10. The tricuspid valve is normal in structure. Tricuspid valve regurgitation is not demonstrated. 11. The aortic valve is tricuspid. Aortic valve regurgitation is not visualized. No evidence of aortic valve sclerosis or stenosis. 12. The pulmonic valve was normal in structure. Pulmonic valve regurgitation is not visualized. 13. TR signal is inadequate for assessing pulmonary artery systolic  pressure. 14. The inferior vena cava is normal in size with greater than 50% respiratory variability, suggesting  right atrial pressure of 3 mmHg. FINDINGS  Left Ventricle: Left ventricular ejection fraction, by visual estimation, is 65 to 70%. The left ventricle has normal function. The left ventricle has no regional wall motion abnormalities. Severely increased left ventricular posterior wall thickness. There is no left ventricular hypertrophy. Left ventricular diastolic parameters are consistent with Grade I diastolic dysfunction (impaired relaxation). Right Ventricle: The right ventricular size is normal. No increase in right ventricular wall thickness. Global RV systolic function is has normal systolic function. Left Atrium: Left atrial size was normal in size. Right Atrium: Right atrial size was normal in size Pericardium: There is no evidence of pericardial effusion. Mitral Valve: The mitral valve is normal in structure. There is mild thickening of the anterior and posterior mitral valve leaflet(s). Moderate mitral annular calcification. No evidence of mitral valve regurgitation. No evidence of mitral valve stenosis by observation. Tricuspid Valve: The tricuspid valve is normal in structure. Tricuspid valve regurgitation is not demonstrated. Aortic Valve: The aortic valve is tricuspid. . There is moderate thickening of the aortic valve. Aortic valve regurgitation is not visualized. The aortic valve is structurally normal, with no evidence of sclerosis or stenosis. There is moderate thickening of the aortic valve. Pulmonic Valve: The pulmonic valve was normal in structure. Pulmonic valve regurgitation is not visualized. Pulmonic regurgitation is not visualized. Aorta: The aortic root, ascending aorta and aortic arch are all structurally normal, with no evidence of dilitation or obstruction. Venous: The inferior vena cava is normal in size with greater than 50% respiratory variability, suggesting right atrial pressure of 3 mmHg. IAS/Shunts: No atrial level shunt detected by color flow Doppler. There is no evidence of a  patent foramen ovale. No ventricular septal defect is seen or detected. There is no evidence of an atrial septal defect.  LEFT VENTRICLE PLAX 2D LVIDd:         2.09 cm LVIDs:         1.36 cm LV PW:         1.46 cm LV IVS:        1.55 cm LVOT diam:     1.70 cm LV SV:         10 ml LV SV Index:   6.08 LVOT Area:     2.27 cm  RIGHT VENTRICLE             IVC RV Basal diam:  1.95 cm     IVC diam: 0.65 cm RV S prime:     10.70 cm/s TAPSE (M-mode): 1.0 cm LEFT ATRIUM           Index LA diam:      3.40 cm 2.13 cm/m LA Vol (A2C): 27.0 ml 16.89 ml/m LA Vol (A4C): 38.4 ml 24.02 ml/m  AORTIC VALVE LVOT Vmax:   122.00 cm/s LVOT Vmean:  81.900 cm/s LVOT VTI:    0.252 m  AORTA Ao Root diam: 2.80 cm Ao Asc diam:  2.80 cm  SHUNTS Systemic VTI:  0.25 m Systemic Diam: 1.70 cm  Fransico Him MD Electronically signed by Fransico Him MD Signature Date/Time: 07/15/2019/3:59:04 PM    Final    CT Maxillofacial Wo Contrast  Result Date: 07/15/2019 CLINICAL DATA:  Seizure, nontraumatic, facial trauma. EXAM: CT HEAD WITHOUT CONTRAST CT MAXILLOFACIAL WITHOUT CONTRAST CT CERVICAL SPINE WITHOUT CONTRAST CT ABDOMEN AND PELVIS WITHOUT CONTRAST TECHNIQUE: Contiguous axial images were obtained from the  base of the skull through the vertex without intravenous contrast. Multidetector CT imaging of the maxillofacial structures was performed. Multiplanar CT image reconstructions were also generated. A small metallic BB was placed on the right temple in order to reliably differentiate right from left. Multidetector CT imaging of the cervical spine was performed without intravenous contrast. Multiplanar CT image reconstructions were also generated. COMPARISON:  CT head and cervical spine 01/09/2015 FINDINGS: CT HEAD FINDINGS Brain: Since prior head CT 01/09/2015 there has been interval increase in size of a partially calcified meningioma with both hyperdense soft tissue components and calcified components. This now measures 2.1 x 1.6 x 2.4 cm  (previously 1.5 cm). There is small volume acute subarachnoid hemorrhage along the anterior left frontal lobe, adjacent to meningioma. A small mixed density left subdural collection is also questioned overlying the anterior left frontal lobe, measuring 5 mm in thickness (series 3, image 14). Additional scattered supratentorial small volume acute subarachnoid hemorrhage, most notably along the lateral right temporal lobe (series 3, image 13). Abnormal cortical/subcortical hypodensity throughout much of the left PCA vascular territory within the left temporal and occipital lobes consistent with infarct, likely early subacute. Associated curvilinear hyperdensity at this site may reflect petechial hemorrhage or additional small volume acute subarachnoid hemorrhage. Redemonstrated small chronic focus of encephalomalacia within the anterolateral right frontal lobe. No midline shift. Ill-defined hypoattenuation within the cerebral white matter is nonspecific, but consistent with chronic small vessel ischemic disease. Moderate generalized parenchymal atrophy. Calcifications redemonstrated within the basal ganglia and cerebellar white matter. Vascular: No definite hyperdense vessel. Atherosclerotic calcifications. Skull: Normal. Negative for fracture or focal lesion. Other: Right mastoid effusion. Redemonstrated left suboccipital scalp lesion measuring 2.4 cm, likely benign CT MAXILLOFACIAL FINDINGS Osseous: Non-displaced fracture of the left nasal bone/frontal process of maxilla. No other maxillofacial fracture is identified. Orbits: No acute abnormality. Sinuses: Mild ethmoid sinus mucosal thickening. Soft tissues: Swelling of the left frontotemporal scalp, forehead and left maxillofacial soft tissues. CT CERVICAL SPINE FINDINGS Alignment: Reversal of the expected cervical lordosis. No significant spondylolisthesis. Skull base and vertebrae: The basion-dental and atlanto-dental intervals are maintained.No evidence of acute  fracture to the cervical spine. Soft tissues and spinal canal: No prevertebral fluid or swelling. No visible canal hematoma. Atherosclerotic disease. Disc levels: Congenital nonunion of the posterior arch of C1. Redemonstrated severe cervical spondylosis with multilevel disc degeneration, posterior disc osteophytes, uncovertebral and facet hypertrophy. Redemonstrated degenerative fusion across the C5-C6 disc space and of the right sided articular pillars at this level. There may also be fusion across the C6-C7 disc space. Degenerative fusion of the C7 and T1 articular pillars on the right. Upper chest: Imaged lung apices clear.  No visible pneumothorax. These results were called by telephone at the time of interpretation on 07/15/2019 at 2:03 pm to provider [facility] , who verbally acknowledged these results. IMPRESSION: CT head: 1. A partially calcified left frontal meningioma has increased in size since head CT 01/09/2015, now 2.1 cm. 2. Small volume acute subarachnoid hemorrhage along the anterior left frontal lobe, adjacent to the meningioma. Additional scattered small volume acute supratentorial subarachnoid hemorrhage, greatest along the lateral right temporal lobe. 3. A small mixed density subdural hematoma is also questioned overlying the anterior left frontal lobe, 5 mm in thickness. 4. Infarction change throughout much of the left PCA vascular territory, likely early subacute. Associated gyriform hyperdensity at this site may reflect petechial hemorrhage or additional small volume acute subarachnoid hemorrhage. 5. Chronic encephalomalacia within the anterolateral right frontal lobe. 6.  Generalized parenchymal atrophy and chronic small vessel ischemic disease. 7. Right mastoid effusion. CT maxillofacial: 1. Nondisplaced fracture of the left nasal bone/frontal process of maxilla. 2. Left frontotemporal scalp, forehead and left maxillofacial soft tissue swelling. 3. Mild ethmoid sinus mucosal  thickening. CT cervical spine: 1. No evidence of acute fracture to the cervical spine. 2. Redemonstrated advanced cervical spondylosis with multiple sites of degenerative fusion, as detailed. Electronically Signed   By: Kellie Simmering DO   On: 07/15/2019 14:14    Pending Labs Unresulted Labs (From admission, onward)    Start     Ordered   07/16/19 0500  Hemoglobin A1c  Tomorrow morning,   R     07/15/19 1506   07/16/19 0500  Lipid panel  Tomorrow morning,   R    Comments: Fasting    07/15/19 1506   07/15/19 1213  Urinalysis, Routine w reflex microscopic  ONCE - STAT,   STAT     07/15/19 1213   07/15/19 1212  CBC WITH DIFFERENTIAL  ONCE - STAT,   STAT     07/15/19 1213          Vitals/Pain Today's Vitals   07/15/19 1719 07/15/19 1720 07/15/19 1817 07/15/19 1900  BP:   (!) 176/75 (!) 181/70  Pulse:  71 71 69  Resp: '13 14 15 15  ' Temp:      TempSrc:      SpO2:  98% 98% 99%    Isolation Precautions No active isolations  Medications Medications  NIFEdipine (PROCARDIA) capsule 10 mg (10 mg Oral Not Given 07/15/19 1727)  levothyroxine (SYNTHROID) tablet 50 mcg (has no administration in time range)  DULoxetine (CYMBALTA) DR capsule 20 mg (0 mg Oral Hold 07/15/19 1725)  ALPRAZolam (XANAX) tablet 0.25 mg (has no administration in time range)  lisinopril (ZESTRIL) tablet 20 mg (has no administration in time range)  latanoprost (XALATAN) 0.005 % ophthalmic solution 1 drop (has no administration in time range)   stroke: mapping our early stages of recovery book (0 each Does not apply Hold 07/15/19 1640)  0.9 %  sodium chloride infusion ( Intravenous Bolus from Bag 07/15/19 1724)  acetaminophen (TYLENOL) tablet 650 mg (has no administration in time range)    Or  acetaminophen (TYLENOL) 160 MG/5ML solution 650 mg (has no administration in time range)    Or  acetaminophen (TYLENOL) suppository 650 mg (has no administration in time range)  senna-docusate (Senokot-S) tablet 1 tablet (has  no administration in time range)    Mobility non-ambulatory High fall risk   Focused Assessments Neuro Assessment Handoff:  Swallow screen pass? No  Cardiac Rhythm: Normal sinus rhythm       Neuro Assessment: Exceptions to WDL Neuro Checks:      Last Documented NIHSS Modified Score:   Has TPA been given? No If patient is a Neuro Trauma and patient is going to OR before floor call report to [place] nurse: [phone] or [phone]     R Recommendations: See Admitting Provider Note  Report given to:   Additional Notes: -

## 2019-07-15 NOTE — ED Notes (Signed)
Pt. Transported to CT 

## 2019-07-15 NOTE — ED Notes (Signed)
X-ray at bedside

## 2019-07-15 NOTE — Progress Notes (Signed)
Echocardiogram 2D Echocardiogram has been performed.  Amber Fowler Amber Fowler 07/15/2019, 3:55 PM

## 2019-07-15 NOTE — Progress Notes (Signed)
MD on call notified informed that patient is NPO. Medication are PO.

## 2019-07-15 NOTE — Telephone Encounter (Signed)
Alerted by Vickii Penna, nurse at [facility], that patient is in ED after noting a nose bleed and hematoma to her forehead. Will alert hospital liaisons and Palliative NP  team for follow up.

## 2019-07-15 NOTE — ED Triage Notes (Signed)
Pt came from home where she gets 24 hr nursing care with home health. Her in-home caretaker noted pt  to have some blood coming from her Lft nostril & reported it to be "not much & was not "pouring" & that pt then began to "shake." EMS reports that the explanation sounded to them as if it was seizure-like activity. EMS also reported that pt has a hematoma to her Lft-forehead that is unknown how it was obtained (per care taker). Pt has a hx of dementia & is at her baseline at this moment, nonverbal, resistant to passive ROM in her arms, on RA sating at 99% upon arrival.

## 2019-07-15 NOTE — H&P (Addendum)
History and Physical    Amber Fowler [id number] DOB: 1919/07/10 DOA: 07/15/2019  PCP: Alanson Puls, [facility]   Patient coming from: Home care  I have personally briefly reviewed patient's old medical records in [place]  Chief Complaint: AMS  HPI: Amber Fowler is a 85 y.o. female with medical history significant of hypertension advanced dementia (baseline nonverbal), chronic ambulatory dysfunction/chair bound, sent from home by her caregiver/24-hour home care nurse, after nosebleeding probably from a unwitnessed fall.  EMS noticed patient has a left forehead hematoma.  According to home care nurse, and has been weaker over the last 2 to 3 days, and has been lethargic. Most of the history obtained from physician and ED staff. ED Course: CT head showed subacute left PCA stroke patient for petechial hemorrhage hemorrhage, volume acute SAH anterior left frontal lobe, small subdural hematoma, increasing size of chronic meningioma.  Patient family and neurology and neurosurgeon already involved.  Neurosurgeon recommend conservative management, neurology is on his way to see patient, family wants to talk to neurology and decide to goal of care.  Patient has been followed up with palliative care for advanced dementia at home.  Review of Systems: Unable to perform patient has baseline nonverbal and advanced dementia   Past Medical History:  Diagnosis Date  . Alzheimer disease ([place])    [name] 01/09/2015  . Anemia   . Cancer of right breast ([place])   . Depression   . Glaucoma   . Hyperlipidemia   . Hypertension   . Hypothyroid   . Lumbar stenosis   . Osteoarthritis   . Osteoarthritis   . Subdural hematoma ([place])   . Thyroid disease   . Vitamin E deficiency     Past Surgical History:  Procedure Laterality Date  . HIP ARTHROPLASTY Left 01/10/2015   Procedure: ARTHROPLASTY BIPOLAR HIP (HEMIARTHROPLASTY);  Surgeon: [name], MD;  Location: [facility];  Service: Orthopedics;   Laterality: Left;  [name] MASTECTOMY Right 2003   [name] 10/01/2010  . ORIF HUMERUS FRACTURE Left 01/10/2015   Procedure: OPEN REDUCTION INTERNAL FIXATION (ORIF) HUMERAL SHAFT FRACTURE;  Surgeon: [name], MD;  Location: [facility];  Service: Orthopedics;  Laterality: Left;     reports that she has never smoked. She has never used smokeless tobacco. She reports that she does not drink alcohol or use drugs.  No Known Allergies  Family history unable to obtain due to patient's mental status  Prior to Admission medications   Medication Sig Start Date End Date Taking? Authorizing Provider  ALPRAZolam (XANAX) 0.25 MG tablet Take 0.25 mg by mouth 1 day or 1 dose. At 4pm 05/06/18   [provider]  amoxicillin (AMOXIL) 500 MG capsule Take 2,000 mg by mouth See admin instructions. One hour prior to dental procedure(s)    [provider]  Cholecalciferol (VITAMIN D) 2000 units CAPS Take 2,000 Units by mouth at bedtime.    [provider]  docusate sodium (COLACE) 100 MG capsule Take 100 mg by mouth 2 (two) times daily.    [provider]  DULoxetine (CYMBALTA) 20 MG capsule Take 20 mg by mouth daily.    [provider]  latanoprost (XALATAN) 0.005 % ophthalmic solution Place 1 drop into both eyes at bedtime.    [provider]  levothyroxine (SYNTHROID, LEVOTHROID) 50 MCG tablet Take 50 mcg by mouth every morning. 0.5 tab (4mcg) every morning    [provider]  Lidocaine HCl (ASPERCREME LIDOCAINE) 4 % LIQD  Apply topically.    [provider]  lisinopril (PRINIVIL,ZESTRIL) 20 MG tablet Take 20 mg by mouth daily.    [provider]  LORazepam (ATIVAN) 0.5 MG tablet Take 0.5 mg by mouth daily as needed for anxiety. Take one tablet by mouth once daily as needed for anxiety    [provider]  miconazole (BAZA ANTIFUNGAL) 2 % cream Apply 1 application topically 3 (three) times daily.    [provider]    mineral oil external liquid 2 drops by Does not apply route. Instill 2 drops into each ear once weekly    [provider]  polyvinyl alcohol (LIQUIFILM TEARS) 1.4 % ophthalmic solution Place 1 drop into both eyes 2 (two) times daily as needed (for irritation or blurred vision).    [provider]    Physical Exam: Vitals:   07/15/19 1221 07/15/19 1230 07/15/19 1245 07/15/19 1300  BP:  (!) 154/105 (!) 170/50 (!) 156/54  Pulse:  66 64 65  Resp:  14 14 11   Temp: 97.7 F (36.5 C)     TempSrc: Rectal     SpO2:  100% 100% 98%    Constitutional: NAD, calm, comfortable Vitals:   07/15/19 1221 07/15/19 1230 07/15/19 1245 07/15/19 1300  BP:  (!) 154/105 (!) 170/50 (!) 156/54  Pulse:  66 64 65  Resp:  14 14 11   Temp: 97.7 F (36.5 C)     TempSrc: Rectal     SpO2:  100% 100% 98%   Eyes: PERRL, lids and conjunctivae normal ENMT: Mucous membranes are moist. Posterior pharynx clear of any exudate or lesions.Normal dentition.  Neck: normal, supple, no masses, no thyromegaly Respiratory: clear to auscultation bilaterally, no wheezing, no crackles. Normal respiratory effort. No accessory muscle use.  Cardiovascular: Regular rate and rhythm, no murmurs / rubs / gallops. No extremity edema. 2+ pedal pulses. No carotid bruits.  Abdomen: no tenderness, no masses palpated. No hepatosplenomegaly. Bowel sounds positive.  Musculoskeletal: no clubbing / cyanosis. No joint deformity upper and lower extremities. Good ROM, no contractures. Normal muscle tone.  Skin: no rashes, lesions, ulcers. No induration Neurologic: Arousable, not in acute distress, moving all her limbs, not following commands, no facial droops Psychiatric: Opens eyes upon hearing her name being called.   Labs on Admission: I have personally reviewed following labs and imaging studies  CBC: Recent Labs  Lab 07/15/19 1250 07/15/19 1335  WBC  --  10.2  NEUTROABS  --  8.0*  HGB 15.3* 13.8  HCT 45.0 43.5  MCV   --  96.2  PLT  --  99991111   Basic Metabolic Panel: Recent Labs  Lab 07/15/19 1250 07/15/19 1335  NA 143 142  K 5.4* 4.3  CL 107 108  CO2  --  24  GLUCOSE 124* 106*  BUN 21 12  CREATININE 0.70 0.68  CALCIUM  --  9.2  MG  --  2.0   GFR: CrCl cannot be calculated (Unknown ideal weight.). Liver Function Tests: Recent Labs  Lab 07/15/19 1335  AST 30  ALT 10  ALKPHOS 51  BILITOT 0.8  PROT 7.2  ALBUMIN 3.3*   No results for input(s): LIPASE, AMYLASE in the last 168 hours. No results for input(s): AMMONIA in the last 168 hours. Coagulation Profile: Recent Labs  Lab 07/15/19 1335  INR 1.0   Cardiac Enzymes: No results for input(s): CKTOTAL, CKMB, CKMBINDEX, TROPONINI in the last 168 hours. BNP (last 3 results) No results for input(s): PROBNP in  the last 8760 hours. HbA1C: No results for input(s): HGBA1C in the last 72 hours. CBG: Recent Labs  Lab 07/15/19 1230  GLUCAP 111*   Lipid Profile: No results for input(s): CHOL, HDL, LDLCALC, TRIG, CHOLHDL, LDLDIRECT in the last 72 hours. Thyroid Function Tests: No results for input(s): TSH, T4TOTAL, FREET4, T3FREE, THYROIDAB in the last 72 hours. Anemia Panel: No results for input(s): VITAMINB12, FOLATE, FERRITIN, TIBC, IRON, RETICCTPCT in the last 72 hours. Urine analysis:    Component Value Date/Time   COLORURINE YELLOW 03/15/2017 1432   APPEARANCEUR CLEAR 03/15/2017 1432   LABSPEC 1.020 03/15/2017 1432   PHURINE 6.0 03/15/2017 1432   GLUCOSEU NEGATIVE 03/15/2017 1432   HGBUR MODERATE (A) 03/15/2017 1432   BILIRUBINUR NEGATIVE 03/15/2017 1432   KETONESUR 20 (A) 03/15/2017 1432   PROTEINUR 100 (A) 03/15/2017 1432   UROBILINOGEN 0.2 01/09/2015 1150   NITRITE NEGATIVE 03/15/2017 1432   LEUKOCYTESUR TRACE (A) 03/15/2017 1432    Radiological Exams on Admission: DG Pelvis 1-2 Views  Result Date: 07/15/2019 CLINICAL DATA:  Seizure.  Nonverbal. EXAM: PELVIS - 1-2 VIEW COMPARISON:  01/10/2015 FINDINGS: The right hip  prosthesis is normally located. No complicating features. The right hip is also normally located. No right hip fracture. The pubic symphysis and SI joints are intact. No definite pelvic fractures. IMPRESSION: No acute bony findings. Electronically Signed   By: Marijo Sanes M.D.   On: 07/15/2019 12:35   CT HEAD WO CONTRAST  Result Date: 07/15/2019 CLINICAL DATA:  Seizure, nontraumatic, facial trauma. EXAM: CT HEAD WITHOUT CONTRAST CT MAXILLOFACIAL WITHOUT CONTRAST CT CERVICAL SPINE WITHOUT CONTRAST CT ABDOMEN AND PELVIS WITHOUT CONTRAST TECHNIQUE: Contiguous axial images were obtained from the base of the skull through the vertex without intravenous contrast. Multidetector CT imaging of the maxillofacial structures was performed. Multiplanar CT image reconstructions were also generated. A small metallic BB was placed on the right temple in order to reliably differentiate right from left. Multidetector CT imaging of the cervical spine was performed without intravenous contrast. Multiplanar CT image reconstructions were also generated. COMPARISON:  CT head and cervical spine 01/09/2015 FINDINGS: CT HEAD FINDINGS Brain: Since prior head CT 01/09/2015 there has been interval increase in size of a partially calcified meningioma with both hyperdense soft tissue components and calcified components. This now measures 2.1 x 1.6 x 2.4 cm (previously 1.5 cm). There is small volume acute subarachnoid hemorrhage along the anterior left frontal lobe, adjacent to meningioma. A small mixed density left subdural collection is also questioned overlying the anterior left frontal lobe, measuring 5 mm in thickness (series 3, image 14). Additional scattered supratentorial small volume acute subarachnoid hemorrhage, most notably along the lateral right temporal lobe (series 3, image 13). Abnormal cortical/subcortical hypodensity throughout much of the left PCA vascular territory within the left temporal and occipital lobes consistent  with infarct, likely early subacute. Associated curvilinear hyperdensity at this site may reflect petechial hemorrhage or additional small volume acute subarachnoid hemorrhage. Redemonstrated small chronic focus of encephalomalacia within the anterolateral right frontal lobe. No midline shift. Ill-defined hypoattenuation within the cerebral white matter is nonspecific, but consistent with chronic small vessel ischemic disease. Moderate generalized parenchymal atrophy. Calcifications redemonstrated within the basal ganglia and cerebellar white matter. Vascular: No definite hyperdense vessel. Atherosclerotic calcifications. Skull: Normal. Negative for fracture or focal lesion. Other: Right mastoid effusion. Redemonstrated left suboccipital scalp lesion measuring 2.4 cm, likely benign CT MAXILLOFACIAL FINDINGS Osseous: Non-displaced fracture of the left nasal bone/frontal process of maxilla. No other maxillofacial fracture  is identified. Orbits: No acute abnormality. Sinuses: Mild ethmoid sinus mucosal thickening. Soft tissues: Swelling of the left frontotemporal scalp, forehead and left maxillofacial soft tissues. CT CERVICAL SPINE FINDINGS Alignment: Reversal of the expected cervical lordosis. No significant spondylolisthesis. Skull base and vertebrae: The basion-dental and atlanto-dental intervals are maintained.No evidence of acute fracture to the cervical spine. Soft tissues and spinal canal: No prevertebral fluid or swelling. No visible canal hematoma. Atherosclerotic disease. Disc levels: Congenital nonunion of the posterior arch of C1. Redemonstrated severe cervical spondylosis with multilevel disc degeneration, posterior disc osteophytes, uncovertebral and facet hypertrophy. Redemonstrated degenerative fusion across the C5-C6 disc space and of the right sided articular pillars at this level. There may also be fusion across the C6-C7 disc space. Degenerative fusion of the C7 and T1 articular pillars on the  right. Upper chest: Imaged lung apices clear.  No visible pneumothorax. These results were called by telephone at the time of interpretation on 07/15/2019 at 2:03 pm to provider [facility] , who verbally acknowledged these results. IMPRESSION: CT head: 1. A partially calcified left frontal meningioma has increased in size since head CT 01/09/2015, now 2.1 cm. 2. Small volume acute subarachnoid hemorrhage along the anterior left frontal lobe, adjacent to the meningioma. Additional scattered small volume acute supratentorial subarachnoid hemorrhage, greatest along the lateral right temporal lobe. 3. A small mixed density subdural hematoma is also questioned overlying the anterior left frontal lobe, 5 mm in thickness. 4. Infarction change throughout much of the left PCA vascular territory, likely early subacute. Associated gyriform hyperdensity at this site may reflect petechial hemorrhage or additional small volume acute subarachnoid hemorrhage. 5. Chronic encephalomalacia within the anterolateral right frontal lobe. 6. Generalized parenchymal atrophy and chronic small vessel ischemic disease. 7. Right mastoid effusion. CT maxillofacial: 1. Nondisplaced fracture of the left nasal bone/frontal process of maxilla. 2. Left frontotemporal scalp, forehead and left maxillofacial soft tissue swelling. 3. Mild ethmoid sinus mucosal thickening. CT cervical spine: 1. No evidence of acute fracture to the cervical spine. 2. Redemonstrated advanced cervical spondylosis with multiple sites of degenerative fusion, as detailed. Electronically Signed   By: Kellie Simmering DO   On: 07/15/2019 14:14   CT CERVICAL SPINE WO CONTRAST  Result Date: 07/15/2019 CLINICAL DATA:  Seizure, nontraumatic, facial trauma. EXAM: CT HEAD WITHOUT CONTRAST CT MAXILLOFACIAL WITHOUT CONTRAST CT CERVICAL SPINE WITHOUT CONTRAST CT ABDOMEN AND PELVIS WITHOUT CONTRAST TECHNIQUE: Contiguous axial images were obtained from the base of the skull through  the vertex without intravenous contrast. Multidetector CT imaging of the maxillofacial structures was performed. Multiplanar CT image reconstructions were also generated. A small metallic BB was placed on the right temple in order to reliably differentiate right from left. Multidetector CT imaging of the cervical spine was performed without intravenous contrast. Multiplanar CT image reconstructions were also generated. COMPARISON:  CT head and cervical spine 01/09/2015 FINDINGS: CT HEAD FINDINGS Brain: Since prior head CT 01/09/2015 there has been interval increase in size of a partially calcified meningioma with both hyperdense soft tissue components and calcified components. This now measures 2.1 x 1.6 x 2.4 cm (previously 1.5 cm). There is small volume acute subarachnoid hemorrhage along the anterior left frontal lobe, adjacent to meningioma. A small mixed density left subdural collection is also questioned overlying the anterior left frontal lobe, measuring 5 mm in thickness (series 3, image 14). Additional scattered supratentorial small volume acute subarachnoid hemorrhage, most notably along the lateral right temporal lobe (series 3, image 13). Abnormal cortical/subcortical hypodensity throughout much  of the left PCA vascular territory within the left temporal and occipital lobes consistent with infarct, likely early subacute. Associated curvilinear hyperdensity at this site may reflect petechial hemorrhage or additional small volume acute subarachnoid hemorrhage. Redemonstrated small chronic focus of encephalomalacia within the anterolateral right frontal lobe. No midline shift. Ill-defined hypoattenuation within the cerebral white matter is nonspecific, but consistent with chronic small vessel ischemic disease. Moderate generalized parenchymal atrophy. Calcifications redemonstrated within the basal ganglia and cerebellar white matter. Vascular: No definite hyperdense vessel. Atherosclerotic calcifications.  Skull: Normal. Negative for fracture or focal lesion. Other: Right mastoid effusion. Redemonstrated left suboccipital scalp lesion measuring 2.4 cm, likely benign CT MAXILLOFACIAL FINDINGS Osseous: Non-displaced fracture of the left nasal bone/frontal process of maxilla. No other maxillofacial fracture is identified. Orbits: No acute abnormality. Sinuses: Mild ethmoid sinus mucosal thickening. Soft tissues: Swelling of the left frontotemporal scalp, forehead and left maxillofacial soft tissues. CT CERVICAL SPINE FINDINGS Alignment: Reversal of the expected cervical lordosis. No significant spondylolisthesis. Skull base and vertebrae: The basion-dental and atlanto-dental intervals are maintained.No evidence of acute fracture to the cervical spine. Soft tissues and spinal canal: No prevertebral fluid or swelling. No visible canal hematoma. Atherosclerotic disease. Disc levels: Congenital nonunion of the posterior arch of C1. Redemonstrated severe cervical spondylosis with multilevel disc degeneration, posterior disc osteophytes, uncovertebral and facet hypertrophy. Redemonstrated degenerative fusion across the C5-C6 disc space and of the right sided articular pillars at this level. There may also be fusion across the C6-C7 disc space. Degenerative fusion of the C7 and T1 articular pillars on the right. Upper chest: Imaged lung apices clear.  No visible pneumothorax. These results were called by telephone at the time of interpretation on 07/15/2019 at 2:03 pm to provider [facility] , who verbally acknowledged these results. IMPRESSION: CT head: 1. A partially calcified left frontal meningioma has increased in size since head CT 01/09/2015, now 2.1 cm. 2. Small volume acute subarachnoid hemorrhage along the anterior left frontal lobe, adjacent to the meningioma. Additional scattered small volume acute supratentorial subarachnoid hemorrhage, greatest along the lateral right temporal lobe. 3. A small mixed density  subdural hematoma is also questioned overlying the anterior left frontal lobe, 5 mm in thickness. 4. Infarction change throughout much of the left PCA vascular territory, likely early subacute. Associated gyriform hyperdensity at this site may reflect petechial hemorrhage or additional small volume acute subarachnoid hemorrhage. 5. Chronic encephalomalacia within the anterolateral right frontal lobe. 6. Generalized parenchymal atrophy and chronic small vessel ischemic disease. 7. Right mastoid effusion. CT maxillofacial: 1. Nondisplaced fracture of the left nasal bone/frontal process of maxilla. 2. Left frontotemporal scalp, forehead and left maxillofacial soft tissue swelling. 3. Mild ethmoid sinus mucosal thickening. CT cervical spine: 1. No evidence of acute fracture to the cervical spine. 2. Redemonstrated advanced cervical spondylosis with multiple sites of degenerative fusion, as detailed. Electronically Signed   By: Kellie Simmering DO   On: 07/15/2019 14:14   DG Chest Port 1 View  Result Date: 07/15/2019 CLINICAL DATA:  Seizure EXAM: PORTABLE CHEST 1 VIEW COMPARISON:  03/15/2017 FINDINGS: Heart size appears within normal limits. Calcified thoracic aorta. Lung volumes are low. No focal airspace consolidation, pleural effusion, or pneumothorax. Osseous structures appear diffusely demineralized. Partially visualized ORIF hardware of the proximal left humerus. Severe degenerative changes of the shoulders. Linear lucency through the posterosuperior aspect of the right scapula suspicious for fracture. IMPRESSION: 1. Linear lucency through the posterosuperior aspect of the right scapula suspicious for fracture. 2. No acute cardiopulmonary  findings. Electronically Signed   By: Davina Poke M.D.   On: 07/15/2019 12:35   CT Maxillofacial Wo Contrast  Result Date: 07/15/2019 CLINICAL DATA:  Seizure, nontraumatic, facial trauma. EXAM: CT HEAD WITHOUT CONTRAST CT MAXILLOFACIAL WITHOUT CONTRAST CT CERVICAL SPINE  WITHOUT CONTRAST CT ABDOMEN AND PELVIS WITHOUT CONTRAST TECHNIQUE: Contiguous axial images were obtained from the base of the skull through the vertex without intravenous contrast. Multidetector CT imaging of the maxillofacial structures was performed. Multiplanar CT image reconstructions were also generated. A small metallic BB was placed on the right temple in order to reliably differentiate right from left. Multidetector CT imaging of the cervical spine was performed without intravenous contrast. Multiplanar CT image reconstructions were also generated. COMPARISON:  CT head and cervical spine 01/09/2015 FINDINGS: CT HEAD FINDINGS Brain: Since prior head CT 01/09/2015 there has been interval increase in size of a partially calcified meningioma with both hyperdense soft tissue components and calcified components. This now measures 2.1 x 1.6 x 2.4 cm (previously 1.5 cm). There is small volume acute subarachnoid hemorrhage along the anterior left frontal lobe, adjacent to meningioma. A small mixed density left subdural collection is also questioned overlying the anterior left frontal lobe, measuring 5 mm in thickness (series 3, image 14). Additional scattered supratentorial small volume acute subarachnoid hemorrhage, most notably along the lateral right temporal lobe (series 3, image 13). Abnormal cortical/subcortical hypodensity throughout much of the left PCA vascular territory within the left temporal and occipital lobes consistent with infarct, likely early subacute. Associated curvilinear hyperdensity at this site may reflect petechial hemorrhage or additional small volume acute subarachnoid hemorrhage. Redemonstrated small chronic focus of encephalomalacia within the anterolateral right frontal lobe. No midline shift. Ill-defined hypoattenuation within the cerebral white matter is nonspecific, but consistent with chronic small vessel ischemic disease. Moderate generalized parenchymal atrophy. Calcifications  redemonstrated within the basal ganglia and cerebellar white matter. Vascular: No definite hyperdense vessel. Atherosclerotic calcifications. Skull: Normal. Negative for fracture or focal lesion. Other: Right mastoid effusion. Redemonstrated left suboccipital scalp lesion measuring 2.4 cm, likely benign CT MAXILLOFACIAL FINDINGS Osseous: Non-displaced fracture of the left nasal bone/frontal process of maxilla. No other maxillofacial fracture is identified. Orbits: No acute abnormality. Sinuses: Mild ethmoid sinus mucosal thickening. Soft tissues: Swelling of the left frontotemporal scalp, forehead and left maxillofacial soft tissues. CT CERVICAL SPINE FINDINGS Alignment: Reversal of the expected cervical lordosis. No significant spondylolisthesis. Skull base and vertebrae: The basion-dental and atlanto-dental intervals are maintained.No evidence of acute fracture to the cervical spine. Soft tissues and spinal canal: No prevertebral fluid or swelling. No visible canal hematoma. Atherosclerotic disease. Disc levels: Congenital nonunion of the posterior arch of C1. Redemonstrated severe cervical spondylosis with multilevel disc degeneration, posterior disc osteophytes, uncovertebral and facet hypertrophy. Redemonstrated degenerative fusion across the C5-C6 disc space and of the right sided articular pillars at this level. There may also be fusion across the C6-C7 disc space. Degenerative fusion of the C7 and T1 articular pillars on the right. Upper chest: Imaged lung apices clear.  No visible pneumothorax. These results were called by telephone at the time of interpretation on 07/15/2019 at 2:03 pm to provider [facility] , who verbally acknowledged these results. IMPRESSION: CT head: 1. A partially calcified left frontal meningioma has increased in size since head CT 01/09/2015, now 2.1 cm. 2. Small volume acute subarachnoid hemorrhage along the anterior left frontal lobe, adjacent to the meningioma. Additional  scattered small volume acute supratentorial subarachnoid hemorrhage, greatest along the lateral right temporal lobe.  3. A small mixed density subdural hematoma is also questioned overlying the anterior left frontal lobe, 5 mm in thickness. 4. Infarction change throughout much of the left PCA vascular territory, likely early subacute. Associated gyriform hyperdensity at this site may reflect petechial hemorrhage or additional small volume acute subarachnoid hemorrhage. 5. Chronic encephalomalacia within the anterolateral right frontal lobe. 6. Generalized parenchymal atrophy and chronic small vessel ischemic disease. 7. Right mastoid effusion. CT maxillofacial: 1. Nondisplaced fracture of the left nasal bone/frontal process of maxilla. 2. Left frontotemporal scalp, forehead and left maxillofacial soft tissue swelling. 3. Mild ethmoid sinus mucosal thickening. CT cervical spine: 1. No evidence of acute fracture to the cervical spine. 2. Redemonstrated advanced cervical spondylosis with multiple sites of degenerative fusion, as detailed. Electronically Signed   By: Kellie Simmering DO   On: 07/15/2019 14:14    EKG: Independently reviewed.   Assessment/Plan Active Problems:   SAH (subarachnoid hemorrhage) (HCC)   Subdural hematoma due to concussion ([place])   Stroke (cerebrum) (HCC)   AMS (altered mental status)  AMS, likely related to the subacute Left PCA stroke, plus possible concussion and intracranial hemorrhage including SAH and subdural hematoma.  Prognosis poor, patient family made aware and wanted to talk to neurologist regarding prognosis and further goals of care plan.   Subacute stroke, has features of petechia hemorrhagic and SAH and subdural hematoma, will hold off aspirin for now, tighter control of blood pressure, add CCB to prevent vasospasm from [facility]. Continue statin. PT/OT and speech evaluation. Hold off chemical DVT prophylaxis for now.  SAH and subdural hematoma, given patient has a frontal  scalp hematoma, likely she had a fall and concussion. CCB as written above.  Nondisplaced fracture of the left nasal bone/frontal process of maxilla, likely associated with the fall, and then caused the nose bleed. Nose bleed stopped and patient has no breathing issue, hold off aggressive management until family makes a final decision about her goal of care.  Hypertension, Nifedipine Q6H, PO BP meds once passed swallow evaluation.  Advanced dementia, palliative care was also notified, family to decide goals of care.  DVT prophylaxis:SCD (intracranial bleed) Code Status: DNR Family Communication: Family talked to ED physician and willing to talk to neurology about prognosis and decide next step. Disposition Plan: Hospice if lack improvement? Consults called: [place] (coming in to see patient today) Admission status: Telemetry admission   Lequita Halt MD [facility] Pager [phone]  If 7PM-7AM, please contact night-coverage [email or web address] Password [email or web address]  07/15/2019, 3:15 PM

## 2019-07-15 NOTE — Consult Note (Addendum)
NEURO HOSPITALIST  CONSULT   Requesting Physician: Dr. Roosevelt Locks    Chief Complaint: fall  History obtained from:  Chart review  HPI:                                                                                                                                         Amber Fowler is an 85 y.o. female  With PMH HTN, HLD, dementia ( advanced baseline non-verbal), chronic ambulatory dysfunction chair bound who came to hospital after an unwitnessed fall. Neurology consulted for CVA and SAH on CT.    Patient lives at home with 24 hour nursing care. Her Home health aid reported that she has been getting weaker and increased lethargy over the past 2-3 days.  Patient came in after a nosebleed after suspected unwitnessed fall.   ED course:  CT head showed subacute left PCA stroke patient for petechial hemorrhage hemorrhage, volume acute SAH anterior left frontal lobe, small subdural hematoma, increasing size of chronic meningioma.  Modified Rankin: Rankin Score=4      Past Medical History:  Diagnosis Date  . Alzheimer disease ([place])    [name] 01/09/2015  . Anemia   . Cancer of right breast ([place])   . Depression   . Glaucoma   . Hyperlipidemia   . Hypertension   . Hypothyroid   . Lumbar stenosis   . Osteoarthritis   . Osteoarthritis   . Subdural hematoma ([place])   . Thyroid disease   . Vitamin E deficiency     Past Surgical History:  Procedure Laterality Date  . HIP ARTHROPLASTY Left 01/10/2015   Procedure: ARTHROPLASTY BIPOLAR HIP (HEMIARTHROPLASTY);  Surgeon: [name], MD;  Location: [facility];  Service: Orthopedics;  Laterality: Left;  [name] MASTECTOMY Right 2003   [name] 10/01/2010  . ORIF HUMERUS FRACTURE Left 01/10/2015   Procedure: OPEN REDUCTION INTERNAL FIXATION (ORIF) HUMERAL SHAFT FRACTURE;  Surgeon: [name], MD;  Location: [facility];  Service: Orthopedics;  Laterality: Left;    History reviewed. No pertinent  family history.       Social History:  reports that she has never smoked. She has never used smokeless tobacco. She reports that she does not drink alcohol or use drugs.  Allergies: No Known Allergies  Medications:  Current Facility-Administered Medications  Medication Dose Route Frequency Provider Last Rate Last Admin  .  stroke: mapping our early stages of recovery book   Does not apply Once Lequita Halt, MD   Stopped at 07/15/19 1640  . 0.9 %  sodium chloride infusion   Intravenous Continuous Wynetta Fines T, MD 50 mL/hr at 07/15/19 1724 Bolus from Bag at 07/15/19 1724  . acetaminophen (TYLENOL) tablet 650 mg  650 mg Oral Q4H PRN Lequita Halt, MD       Or  . acetaminophen (TYLENOL) 160 MG/5ML solution 650 mg  650 mg Per Tube Q4H PRN Wynetta Fines T, MD       Or  . acetaminophen (TYLENOL) suppository 650 mg  650 mg Rectal Q4H PRN Wynetta Fines T, MD      . ALPRAZolam Duanne Moron) tablet 0.25 mg  0.25 mg Oral Daily PRN Wynetta Fines T, MD      . DULoxetine (CYMBALTA) DR capsule 20 mg  20 mg Oral Daily Lequita Halt, MD   Stopped at 07/15/19 1725  . latanoprost (XALATAN) 0.005 % ophthalmic solution 1 drop  1 drop Both Eyes QHS Wynetta Fines T, MD      . Derrill Memo ON 07/16/2019] levothyroxine (SYNTHROID) tablet 50 mcg  50 mcg Oral QAC breakfast Wynetta Fines T, MD      . Derrill Memo ON 07/16/2019] lisinopril (ZESTRIL) tablet 20 mg  20 mg Oral Daily Wynetta Fines T, MD      . NIFEdipine (PROCARDIA) capsule 10 mg  10 mg Oral Q8H Zhang, Pearletha Forge T, MD      . senna-docusate (Senokot-S) tablet 1 tablet  1 tablet Oral QHS PRN Lequita Halt, MD       Current Outpatient Medications  Medication Sig Dispense Refill  . ALPRAZolam (XANAX) 0.25 MG tablet Take 0.25 mg by mouth 1 day or 1 dose. At 4pm    . Cholecalciferol (VITAMIN D) 2000 units CAPS Take 2,000 Units by mouth daily.     . DULoxetine (CYMBALTA) 20  MG capsule Take 20 mg by mouth See admin instructions. Take 20mg  every day except Wednesday.    . latanoprost (XALATAN) 0.005 % ophthalmic solution Place 1 drop into both eyes at bedtime.    [name] levothyroxine (SYNTHROID, LEVOTHROID) 50 MCG tablet Take 25 mcg by mouth daily.     . Lidocaine (ASPERCREME MAX STRENGTH EX) Apply 1 application topically 2 (two) times daily. To Shoulders    . lisinopril (PRINIVIL,ZESTRIL) 20 MG tablet Take 20 mg by mouth daily.    . Mineral Oil OIL Place 2 drops into both ears once a week.    Vladimir Faster Glycol-Propyl Glycol (SYSTANE ULTRA) 0.4-0.3 % SOLN Place 1 drop into both eyes 3 (three) times daily.    . polyethylene glycol (MIRALAX / GLYCOLAX) 17 g packet Take 17 g by mouth daily. Hold for diarrhea    . Skin Protectants, Misc. (BAZA PROTECT EX) Apply 1 application topically See admin instructions. Apply to buttocks and coccyx with every brief change.       ROS:  unobtainable from patient due to mental status    General Examination:                                                                                                      Blood pressure (!) 156/54, pulse 65, temperature 97.7 F (36.5 C), temperature source Rectal, resp. rate 11, SpO2 98 %.  Physical Exam  Constitutional: Appears well-developed and well-nourished.  Psych: Affect appropriate to situation Eyes: Normal external eye and conjunctiva. HENT: Normocephalic, no lesions, without obvious abnormality.   Musculoskeletal-no joint tenderness, deformity or swelling Cardiovascular: Normal rate and regular rhythm.  Respiratory: Effort normal, non-labored breathing saturations WNL GI: Soft.  No distension. There is no tenderness.  Skin: WDI  Neurological Examination Mental Status: demented not oriented. No dysarthric speech. No gaze deviation Cranial  Nerves: Patient keeps eyes closed. Face appears symmetric Motor/sensory Grimaces to noxious stimuli in all 4 extremities.  BUE contracted with normal tone.  Plantars: Right: downgoing   Left: downgoing Cerebellar: UTA Gait: UTA   Lab Results: Basic Metabolic Panel: Recent Labs  Lab 07/15/19 1250 07/15/19 1335  NA 143 142  K 5.4* 4.3  CL 107 108  CO2  --  24  GLUCOSE 124* 106*  BUN 21 12  CREATININE 0.70 0.68  CALCIUM  --  9.2  MG  --  2.0    CBC: Recent Labs  Lab 07/15/19 1250 07/15/19 1335  WBC  --  10.2  NEUTROABS  --  8.0*  HGB 15.3* 13.8  HCT 45.0 43.5  MCV  --  96.2  PLT  --  206    CBG: Recent Labs  Lab 07/15/19 1230  GLUCAP 111*    Imaging: DG Pelvis 1-2 Views  Result Date: 07/15/2019 CLINICAL DATA:  Seizure.  Nonverbal. EXAM: PELVIS - 1-2 VIEW COMPARISON:  01/10/2015 FINDINGS: The right hip prosthesis is normally located. No complicating features. The right hip is also normally located. No right hip fracture. The pubic symphysis and SI joints are intact. No definite pelvic fractures. IMPRESSION: No acute bony findings. Electronically Signed   By: Marijo Sanes M.D.   On: 07/15/2019 12:35   CT HEAD WO CONTRAST  Result Date: 07/15/2019 CLINICAL DATA:  Seizure, nontraumatic, facial trauma. EXAM: CT HEAD WITHOUT CONTRAST CT MAXILLOFACIAL WITHOUT CONTRAST CT CERVICAL SPINE WITHOUT CONTRAST CT ABDOMEN AND PELVIS WITHOUT CONTRAST TECHNIQUE: Contiguous axial images were obtained from the base of the skull through the vertex without intravenous contrast. Multidetector CT imaging of the maxillofacial structures was performed. Multiplanar CT image reconstructions were also generated. A small metallic BB was placed on the right temple in order to reliably differentiate right from left. Multidetector CT imaging of the cervical spine was performed without intravenous contrast. Multiplanar CT image reconstructions were also generated. COMPARISON:  CT head and cervical  spine 01/09/2015 FINDINGS: CT HEAD FINDINGS Brain: Since prior head CT 01/09/2015 there has been interval increase in size of a partially calcified meningioma with both hyperdense soft tissue components and calcified components. This now measures 2.1 x 1.6 x 2.4 cm (previously 1.5 cm). There  is small volume acute subarachnoid hemorrhage along the anterior left frontal lobe, adjacent to meningioma. A small mixed density left subdural collection is also questioned overlying the anterior left frontal lobe, measuring 5 mm in thickness (series 3, image 14). Additional scattered supratentorial small volume acute subarachnoid hemorrhage, most notably along the lateral right temporal lobe (series 3, image 13). Abnormal cortical/subcortical hypodensity throughout much of the left PCA vascular territory within the left temporal and occipital lobes consistent with infarct, likely early subacute. Associated curvilinear hyperdensity at this site may reflect petechial hemorrhage or additional small volume acute subarachnoid hemorrhage. Redemonstrated small chronic focus of encephalomalacia within the anterolateral right frontal lobe. No midline shift. Ill-defined hypoattenuation within the cerebral white matter is nonspecific, but consistent with chronic small vessel ischemic disease. Moderate generalized parenchymal atrophy. Calcifications redemonstrated within the basal ganglia and cerebellar white matter. Vascular: No definite hyperdense vessel. Atherosclerotic calcifications. Skull: Normal. Negative for fracture or focal lesion. Other: Right mastoid effusion. Redemonstrated left suboccipital scalp lesion measuring 2.4 cm, likely benign CT MAXILLOFACIAL FINDINGS Osseous: Non-displaced fracture of the left nasal bone/frontal process of maxilla. No other maxillofacial fracture is identified. Orbits: No acute abnormality. Sinuses: Mild ethmoid sinus mucosal thickening. Soft tissues: Swelling of the left frontotemporal scalp,  forehead and left maxillofacial soft tissues. CT CERVICAL SPINE FINDINGS Alignment: Reversal of the expected cervical lordosis. No significant spondylolisthesis. Skull base and vertebrae: The basion-dental and atlanto-dental intervals are maintained.No evidence of acute fracture to the cervical spine. Soft tissues and spinal canal: No prevertebral fluid or swelling. No visible canal hematoma. Atherosclerotic disease. Disc levels: Congenital nonunion of the posterior arch of C1. Redemonstrated severe cervical spondylosis with multilevel disc degeneration, posterior disc osteophytes, uncovertebral and facet hypertrophy. Redemonstrated degenerative fusion across the C5-C6 disc space and of the right sided articular pillars at this level. There may also be fusion across the C6-C7 disc space. Degenerative fusion of the C7 and T1 articular pillars on the right. Upper chest: Imaged lung apices clear.  No visible pneumothorax. These results were called by telephone at the time of interpretation on 07/15/2019 at 2:03 pm to provider [facility] , who verbally acknowledged these results. IMPRESSION: CT head: 1. A partially calcified left frontal meningioma has increased in size since head CT 01/09/2015, now 2.1 cm. 2. Small volume acute subarachnoid hemorrhage along the anterior left frontal lobe, adjacent to the meningioma. Additional scattered small volume acute supratentorial subarachnoid hemorrhage, greatest along the lateral right temporal lobe. 3. A small mixed density subdural hematoma is also questioned overlying the anterior left frontal lobe, 5 mm in thickness. 4. Infarction change throughout much of the left PCA vascular territory, likely early subacute. Associated gyriform hyperdensity at this site may reflect petechial hemorrhage or additional small volume acute subarachnoid hemorrhage. 5. Chronic encephalomalacia within the anterolateral right frontal lobe. 6. Generalized parenchymal atrophy and chronic small  vessel ischemic disease. 7. Right mastoid effusion. CT maxillofacial: 1. Nondisplaced fracture of the left nasal bone/frontal process of maxilla. 2. Left frontotemporal scalp, forehead and left maxillofacial soft tissue swelling. 3. Mild ethmoid sinus mucosal thickening. CT cervical spine: 1. No evidence of acute fracture to the cervical spine. 2. Redemonstrated advanced cervical spondylosis with multiple sites of degenerative fusion, as detailed. Electronically Signed   By: Kellie Simmering DO   On: 07/15/2019 14:14   CT CERVICAL SPINE WO CONTRAST  Result Date: 07/15/2019 CLINICAL DATA:  Seizure, nontraumatic, facial trauma. EXAM: CT HEAD WITHOUT CONTRAST CT MAXILLOFACIAL WITHOUT CONTRAST CT CERVICAL SPINE WITHOUT CONTRAST  CT ABDOMEN AND PELVIS WITHOUT CONTRAST TECHNIQUE: Contiguous axial images were obtained from the base of the skull through the vertex without intravenous contrast. Multidetector CT imaging of the maxillofacial structures was performed. Multiplanar CT image reconstructions were also generated. A small metallic BB was placed on the right temple in order to reliably differentiate right from left. Multidetector CT imaging of the cervical spine was performed without intravenous contrast. Multiplanar CT image reconstructions were also generated. COMPARISON:  CT head and cervical spine 01/09/2015 FINDINGS: CT HEAD FINDINGS Brain: Since prior head CT 01/09/2015 there has been interval increase in size of a partially calcified meningioma with both hyperdense soft tissue components and calcified components. This now measures 2.1 x 1.6 x 2.4 cm (previously 1.5 cm). There is small volume acute subarachnoid hemorrhage along the anterior left frontal lobe, adjacent to meningioma. A small mixed density left subdural collection is also questioned overlying the anterior left frontal lobe, measuring 5 mm in thickness (series 3, image 14). Additional scattered supratentorial small volume acute subarachnoid  hemorrhage, most notably along the lateral right temporal lobe (series 3, image 13). Abnormal cortical/subcortical hypodensity throughout much of the left PCA vascular territory within the left temporal and occipital lobes consistent with infarct, likely early subacute. Associated curvilinear hyperdensity at this site may reflect petechial hemorrhage or additional small volume acute subarachnoid hemorrhage. Redemonstrated small chronic focus of encephalomalacia within the anterolateral right frontal lobe. No midline shift. Ill-defined hypoattenuation within the cerebral white matter is nonspecific, but consistent with chronic small vessel ischemic disease. Moderate generalized parenchymal atrophy. Calcifications redemonstrated within the basal ganglia and cerebellar white matter. Vascular: No definite hyperdense vessel. Atherosclerotic calcifications. Skull: Normal. Negative for fracture or focal lesion. Other: Right mastoid effusion. Redemonstrated left suboccipital scalp lesion measuring 2.4 cm, likely benign CT MAXILLOFACIAL FINDINGS Osseous: Non-displaced fracture of the left nasal bone/frontal process of maxilla. No other maxillofacial fracture is identified. Orbits: No acute abnormality. Sinuses: Mild ethmoid sinus mucosal thickening. Soft tissues: Swelling of the left frontotemporal scalp, forehead and left maxillofacial soft tissues. CT CERVICAL SPINE FINDINGS Alignment: Reversal of the expected cervical lordosis. No significant spondylolisthesis. Skull base and vertebrae: The basion-dental and atlanto-dental intervals are maintained.No evidence of acute fracture to the cervical spine. Soft tissues and spinal canal: No prevertebral fluid or swelling. No visible canal hematoma. Atherosclerotic disease. Disc levels: Congenital nonunion of the posterior arch of C1. Redemonstrated severe cervical spondylosis with multilevel disc degeneration, posterior disc osteophytes, uncovertebral and facet hypertrophy.  Redemonstrated degenerative fusion across the C5-C6 disc space and of the right sided articular pillars at this level. There may also be fusion across the C6-C7 disc space. Degenerative fusion of the C7 and T1 articular pillars on the right. Upper chest: Imaged lung apices clear.  No visible pneumothorax. These results were called by telephone at the time of interpretation on 07/15/2019 at 2:03 pm to provider [facility] , who verbally acknowledged these results. IMPRESSION: CT head: 1. A partially calcified left frontal meningioma has increased in size since head CT 01/09/2015, now 2.1 cm. 2. Small volume acute subarachnoid hemorrhage along the anterior left frontal lobe, adjacent to the meningioma. Additional scattered small volume acute supratentorial subarachnoid hemorrhage, greatest along the lateral right temporal lobe. 3. A small mixed density subdural hematoma is also questioned overlying the anterior left frontal lobe, 5 mm in thickness. 4. Infarction change throughout much of the left PCA vascular territory, likely early subacute. Associated gyriform hyperdensity at this site may reflect petechial hemorrhage or additional small  volume acute subarachnoid hemorrhage. 5. Chronic encephalomalacia within the anterolateral right frontal lobe. 6. Generalized parenchymal atrophy and chronic small vessel ischemic disease. 7. Right mastoid effusion. CT maxillofacial: 1. Nondisplaced fracture of the left nasal bone/frontal process of maxilla. 2. Left frontotemporal scalp, forehead and left maxillofacial soft tissue swelling. 3. Mild ethmoid sinus mucosal thickening. CT cervical spine: 1. No evidence of acute fracture to the cervical spine. 2. Redemonstrated advanced cervical spondylosis with multiple sites of degenerative fusion, as detailed. Electronically Signed   By: Kellie Simmering DO   On: 07/15/2019 14:14   DG Chest Port 1 View  Result Date: 07/15/2019 CLINICAL DATA:  Seizure EXAM: PORTABLE CHEST 1 VIEW  COMPARISON:  03/15/2017 FINDINGS: Heart size appears within normal limits. Calcified thoracic aorta. Lung volumes are low. No focal airspace consolidation, pleural effusion, or pneumothorax. Osseous structures appear diffusely demineralized. Partially visualized ORIF hardware of the proximal left humerus. Severe degenerative changes of the shoulders. Linear lucency through the posterosuperior aspect of the right scapula suspicious for fracture. IMPRESSION: 1. Linear lucency through the posterosuperior aspect of the right scapula suspicious for fracture. 2. No acute cardiopulmonary findings. Electronically Signed   By: Davina Poke M.D.   On: 07/15/2019 12:35   ECHOCARDIOGRAM COMPLETE  Result Date: 07/15/2019   ECHOCARDIOGRAM REPORT   Patient Name:   AFUA BOST Date of Exam: 07/15/2019 Medical Rec #:  [id number]        Height:       66.0 in Accession #:    [id number]       Weight:       118.0 lb Date of Birth:  March 24, 1919       BSA:          1.60 m Patient Age:    75 years         BP:           156/54 mmHg Patient Gender: F                HR:           71 bpm. Exam Location:  Inpatient Procedure: 2D Echo, Color Doppler and Cardiac Doppler Indications:    Stroke  History:        Patient has no prior history of Echocardiogram examinations.                 Stroke; Risk Factors:Hypertension and Dyslipidemia.  Sonographer:    Raquel Sarna Senior RDCS Referring Phys: [id number] [place]  1. Left ventricular ejection fraction, by visual estimation, is 65 to 70%. The left ventricle has normal function. Left ventricular septal wall thickness was severely increased. Severely increased left ventricular posterior wall thickness. There is no left ventricular hypertrophy.  2. Left ventricular diastolic parameters are consistent with Grade I diastolic dysfunction (impaired relaxation).  3. The left ventricle has no regional wall motion abnormalities.  4. Global right ventricle has normal systolic function.The  right ventricular size is normal. No increase in right ventricular wall thickness.  5. Left atrial size was normal.  6. Right atrial size was normal.  7. Mild thickening of the anterior and posterior mitral valve leaflet(s).  8. Moderate mitral annular calcification.  9. The mitral valve is normal in structure. No evidence of mitral valve regurgitation. No evidence of mitral stenosis. 10. The tricuspid valve is normal in structure. Tricuspid valve regurgitation is not demonstrated. 11. The aortic valve is tricuspid. Aortic valve regurgitation is not visualized. No evidence of aortic valve  sclerosis or stenosis. 12. The pulmonic valve was normal in structure. Pulmonic valve regurgitation is not visualized. 13. TR signal is inadequate for assessing pulmonary artery systolic pressure. 14. The inferior vena cava is normal in size with greater than 50% respiratory variability, suggesting right atrial pressure of 3 mmHg. FINDINGS  Left Ventricle: Left ventricular ejection fraction, by visual estimation, is 65 to 70%. The left ventricle has normal function. The left ventricle has no regional wall motion abnormalities. Severely increased left ventricular posterior wall thickness. There is no left ventricular hypertrophy. Left ventricular diastolic parameters are consistent with Grade I diastolic dysfunction (impaired relaxation). Right Ventricle: The right ventricular size is normal. No increase in right ventricular wall thickness. Global RV systolic function is has normal systolic function. Left Atrium: Left atrial size was normal in size. Right Atrium: Right atrial size was normal in size Pericardium: There is no evidence of pericardial effusion. Mitral Valve: The mitral valve is normal in structure. There is mild thickening of the anterior and posterior mitral valve leaflet(s). Moderate mitral annular calcification. No evidence of mitral valve regurgitation. No evidence of mitral valve stenosis by observation. Tricuspid  Valve: The tricuspid valve is normal in structure. Tricuspid valve regurgitation is not demonstrated. Aortic Valve: The aortic valve is tricuspid. . There is moderate thickening of the aortic valve. Aortic valve regurgitation is not visualized. The aortic valve is structurally normal, with no evidence of sclerosis or stenosis. There is moderate thickening of the aortic valve. Pulmonic Valve: The pulmonic valve was normal in structure. Pulmonic valve regurgitation is not visualized. Pulmonic regurgitation is not visualized. Aorta: The aortic root, ascending aorta and aortic arch are all structurally normal, with no evidence of dilitation or obstruction. Venous: The inferior vena cava is normal in size with greater than 50% respiratory variability, suggesting right atrial pressure of 3 mmHg. IAS/Shunts: No atrial level shunt detected by color flow Doppler. There is no evidence of a patent foramen ovale. No ventricular septal defect is seen or detected. There is no evidence of an atrial septal defect.  LEFT VENTRICLE PLAX 2D LVIDd:         2.09 cm LVIDs:         1.36 cm LV PW:         1.46 cm LV IVS:        1.55 cm LVOT diam:     1.70 cm LV SV:         10 ml LV SV Index:   6.08 LVOT Area:     2.27 cm  RIGHT VENTRICLE             IVC RV Basal diam:  1.95 cm     IVC diam: 0.65 cm RV S prime:     10.70 cm/s TAPSE (M-mode): 1.0 cm LEFT ATRIUM           Index LA diam:      3.40 cm 2.13 cm/m LA Vol (A2C): 27.0 ml 16.89 ml/m LA Vol (A4C): 38.4 ml 24.02 ml/m  AORTIC VALVE LVOT Vmax:   122.00 cm/s LVOT Vmean:  81.900 cm/s LVOT VTI:    0.252 m  AORTA Ao Root diam: 2.80 cm Ao Asc diam:  2.80 cm  SHUNTS Systemic VTI:  0.25 m Systemic Diam: 1.70 cm  Fransico Him MD Electronically signed by Fransico Him MD Signature Date/Time: 07/15/2019/3:59:04 PM    Final    CT Maxillofacial Wo Contrast  Result Date: 07/15/2019 CLINICAL DATA:  Seizure, nontraumatic, facial trauma. EXAM: CT  HEAD WITHOUT CONTRAST CT MAXILLOFACIAL WITHOUT  CONTRAST CT CERVICAL SPINE WITHOUT CONTRAST CT ABDOMEN AND PELVIS WITHOUT CONTRAST TECHNIQUE: Contiguous axial images were obtained from the base of the skull through the vertex without intravenous contrast. Multidetector CT imaging of the maxillofacial structures was performed. Multiplanar CT image reconstructions were also generated. A small metallic BB was placed on the right temple in order to reliably differentiate right from left. Multidetector CT imaging of the cervical spine was performed without intravenous contrast. Multiplanar CT image reconstructions were also generated. COMPARISON:  CT head and cervical spine 01/09/2015 FINDINGS: CT HEAD FINDINGS Brain: Since prior head CT 01/09/2015 there has been interval increase in size of a partially calcified meningioma with both hyperdense soft tissue components and calcified components. This now measures 2.1 x 1.6 x 2.4 cm (previously 1.5 cm). There is small volume acute subarachnoid hemorrhage along the anterior left frontal lobe, adjacent to meningioma. A small mixed density left subdural collection is also questioned overlying the anterior left frontal lobe, measuring 5 mm in thickness (series 3, image 14). Additional scattered supratentorial small volume acute subarachnoid hemorrhage, most notably along the lateral right temporal lobe (series 3, image 13). Abnormal cortical/subcortical hypodensity throughout much of the left PCA vascular territory within the left temporal and occipital lobes consistent with infarct, likely early subacute. Associated curvilinear hyperdensity at this site may reflect petechial hemorrhage or additional small volume acute subarachnoid hemorrhage. Redemonstrated small chronic focus of encephalomalacia within the anterolateral right frontal lobe. No midline shift. Ill-defined hypoattenuation within the cerebral white matter is nonspecific, but consistent with chronic small vessel ischemic disease. Moderate generalized parenchymal  atrophy. Calcifications redemonstrated within the basal ganglia and cerebellar white matter. Vascular: No definite hyperdense vessel. Atherosclerotic calcifications. Skull: Normal. Negative for fracture or focal lesion. Other: Right mastoid effusion. Redemonstrated left suboccipital scalp lesion measuring 2.4 cm, likely benign CT MAXILLOFACIAL FINDINGS Osseous: Non-displaced fracture of the left nasal bone/frontal process of maxilla. No other maxillofacial fracture is identified. Orbits: No acute abnormality. Sinuses: Mild ethmoid sinus mucosal thickening. Soft tissues: Swelling of the left frontotemporal scalp, forehead and left maxillofacial soft tissues. CT CERVICAL SPINE FINDINGS Alignment: Reversal of the expected cervical lordosis. No significant spondylolisthesis. Skull base and vertebrae: The basion-dental and atlanto-dental intervals are maintained.No evidence of acute fracture to the cervical spine. Soft tissues and spinal canal: No prevertebral fluid or swelling. No visible canal hematoma. Atherosclerotic disease. Disc levels: Congenital nonunion of the posterior arch of C1. Redemonstrated severe cervical spondylosis with multilevel disc degeneration, posterior disc osteophytes, uncovertebral and facet hypertrophy. Redemonstrated degenerative fusion across the C5-C6 disc space and of the right sided articular pillars at this level. There may also be fusion across the C6-C7 disc space. Degenerative fusion of the C7 and T1 articular pillars on the right. Upper chest: Imaged lung apices clear.  No visible pneumothorax. These results were called by telephone at the time of interpretation on 07/15/2019 at 2:03 pm to provider [facility] , who verbally acknowledged these results. IMPRESSION: CT head: 1. A partially calcified left frontal meningioma has increased in size since head CT 01/09/2015, now 2.1 cm. 2. Small volume acute subarachnoid hemorrhage along the anterior left frontal lobe, adjacent to the  meningioma. Additional scattered small volume acute supratentorial subarachnoid hemorrhage, greatest along the lateral right temporal lobe. 3. A small mixed density subdural hematoma is also questioned overlying the anterior left frontal lobe, 5 mm in thickness. 4. Infarction change throughout much of the left PCA vascular territory, likely early subacute.  Associated gyriform hyperdensity at this site may reflect petechial hemorrhage or additional small volume acute subarachnoid hemorrhage. 5. Chronic encephalomalacia within the anterolateral right frontal lobe. 6. Generalized parenchymal atrophy and chronic small vessel ischemic disease. 7. Right mastoid effusion. CT maxillofacial: 1. Nondisplaced fracture of the left nasal bone/frontal process of maxilla. 2. Left frontotemporal scalp, forehead and left maxillofacial soft tissue swelling. 3. Mild ethmoid sinus mucosal thickening. CT cervical spine: 1. No evidence of acute fracture to the cervical spine. 2. Redemonstrated advanced cervical spondylosis with multiple sites of degenerative fusion, as detailed. Electronically Signed   By: Kellie Simmering DO   On: 07/15/2019 14:14   Laurey Morale, MSN, NP-C [facility] [phone]  07/15/2019, 5:13 PM   Attending physician note to follow with Assessment and plan .   Assessment: Amber Fowler is an 85 y.o. female  With PMH HTN, HLD, dementia ( advanced baseline non-verbal), chronic ambulatory dysfunction chair bound who came to hospital after an unwitnessed fall. Neurology consulted for CVA and hemorrhage on CT.   Discussed with son, and he decided that he did not want to pursue further stroke work up at this time. He wants comfort care/ Hospice care. Patient Not candidate for anticoagulation or ASA at this time d/t traumatic SAH.  Stroke Risk Factors - hyperlipidemia and hypertension    Recommendations: -- IVF -- no further recommendations at this time.   --please page stroke NP  Or   PA  Or MD from 8am -4 pm  as this patient from this time will be  followed by the stroke.   You can look them up on [email or web address]  Password [email or web address]   NEUROHOSPITALIST ADDENDUM Performed a face to face diagnostic evaluation.   I have reviewed the contents of history and physical exam as documented by PA/ARNP/Resident and agree with above documentation.  I have discussed and formulated the above plan as documented. Edits to the note have been made as needed.   Impression : subacute left PCA stroke Etiology likely atheroembolic versus cardioembolic.  However after discussion with son, given her poor baseline, he wants patient to be comfort care/hospice.  Neurology will be available as needed.      Karena Addison Yanira Tolsma MD [facility] [phone]   If 7pm to 7am, please call on call as listed on AMION.

## 2019-07-15 NOTE — Telephone Encounter (Signed)
Hospital liaison team and Palliative NP updated on patient in ED.

## 2019-07-15 NOTE — Progress Notes (Signed)
Case was discussed with neurology, given the complexity of patient's medical conditions including acute intracranial bleeding and subacute stroke on top of patient's multiple comorbidities especially advanced dementia, neurology recommend conservative management.  After discussion with neurology, patient's son made the decision of pursuing hospice.  Disussed with hospice liaison NP Guinevere Ferrari over the phone, who plans to send hospice liaison to examine patient tomorrow.  Also discussed with ED case manager. Called pt's son and updated him with all the information, all questions answered.

## 2019-07-15 NOTE — Progress Notes (Signed)
Spoke with Carlyon Shadow from [facility] she would like MD to call and give updates on patient care and progress. May ask to speak with Carlyon Shadow or Dottie. Arthor Captain LPN

## 2019-07-16 DIAGNOSIS — S065X0A Traumatic subdural hemorrhage without loss of consciousness, initial encounter: Secondary | ICD-10-CM

## 2019-07-16 DIAGNOSIS — I609 Nontraumatic subarachnoid hemorrhage, unspecified: Secondary | ICD-10-CM

## 2019-07-16 DIAGNOSIS — I63332 Cerebral infarction due to thrombosis of left posterior cerebral artery: Secondary | ICD-10-CM

## 2019-07-16 DIAGNOSIS — S022XXA Fracture of nasal bones, initial encounter for closed fracture: Secondary | ICD-10-CM

## 2019-07-16 DIAGNOSIS — S42101A Fracture of unspecified part of scapula, right shoulder, initial encounter for closed fracture: Secondary | ICD-10-CM

## 2019-07-16 LAB — LIPID PANEL
Cholesterol: 197 mg/dL (ref 0–200)
HDL: 43 mg/dL (ref >40)
LDL Cholesterol: 147 mg/dL — ABNORMAL HIGH (ref 0–99)
Total CHOL/HDL Ratio: 4.6 RATIO
Triglycerides: 34 mg/dL (ref <150)
VLDL: 7 mg/dL (ref 0–40)

## 2019-07-16 LAB — HEMOGLOBIN A1C
Hgb A1c MFr Bld: 5.5 % (ref 4.8–5.6)
Mean Plasma Glucose: 111.15 mg/dL

## 2019-07-16 LAB — SARS CORONAVIRUS 2 (TAT 6-24 HRS): SARS Coronavirus 2: NEGATIVE

## 2019-07-16 LAB — MRSA PCR SCREENING: MRSA by PCR: NEGATIVE

## 2019-07-16 MED ORDER — CHLORHEXIDINE GLUCONATE CLOTH 2 % EX PADS
6.0000 | MEDICATED_PAD | Freq: Every day | CUTANEOUS | Status: DC
  Administered 2019-07-16 – 2019-07-18 (×3): 6 via TOPICAL

## 2019-07-16 NOTE — Progress Notes (Signed)
Nutrition Brief Note  Patient identified on the Malnutrition Screening Tool (MST) Report  Chart reviewed. Pt now transitioning to comfort care. Patient evaluated by SLP and able to eat and drink therefore has diet order.   No further nutrition interventions warranted at this time. Please consult as needed.   Lajuan Lines, RD, LDN Clinical Nutrition [place] Telephone [phone] After Hours/Weekend Pager: [phone]

## 2019-07-16 NOTE — Progress Notes (Signed)
Manufacturing engineer [facility]) RN note @12 :5 PM  RN spoke with pt's son Jeanann Lewandowsky) today and further educated on the different levels of care with Hospice services. Residential [facility] movement psychotherapist), Home with Hospice and discharge to the facility ([facility]) with Hospice services.  Son very appreciative and grateful for the information. Son states pt is doing better today from last evening and not quit sure what her disposition position will be prior to her discharge. Epic note have indicated residential hospice earlier today.    Son aware to contact Hospice directly with any questions or concerns.   Hospice will continue to follow concerning discharge disposition.   Thank You.  Raina Mina, RN, BSN [facility] Liaison [phone]  [place] are on [place].

## 2019-07-16 NOTE — Evaluation (Signed)
Clinical/Bedside Swallow Evaluation Patient Details  Name: Amber Fowler MRN: [id number] Date of Birth: Feb 09, 1919  Today's Date: 07/16/2019 Time: SLP Start Time (ACUTE ONLY): 0910 SLP Stop Time (ACUTE ONLY): 0930 SLP Time Calculation (min) (ACUTE ONLY): 20 min  Past Medical History:  Past Medical History:  Diagnosis Date  . Alzheimer disease ([place])    [name] 01/09/2015  . Anemia   . Cancer of right breast ([place])   . Depression   . Glaucoma   . Hyperlipidemia   . Hypertension   . Hypothyroid   . Lumbar stenosis   . Osteoarthritis   . Osteoarthritis   . Subdural hematoma ([place])   . Thyroid disease   . Vitamin E deficiency    Past Surgical History:  Past Surgical History:  Procedure Laterality Date  . HIP ARTHROPLASTY Left 01/10/2015   Procedure: ARTHROPLASTY BIPOLAR HIP (HEMIARTHROPLASTY);  Surgeon: [name], MD;  Location: [place];  Service: Orthopedics;  Laterality: Left;  [name] MASTECTOMY Right 2003   [name] 10/01/2010  . ORIF HUMERUS FRACTURE Left 01/10/2015   Procedure: OPEN REDUCTION INTERNAL FIXATION (ORIF) HUMERAL SHAFT FRACTURE;  Surgeon: [name], MD;  Location: [place];  Service: Orthopedics;  Laterality: Left;   HPI:  [name] is a 85 y.o. female with medical history significant of hypertension advanced dementia (baseline nonverbal), chronic ambulatory dysfunction/chair bound, sent from home by her caregiver/24-hour home care nurse, after nosebleeding probably from a unwitnessed fall.  EMS noticed patient has a left forehead hematoma.  According to home care nurse, and has been weaker over the last 2 to 3 days, and has been lethargic. Head CT on 12/18 with multiple finding including 1. "A partially calcified left frontal meningioma" 2. "Small volume acute subarachnoid hemorrhage along the anterior left frontal lobe, adjacent to the meningioma. Additional scattered small volume acute supratentorial subarachnoid hemorrhage, greatest along the lateral  right temporal lobe." 3. "A small mixed density subdural hematoma is also questioned overlying the anterior left frontal lobe" 4. "Infarction change throughout much of the left PCA vascular territory, likely early subacute."  Pt with hx of dysphagia with most recent recommendations for Dysphagia 1 (puree) solids and thin liquid.     Assessment / Plan / Recommendation Clinical Impression  Pt was encountered asleep in bed; however, she roused to minimal verbal stimulation.  Pt's eyes remained closed throughout this evaluation despite cues.  Pt was observed to have frequent vocalizations that were mostly unintelligible; however, she had a few perseverative phrases such as "Alright, okay".   Pt was observed with trials of ice chips, thin liquid, and puree.  She exhibited good bolus acceptance with independent labial closure around the spoon and straw.  She additionally presented with timely AP transport with all trials.  Suspect possible delayed swallow initiation; however, pt with consistent positive hyolaryngeal elevation/excursion via observation and palpation and no clinical s/sx of aspiration were observed with any trials.  Recommend iniation of Dysphagia 1 (puree) solids and thin liquid with the following precautions: 1) Small bites/sips 2) Slow rate of intake 3) Sit upright as possible.  Pt will benefit from full supervision to assist with po intake and to cue for compensatory strategies.  Due to hx of advanced dementia, will defer Cognitive/Language evaluation at this time.  SLP will f/u to monitor diet tolerance per POC.  SLP Visit Diagnosis: Dysphagia, unspecified (R13.10)    Aspiration Risk  Mild aspiration risk    Diet Recommendation Thin liquid;Dysphagia 1 (Puree)   Liquid  Administration via: Straw;Spoon Medication Administration: Crushed with puree Supervision: Full supervision/cueing for compensatory strategies Compensations: Slow rate;Small sips/bites;Minimize environmental  distractions Postural Changes: Seated upright at 90 degrees    Other  Recommendations Oral Care Recommendations: Oral care BID;Staff/trained caregiver to provide oral care   Follow up Recommendations Skilled Nursing facility;24 hour supervision/assistance      Frequency and Duration min 2x/week  2 weeks       Prognosis Prognosis for Safe Diet Advancement: Fair Barriers to Reach Goals: Cognitive deficits      Swallow Study   General HPI: Amber Fowler is a 85 y.o. female with medical history significant of hypertension advanced dementia (baseline nonverbal), chronic ambulatory dysfunction/chair bound, sent from home by her caregiver/24-hour home care nurse, after nosebleeding probably from a unwitnessed fall.  EMS noticed patient has a left forehead hematoma.  According to home care nurse, and has been weaker over the last 2 to 3 days, and has been lethargic. Head CT on 12/18 with multiple finding including 1. "A partially calcified left frontal meningioma", 2. "Small volume acute subarachnoid hemorrhage along the anterior left frontal lobe, adjacent to the meningioma. Additional scattered small volume acute supratentorial subarachnoid hemorrhage, greatest along the lateral right temporal lobe." 3. "A small mixed density subdural hematoma is also questioned overlying the anterior left frontal lobe" 4. "Infarction change throughout much of the left PCA vascular territory, likely early subacute."  Pt with hx of dysphagia with most recent recommendations for Dysphagia 1 (puree) solids and thin liquid.   Type of Study: Bedside Swallow Evaluation Previous Swallow Assessment: BSE on 01/13/2015 Diet Prior to this Study: NPO Temperature Spikes Noted: No Respiratory Status: Room air History of Recent Intubation: No Behavior/Cognition: Alert;Pleasant mood;Confused;Requires cueing Oral Cavity Assessment: Dry Oral Care Completed by SLP: No Oral Cavity - Dentition: Edentulous Self-Feeding  Abilities: Total assist Patient Positioning: Upright in bed Baseline Vocal Quality: Normal Volitional Cough: Cognitively unable to elicit Volitional Swallow: Able to elicit    Oral/Motor/Sensory Function Overall Oral Motor/Sensory Function: (Unable to evaluate secondary to cognition )   Ice Chips Ice chips: Within functional limits Presentation: Spoon   Thin Liquid Thin Liquid: Impaired Presentation: Spoon;Straw Pharyngeal  Phase Impairments: Suspected delayed Swallow    Nectar Thick Nectar Thick Liquid: Not tested   Honey Thick Honey Thick Liquid: Not tested   Puree Puree: Impaired Presentation: Spoon Oral Phase Functional Implications: Prolonged oral transit   Solid     Solid: Not tested     Colin Mulders M.S., CCC-SLP Acute Rehabilitation Services Office: [phone]  Elvia Collum Breton Berns 07/16/2019,9:47 AM

## 2019-07-16 NOTE — Progress Notes (Signed)
OT Cancellation Note  Patient Details Name: Amber Fowler MRN: [id number] DOB: 01-Jul-1919   Cancelled Treatment:    Reason Eval/Treat Not Completed: Other (comment)(Pt's family deciding on hospice options/dispo. OT to follow.) OTR to continue to follow as appropriate.  Jefferey Pica OTR/L Acute Rehabilitation Services Pager: [phone] Office: [phone]   Jefferey Pica 07/16/2019, 3:12 PM

## 2019-07-16 NOTE — Progress Notes (Signed)
PT Cancellation Note  Patient Details Name: Amber Fowler MRN: [id number] DOB: 1919/02/19   Cancelled Treatment:      Reason Eval/Treat Not Completed: Other (comment)(Pt's family deciding on hospice options/dispo. PT to follow.)PT to continue to follow as appropriate.    Carney Living PT DPT  07/16/2019, 4:33 PM

## 2019-07-16 NOTE — Progress Notes (Signed)
PROGRESS NOTE    Amber Fowler   [id number]  DOB: 1919-07-07  DOA: 07/15/2019 PCP: Alanson Puls[facility]   Brief Narrative:  Amber Fowler  is a 85 y.o. female with medical history significant of hypertension advanced dementia (baseline nonverbal), chronic ambulatory dysfunction/chair bound, sent from [place] facility by her caregiver after being found with nose bleed and hematoma on her forehead probably from a unwitnessed fall.  EMS noticed patient has a left forehead hematoma.  According to home care nurse, and has been weaker over the last 2 to 3 days, and has been lethargic.  In the ED: CT of the head showed a subacute left PCA stroke, small volume acute subarachnoid hemorrhage along the anterior left frontal lobe, additional scattered small volume acute supratentorial subarachnoid hemorrhage greatest along the lateral right temporal lobe, small mixed density subdural hematoma also questioned overlying the anterior left lobe, 5 mm in thickness.  Partially calcified left frontal meningioma increased in size since 01/09/2015 to 2.1 cm.  Chronic encephalomalacia with anterior lateral right frontal lobe Neurology consult was requested part was likely atheroembolic versus cardioembolic.  The family decided to make the patient DO NOT RESUSCITATE and planned to place her under hospice care.  Subjective: Patient is awake and verbal but speech is completely nonsensical.  She does not open her eyes.  She follows some commands.    Assessment & Plan:   Active Problems:   SAH (subarachnoid hemorrhage)    Subdural hematoma due to concussion    Stroke (cerebrum)   -As mentioned above, she is comfort care-speech therapy has evaluated her and the patient is able to eat and drink and therefore diet has been ordered -We are waiting for hospice/palliative care liaison to further discuss disposition and plan with family  Dementia with superimposed acute confusion related to above neurological  injuries -The patient is quite confused and speech does not make any sense-follow for behavioral disturbances-at this time she does not appear to require a sitter  Nondisplaced fractures of left nasal bone/frontal process of maxilla -This is likely a result of the fall and also the cause of her nosebleed which has stopped -Conservative management  Hypertension -Due to acute CVA, I will allow for permissive hypertension   Time spent in minutes: 35 DVT prophylaxis: SCDs Code Status: DO NOT RESUSCITATE Family Communication:  Disposition Plan: To be determined Consultants:   Neurology  Awaiting palliative care consult Procedures:    Antimicrobials:  Anti-infectives (From admission, onward)   None       Objective: Vitals:   07/16/19 0045 07/16/19 0200 07/16/19 0400 07/16/19 0801  BP: (!) 152/103 (!) 153/80 (!) 153/80 (!) 197/81  Pulse: 75 69  80  Resp: 18 16 16 16   Temp: 98.5 F (36.9 C) 98.7 F (37.1 C) 98.7 F (37.1 C) 98.1 F (36.7 C)  TempSrc: Oral Axillary Axillary Oral  SpO2: 99% 99% 99% 100%   No intake or output data in the 24 hours ending 07/16/19 1107 There were no vitals filed for this visit.  Examination: General exam: Appears comfortable  HEENT: PERRLA, oral mucosa moist, no sclera icterus or thrush Respiratory system: Clear to auscultation. Respiratory effort normal. Cardiovascular system: S1 & S2 heard, RRR.   Gastrointestinal system: Abdomen soft, non-tender, nondistended. Normal bowel sounds. Central nervous system: Alert -follow some commands but does not cooperate with neurological exam Extremities: No cyanosis, clubbing or edema Skin: No rashes or ulcers Psychiatry: Severely confused    Data Reviewed: I have  personally reviewed following labs and imaging studies  CBC: Recent Labs  Lab 07/15/19 1250 07/15/19 1335  WBC  --  10.2  NEUTROABS  --  8.0*  HGB 15.3* 13.8  HCT 45.0 43.5  MCV  --  96.2  PLT  --  99991111   Basic Metabolic  Panel: Recent Labs  Lab 07/15/19 1250 07/15/19 1335  NA 143 142  K 5.4* 4.3  CL 107 108  CO2  --  24  GLUCOSE 124* 106*  BUN 21 12  CREATININE 0.70 0.68  CALCIUM  --  9.2  MG  --  2.0   GFR: CrCl cannot be calculated (Unknown ideal weight.). Liver Function Tests: Recent Labs  Lab 07/15/19 1335  AST 30  ALT 10  ALKPHOS 51  BILITOT 0.8  PROT 7.2  ALBUMIN 3.3*   No results for input(s): LIPASE, AMYLASE in the last 168 hours. No results for input(s): AMMONIA in the last 168 hours. Coagulation Profile: Recent Labs  Lab 07/15/19 1335  INR 1.0   Cardiac Enzymes: No results for input(s): CKTOTAL, CKMB, CKMBINDEX, TROPONINI in the last 168 hours. BNP (last 3 results) No results for input(s): PROBNP in the last 8760 hours. HbA1C: Recent Labs    07/16/19 0253  HGBA1C 5.5   CBG: Recent Labs  Lab 07/15/19 1230  GLUCAP 111*   Lipid Profile: Recent Labs    07/16/19 0253  CHOL 197  HDL 43  LDLCALC 147*  TRIG 34  CHOLHDL 4.6   Thyroid Function Tests: No results for input(s): TSH, T4TOTAL, FREET4, T3FREE, THYROIDAB in the last 72 hours. Anemia Panel: No results for input(s): VITAMINB12, FOLATE, FERRITIN, TIBC, IRON, RETICCTPCT in the last 72 hours. Urine analysis:    Component Value Date/Time   COLORURINE YELLOW 07/15/2019 2147   APPEARANCEUR CLOUDY (A) 07/15/2019 2147   LABSPEC 1.015 07/15/2019 2147   PHURINE 6.0 07/15/2019 2147   GLUCOSEU NEGATIVE 07/15/2019 2147   HGBUR SMALL (A) 07/15/2019 2147   BILIRUBINUR NEGATIVE 07/15/2019 2147   KETONESUR 20 (A) 07/15/2019 2147   PROTEINUR 30 (A) 07/15/2019 2147   UROBILINOGEN 0.2 01/09/2015 1150   NITRITE NEGATIVE 07/15/2019 2147   LEUKOCYTESUR NEGATIVE 07/15/2019 2147   Sepsis Labs: @LABRCNTIP (procalcitonin:4,lacticidven:4) ) Recent Results (from the past 240 hour(s))  SARS CORONAVIRUS 2 (TAT 6-24 HRS) Nasopharyngeal Nasopharyngeal Swab     Status: None   Collection Time: 07/15/19  8:36 PM   Specimen:  Nasopharyngeal Swab  Result Value Ref Range Status   SARS Coronavirus 2 NEGATIVE NEGATIVE Final    Comment: (NOTE) SARS-CoV-2 target nucleic acids are NOT DETECTED. The SARS-CoV-2 RNA is generally detectable in upper and lower respiratory specimens during the acute phase of infection. Negative results do not preclude SARS-CoV-2 infection, do not rule out co-infections with other pathogens, and should not be used as the sole basis for treatment or other patient management decisions. Negative results must be combined with clinical observations, patient history, and epidemiological information. The expected result is Negative. Fact Sheet for Patients: SugarRoll.be Fact Sheet for Healthcare Providers: https://www.woods-mathews.com/ This test is not yet approved or cleared by the [place] FDA and  has been authorized for detection and/or diagnosis of SARS-CoV-2 by FDA under an Emergency Use Authorization (EUA). This EUA will remain  in effect (meaning this test can be used) for the duration of the COVID-19 declaration under Section 56 4(b)(1) of the Act, 21 U.S.C. section 360bbb-3(b)(1), unless the authorization is terminated or revoked sooner. Performed at [facility],  [address], [place], [place] [address]   MRSA PCR Screening     Status: None   Collection Time: 07/15/19 10:13 PM   Specimen: Nasal Mucosa; Nasopharyngeal  Result Value Ref Range Status   MRSA by PCR NEGATIVE NEGATIVE Final    Comment:        The GeneXpert MRSA Assay (FDA approved for NASAL specimens only), is one component of a comprehensive MRSA colonization surveillance program. It is not intended to diagnose MRSA infection nor to guide or monitor treatment for MRSA infections. Performed at [facility], [place] [address], [place], [place] [address]          Radiology Studies: DG Pelvis 1-2 Views  Result Date: 07/15/2019 CLINICAL DATA:   Seizure.  Nonverbal. EXAM: PELVIS - 1-2 VIEW COMPARISON:  01/10/2015 FINDINGS: The right hip prosthesis is normally located. No complicating features. The right hip is also normally located. No right hip fracture. The pubic symphysis and SI joints are intact. No definite pelvic fractures. IMPRESSION: No acute bony findings. Electronically Signed   By: Marijo Sanes M.D.   On: 07/15/2019 12:35   CT HEAD WO CONTRAST  Result Date: 07/15/2019 CLINICAL DATA:  Seizure, nontraumatic, facial trauma. EXAM: CT HEAD WITHOUT CONTRAST CT MAXILLOFACIAL WITHOUT CONTRAST CT CERVICAL SPINE WITHOUT CONTRAST CT ABDOMEN AND PELVIS WITHOUT CONTRAST TECHNIQUE: Contiguous axial images were obtained from the base of the skull through the vertex without intravenous contrast. Multidetector CT imaging of the maxillofacial structures was performed. Multiplanar CT image reconstructions were also generated. A small metallic BB was placed on the right temple in order to reliably differentiate right from left. Multidetector CT imaging of the cervical spine was performed without intravenous contrast. Multiplanar CT image reconstructions were also generated. COMPARISON:  CT head and cervical spine 01/09/2015 FINDINGS: CT HEAD FINDINGS Brain: Since prior head CT 01/09/2015 there has been interval increase in size of a partially calcified meningioma with both hyperdense soft tissue components and calcified components. This now measures 2.1 x 1.6 x 2.4 cm (previously 1.5 cm). There is small volume acute subarachnoid hemorrhage along the anterior left frontal lobe, adjacent to meningioma. A small mixed density left subdural collection is also questioned overlying the anterior left frontal lobe, measuring 5 mm in thickness (series 3, image 14). Additional scattered supratentorial small volume acute subarachnoid hemorrhage, most notably along the lateral right temporal lobe (series 3, image 13). Abnormal cortical/subcortical hypodensity throughout  much of the left PCA vascular territory within the left temporal and occipital lobes consistent with infarct, likely early subacute. Associated curvilinear hyperdensity at this site may reflect petechial hemorrhage or additional small volume acute subarachnoid hemorrhage. Redemonstrated small chronic focus of encephalomalacia within the anterolateral right frontal lobe. No midline shift. Ill-defined hypoattenuation within the cerebral white matter is nonspecific, but consistent with chronic small vessel ischemic disease. Moderate generalized parenchymal atrophy. Calcifications redemonstrated within the basal ganglia and cerebellar white matter. Vascular: No definite hyperdense vessel. Atherosclerotic calcifications. Skull: Normal. Negative for fracture or focal lesion. Other: Right mastoid effusion. Redemonstrated left suboccipital scalp lesion measuring 2.4 cm, likely benign CT MAXILLOFACIAL FINDINGS Osseous: Non-displaced fracture of the left nasal bone/frontal process of maxilla. No other maxillofacial fracture is identified. Orbits: No acute abnormality. Sinuses: Mild ethmoid sinus mucosal thickening. Soft tissues: Swelling of the left frontotemporal scalp, forehead and left maxillofacial soft tissues. CT CERVICAL SPINE FINDINGS Alignment: Reversal of the expected cervical lordosis. No significant spondylolisthesis. Skull base and vertebrae: The basion-dental and atlanto-dental intervals are maintained.No evidence  of acute fracture to the cervical spine. Soft tissues and spinal canal: No prevertebral fluid or swelling. No visible canal hematoma. Atherosclerotic disease. Disc levels: Congenital nonunion of the posterior arch of C1. Redemonstrated severe cervical spondylosis with multilevel disc degeneration, posterior disc osteophytes, uncovertebral and facet hypertrophy. Redemonstrated degenerative fusion across the C5-C6 disc space and of the right sided articular pillars at this level. There may also be fusion  across the C6-C7 disc space. Degenerative fusion of the C7 and T1 articular pillars on the right. Upper chest: Imaged lung apices clear.  No visible pneumothorax. These results were called by telephone at the time of interpretation on 07/15/2019 at 2:03 pm to provider [facility] , who verbally acknowledged these results. IMPRESSION: CT head: 1. A partially calcified left frontal meningioma has increased in size since head CT 01/09/2015, now 2.1 cm. 2. Small volume acute subarachnoid hemorrhage along the anterior left frontal lobe, adjacent to the meningioma. Additional scattered small volume acute supratentorial subarachnoid hemorrhage, greatest along the lateral right temporal lobe. 3. A small mixed density subdural hematoma is also questioned overlying the anterior left frontal lobe, 5 mm in thickness. 4. Infarction change throughout much of the left PCA vascular territory, likely early subacute. Associated gyriform hyperdensity at this site may reflect petechial hemorrhage or additional small volume acute subarachnoid hemorrhage. 5. Chronic encephalomalacia within the anterolateral right frontal lobe. 6. Generalized parenchymal atrophy and chronic small vessel ischemic disease. 7. Right mastoid effusion. CT maxillofacial: 1. Nondisplaced fracture of the left nasal bone/frontal process of maxilla. 2. Left frontotemporal scalp, forehead and left maxillofacial soft tissue swelling. 3. Mild ethmoid sinus mucosal thickening. CT cervical spine: 1. No evidence of acute fracture to the cervical spine. 2. Redemonstrated advanced cervical spondylosis with multiple sites of degenerative fusion, as detailed. Electronically Signed   By: Kellie Simmering DO   On: 07/15/2019 14:14   CT CERVICAL SPINE WO CONTRAST  Result Date: 07/15/2019 CLINICAL DATA:  Seizure, nontraumatic, facial trauma. EXAM: CT HEAD WITHOUT CONTRAST CT MAXILLOFACIAL WITHOUT CONTRAST CT CERVICAL SPINE WITHOUT CONTRAST CT ABDOMEN AND PELVIS WITHOUT  CONTRAST TECHNIQUE: Contiguous axial images were obtained from the base of the skull through the vertex without intravenous contrast. Multidetector CT imaging of the maxillofacial structures was performed. Multiplanar CT image reconstructions were also generated. A small metallic BB was placed on the right temple in order to reliably differentiate right from left. Multidetector CT imaging of the cervical spine was performed without intravenous contrast. Multiplanar CT image reconstructions were also generated. COMPARISON:  CT head and cervical spine 01/09/2015 FINDINGS: CT HEAD FINDINGS Brain: Since prior head CT 01/09/2015 there has been interval increase in size of a partially calcified meningioma with both hyperdense soft tissue components and calcified components. This now measures 2.1 x 1.6 x 2.4 cm (previously 1.5 cm). There is small volume acute subarachnoid hemorrhage along the anterior left frontal lobe, adjacent to meningioma. A small mixed density left subdural collection is also questioned overlying the anterior left frontal lobe, measuring 5 mm in thickness (series 3, image 14). Additional scattered supratentorial small volume acute subarachnoid hemorrhage, most notably along the lateral right temporal lobe (series 3, image 13). Abnormal cortical/subcortical hypodensity throughout much of the left PCA vascular territory within the left temporal and occipital lobes consistent with infarct, likely early subacute. Associated curvilinear hyperdensity at this site may reflect petechial hemorrhage or additional small volume acute subarachnoid hemorrhage. Redemonstrated small chronic focus of encephalomalacia within the anterolateral right frontal lobe. No midline shift. Ill-defined  hypoattenuation within the cerebral white matter is nonspecific, but consistent with chronic small vessel ischemic disease. Moderate generalized parenchymal atrophy. Calcifications redemonstrated within the basal ganglia and  cerebellar white matter. Vascular: No definite hyperdense vessel. Atherosclerotic calcifications. Skull: Normal. Negative for fracture or focal lesion. Other: Right mastoid effusion. Redemonstrated left suboccipital scalp lesion measuring 2.4 cm, likely benign CT MAXILLOFACIAL FINDINGS Osseous: Non-displaced fracture of the left nasal bone/frontal process of maxilla. No other maxillofacial fracture is identified. Orbits: No acute abnormality. Sinuses: Mild ethmoid sinus mucosal thickening. Soft tissues: Swelling of the left frontotemporal scalp, forehead and left maxillofacial soft tissues. CT CERVICAL SPINE FINDINGS Alignment: Reversal of the expected cervical lordosis. No significant spondylolisthesis. Skull base and vertebrae: The basion-dental and atlanto-dental intervals are maintained.No evidence of acute fracture to the cervical spine. Soft tissues and spinal canal: No prevertebral fluid or swelling. No visible canal hematoma. Atherosclerotic disease. Disc levels: Congenital nonunion of the posterior arch of C1. Redemonstrated severe cervical spondylosis with multilevel disc degeneration, posterior disc osteophytes, uncovertebral and facet hypertrophy. Redemonstrated degenerative fusion across the C5-C6 disc space and of the right sided articular pillars at this level. There may also be fusion across the C6-C7 disc space. Degenerative fusion of the C7 and T1 articular pillars on the right. Upper chest: Imaged lung apices clear.  No visible pneumothorax. These results were called by telephone at the time of interpretation on 07/15/2019 at 2:03 pm to provider [facility] , who verbally acknowledged these results. IMPRESSION: CT head: 1. A partially calcified left frontal meningioma has increased in size since head CT 01/09/2015, now 2.1 cm. 2. Small volume acute subarachnoid hemorrhage along the anterior left frontal lobe, adjacent to the meningioma. Additional scattered small volume acute supratentorial  subarachnoid hemorrhage, greatest along the lateral right temporal lobe. 3. A small mixed density subdural hematoma is also questioned overlying the anterior left frontal lobe, 5 mm in thickness. 4. Infarction change throughout much of the left PCA vascular territory, likely early subacute. Associated gyriform hyperdensity at this site may reflect petechial hemorrhage or additional small volume acute subarachnoid hemorrhage. 5. Chronic encephalomalacia within the anterolateral right frontal lobe. 6. Generalized parenchymal atrophy and chronic small vessel ischemic disease. 7. Right mastoid effusion. CT maxillofacial: 1. Nondisplaced fracture of the left nasal bone/frontal process of maxilla. 2. Left frontotemporal scalp, forehead and left maxillofacial soft tissue swelling. 3. Mild ethmoid sinus mucosal thickening. CT cervical spine: 1. No evidence of acute fracture to the cervical spine. 2. Redemonstrated advanced cervical spondylosis with multiple sites of degenerative fusion, as detailed. Electronically Signed   By: Kellie Simmering DO   On: 07/15/2019 14:14   DG Chest Port 1 View  Result Date: 07/15/2019 CLINICAL DATA:  Seizure EXAM: PORTABLE CHEST 1 VIEW COMPARISON:  03/15/2017 FINDINGS: Heart size appears within normal limits. Calcified thoracic aorta. Lung volumes are low. No focal airspace consolidation, pleural effusion, or pneumothorax. Osseous structures appear diffusely demineralized. Partially visualized ORIF hardware of the proximal left humerus. Severe degenerative changes of the shoulders. Linear lucency through the posterosuperior aspect of the right scapula suspicious for fracture. IMPRESSION: 1. Linear lucency through the posterosuperior aspect of the right scapula suspicious for fracture. 2. No acute cardiopulmonary findings. Electronically Signed   By: Davina Poke M.D.   On: 07/15/2019 12:35   ECHOCARDIOGRAM COMPLETE  Result Date: 07/15/2019   ECHOCARDIOGRAM REPORT   Patient Name:    Amber Fowler Date of Exam: 07/15/2019 Medical Rec #:  [id number]  Height:       66.0 in Accession #:    [id number]       Weight:       118.0 lb Date of Birth:  25-Oct-1918       BSA:          1.60 m Patient Age:    89 years         BP:           156/54 mmHg Patient Gender: F                HR:           71 bpm. Exam Location:  Inpatient Procedure: 2D Echo, Color Doppler and Cardiac Doppler Indications:    Stroke  History:        Patient has no prior history of Echocardiogram examinations.                 Stroke; Risk Factors:Hypertension and Dyslipidemia.  Sonographer:    Raquel Sarna Senior RDCS Referring Phys: [id number] [place]  1. Left ventricular ejection fraction, by visual estimation, is 65 to 70%. The left ventricle has normal function. Left ventricular septal wall thickness was severely increased. Severely increased left ventricular posterior wall thickness. There is no left ventricular hypertrophy.  2. Left ventricular diastolic parameters are consistent with Grade I diastolic dysfunction (impaired relaxation).  3. The left ventricle has no regional wall motion abnormalities.  4. Global right ventricle has normal systolic function.The right ventricular size is normal. No increase in right ventricular wall thickness.  5. Left atrial size was normal.  6. Right atrial size was normal.  7. Mild thickening of the anterior and posterior mitral valve leaflet(s).  8. Moderate mitral annular calcification.  9. The mitral valve is normal in structure. No evidence of mitral valve regurgitation. No evidence of mitral stenosis. 10. The tricuspid valve is normal in structure. Tricuspid valve regurgitation is not demonstrated. 11. The aortic valve is tricuspid. Aortic valve regurgitation is not visualized. No evidence of aortic valve sclerosis or stenosis. 12. The pulmonic valve was normal in structure. Pulmonic valve regurgitation is not visualized. 13. TR signal is inadequate for assessing pulmonary  artery systolic pressure. 14. The inferior vena cava is normal in size with greater than 50% respiratory variability, suggesting right atrial pressure of 3 mmHg. FINDINGS  Left Ventricle: Left ventricular ejection fraction, by visual estimation, is 65 to 70%. The left ventricle has normal function. The left ventricle has no regional wall motion abnormalities. Severely increased left ventricular posterior wall thickness. There is no left ventricular hypertrophy. Left ventricular diastolic parameters are consistent with Grade I diastolic dysfunction (impaired relaxation). Right Ventricle: The right ventricular size is normal. No increase in right ventricular wall thickness. Global RV systolic function is has normal systolic function. Left Atrium: Left atrial size was normal in size. Right Atrium: Right atrial size was normal in size Pericardium: There is no evidence of pericardial effusion. Mitral Valve: The mitral valve is normal in structure. There is mild thickening of the anterior and posterior mitral valve leaflet(s). Moderate mitral annular calcification. No evidence of mitral valve regurgitation. No evidence of mitral valve stenosis by observation. Tricuspid Valve: The tricuspid valve is normal in structure. Tricuspid valve regurgitation is not demonstrated. Aortic Valve: The aortic valve is tricuspid. . There is moderate thickening of the aortic valve. Aortic valve regurgitation is not visualized. The aortic valve is structurally normal, with no evidence of sclerosis or stenosis. There is  moderate thickening of the aortic valve. Pulmonic Valve: The pulmonic valve was normal in structure. Pulmonic valve regurgitation is not visualized. Pulmonic regurgitation is not visualized. Aorta: The aortic root, ascending aorta and aortic arch are all structurally normal, with no evidence of dilitation or obstruction. Venous: The inferior vena cava is normal in size with greater than 50% respiratory variability, suggesting  right atrial pressure of 3 mmHg. IAS/Shunts: No atrial level shunt detected by color flow Doppler. There is no evidence of a patent foramen ovale. No ventricular septal defect is seen or detected. There is no evidence of an atrial septal defect.  LEFT VENTRICLE PLAX 2D LVIDd:         2.09 cm LVIDs:         1.36 cm LV PW:         1.46 cm LV IVS:        1.55 cm LVOT diam:     1.70 cm LV SV:         10 ml LV SV Index:   6.08 LVOT Area:     2.27 cm  RIGHT VENTRICLE             IVC RV Basal diam:  1.95 cm     IVC diam: 0.65 cm RV S prime:     10.70 cm/s TAPSE (M-mode): 1.0 cm LEFT ATRIUM           Index LA diam:      3.40 cm 2.13 cm/m LA Vol (A2C): 27.0 ml 16.89 ml/m LA Vol (A4C): 38.4 ml 24.02 ml/m  AORTIC VALVE LVOT Vmax:   122.00 cm/s LVOT Vmean:  81.900 cm/s LVOT VTI:    0.252 m  AORTA Ao Root diam: 2.80 cm Ao Asc diam:  2.80 cm  SHUNTS Systemic VTI:  0.25 m Systemic Diam: 1.70 cm  Fransico Him MD Electronically signed by Fransico Him MD Signature Date/Time: 07/15/2019/3:59:04 PM    Final    CT Maxillofacial Wo Contrast  Result Date: 07/15/2019 CLINICAL DATA:  Seizure, nontraumatic, facial trauma. EXAM: CT HEAD WITHOUT CONTRAST CT MAXILLOFACIAL WITHOUT CONTRAST CT CERVICAL SPINE WITHOUT CONTRAST CT ABDOMEN AND PELVIS WITHOUT CONTRAST TECHNIQUE: Contiguous axial images were obtained from the base of the skull through the vertex without intravenous contrast. Multidetector CT imaging of the maxillofacial structures was performed. Multiplanar CT image reconstructions were also generated. A small metallic BB was placed on the right temple in order to reliably differentiate right from left. Multidetector CT imaging of the cervical spine was performed without intravenous contrast. Multiplanar CT image reconstructions were also generated. COMPARISON:  CT head and cervical spine 01/09/2015 FINDINGS: CT HEAD FINDINGS Brain: Since prior head CT 01/09/2015 there has been interval increase in size of a partially  calcified meningioma with both hyperdense soft tissue components and calcified components. This now measures 2.1 x 1.6 x 2.4 cm (previously 1.5 cm). There is small volume acute subarachnoid hemorrhage along the anterior left frontal lobe, adjacent to meningioma. A small mixed density left subdural collection is also questioned overlying the anterior left frontal lobe, measuring 5 mm in thickness (series 3, image 14). Additional scattered supratentorial small volume acute subarachnoid hemorrhage, most notably along the lateral right temporal lobe (series 3, image 13). Abnormal cortical/subcortical hypodensity throughout much of the left PCA vascular territory within the left temporal and occipital lobes consistent with infarct, likely early subacute. Associated curvilinear hyperdensity at this site may reflect petechial hemorrhage or additional small volume acute subarachnoid hemorrhage. Redemonstrated small chronic focus  of encephalomalacia within the anterolateral right frontal lobe. No midline shift. Ill-defined hypoattenuation within the cerebral white matter is nonspecific, but consistent with chronic small vessel ischemic disease. Moderate generalized parenchymal atrophy. Calcifications redemonstrated within the basal ganglia and cerebellar white matter. Vascular: No definite hyperdense vessel. Atherosclerotic calcifications. Skull: Normal. Negative for fracture or focal lesion. Other: Right mastoid effusion. Redemonstrated left suboccipital scalp lesion measuring 2.4 cm, likely benign CT MAXILLOFACIAL FINDINGS Osseous: Non-displaced fracture of the left nasal bone/frontal process of maxilla. No other maxillofacial fracture is identified. Orbits: No acute abnormality. Sinuses: Mild ethmoid sinus mucosal thickening. Soft tissues: Swelling of the left frontotemporal scalp, forehead and left maxillofacial soft tissues. CT CERVICAL SPINE FINDINGS Alignment: Reversal of the expected cervical lordosis. No significant  spondylolisthesis. Skull base and vertebrae: The basion-dental and atlanto-dental intervals are maintained.No evidence of acute fracture to the cervical spine. Soft tissues and spinal canal: No prevertebral fluid or swelling. No visible canal hematoma. Atherosclerotic disease. Disc levels: Congenital nonunion of the posterior arch of C1. Redemonstrated severe cervical spondylosis with multilevel disc degeneration, posterior disc osteophytes, uncovertebral and facet hypertrophy. Redemonstrated degenerative fusion across the C5-C6 disc space and of the right sided articular pillars at this level. There may also be fusion across the C6-C7 disc space. Degenerative fusion of the C7 and T1 articular pillars on the right. Upper chest: Imaged lung apices clear.  No visible pneumothorax. These results were called by telephone at the time of interpretation on 07/15/2019 at 2:03 pm to provider [facility] , who verbally acknowledged these results. IMPRESSION: CT head: 1. A partially calcified left frontal meningioma has increased in size since head CT 01/09/2015, now 2.1 cm. 2. Small volume acute subarachnoid hemorrhage along the anterior left frontal lobe, adjacent to the meningioma. Additional scattered small volume acute supratentorial subarachnoid hemorrhage, greatest along the lateral right temporal lobe. 3. A small mixed density subdural hematoma is also questioned overlying the anterior left frontal lobe, 5 mm in thickness. 4. Infarction change throughout much of the left PCA vascular territory, likely early subacute. Associated gyriform hyperdensity at this site may reflect petechial hemorrhage or additional small volume acute subarachnoid hemorrhage. 5. Chronic encephalomalacia within the anterolateral right frontal lobe. 6. Generalized parenchymal atrophy and chronic small vessel ischemic disease. 7. Right mastoid effusion. CT maxillofacial: 1. Nondisplaced fracture of the left nasal bone/frontal process of  maxilla. 2. Left frontotemporal scalp, forehead and left maxillofacial soft tissue swelling. 3. Mild ethmoid sinus mucosal thickening. CT cervical spine: 1. No evidence of acute fracture to the cervical spine. 2. Redemonstrated advanced cervical spondylosis with multiple sites of degenerative fusion, as detailed. Electronically Signed   By: Kellie Simmering DO   On: 07/15/2019 14:14      Scheduled Meds: .  stroke: mapping our early stages of recovery book   Does not apply Once  . Chlorhexidine Gluconate Cloth  6 each Topical Daily  . DULoxetine  20 mg Oral Daily  . latanoprost  1 drop Both Eyes QHS  . levothyroxine  50 mcg Oral QAC breakfast  . lisinopril  20 mg Oral Daily  . NIFEdipine  10 mg Oral Q8H   Continuous Infusions: . sodium chloride Stopped (07/15/19 2322)     LOS: 1 day      Debbe Odea, MD [facility] Pager: [email or web address] Password [email or web address] 07/16/2019, 11:07 AM

## 2019-07-17 DIAGNOSIS — R400 Somnolence: Secondary | ICD-10-CM

## 2019-07-17 DIAGNOSIS — Z515 Encounter for palliative care: Secondary | ICD-10-CM

## 2019-07-17 NOTE — Progress Notes (Signed)
OT Cancellation Note and Discharge  Patient Details Name: Amber Fowler MRN: [id number] DOB: Oct 10, 1918   Cancelled Treatment:    Reason Eval/Treat Not Completed: OT screened, no needs identified, will sign off. Pt seen by PT earlier today with inability to participate --they recommended daily PROM to help with prevent further contractures for ease with self care and I would agree. Pt bed/wheelchair bound pta per chart and would venture to say she was total care for basic ADLs. Per chart patient is comfort care.  [facility]  OTR/L Acute [employer] Pager [phone] Office [phone]       07/17/2019, 2:04 PM

## 2019-07-17 NOTE — Progress Notes (Signed)
PROGRESS NOTE    SCHARLOTTE Fowler   [id number]  DOB: 1919-04-20  DOA: 07/15/2019 PCP: Alanson Puls, [facility]   Brief Narrative:  Amber Fowler  is a 85 y.o. female with medical history significant of hypertension advanced dementia (baseline nonverbal), chronic ambulatory dysfunction/chair bound, sent from [place] facility by her caregiver after being found with nose bleed and hematoma on her forehead probably from a unwitnessed fall.  EMS noticed patient has a left forehead hematoma.  According to home care nurse, and has been weaker over the last 2 to 3 days, and has been lethargic.  In the ED: CT of the head showed a subacute left PCA stroke, small volume acute subarachnoid hemorrhage along the anterior left frontal lobe, additional scattered small volume acute supratentorial subarachnoid hemorrhage greatest along the lateral right temporal lobe, small mixed density subdural hematoma also questioned overlying the anterior left lobe, 5 mm in thickness.  Partially calcified left frontal meningioma increased in size since 01/09/2015 to 2.1 cm.  Chronic encephalomalacia with anterior lateral right frontal lobe Neurology consult was requested part was likely atheroembolic versus cardioembolic.  The family decided to make the patient DO NOT RESUSCITATE and planned to place her under hospice care.  Subjective: No complaints. Confused.     Assessment & Plan:   Active Problems:   SAH (subarachnoid hemorrhage)    Subdural hematoma due to concussion    Stroke (cerebrum)   -As mentioned above, she is comfort care-speech therapy has evaluated her and the patient is able to eat and drink and therefore diet has been ordered  - I have spoken with her son, Dr Carlis Abbott,  today and he is hopeful to transfer her back to [place] with private sitters- she had a daytime sitter prior to coming into the hospital and he will try to arrange for a nighttime sitter as well- he does confirm that he wants  comfort care - she is not a candidate for [facility] as she is still eating and drinking.   Dementia with superimposed acute confusion related to above neurological injuries -The patient is quite confused and speech does not make any sense-follow for behavioral disturbances-at this time she does not appear to require a sitter - later in the day today, she was noted to be more lethargic- ? If bleeds are extending- her son also states that Cymbalta makes her sleepy and is asking if we can hold it to see if she will awaken thus have held both Cymbalta and PRN Xanax (she did not receive any Xanax thus far)  Nondisplaced fractures of left nasal bone/frontal process of maxilla -This is likely a result of the fall and also the cause of her nosebleed which has stopped -Conservative management  Hypertension -Due to acute CVA, I will allow for permissive hypertension   Time spent in minutes: 35 DVT prophylaxis: SCDs Code Status: DO NOT RESUSCITATE Family Communication: son, Dr Jeanann Lewandowsky Disposition Plan: return to [place] with private sitters Consultants:   Neurology Procedures:    Antimicrobials:  Anti-infectives (From admission, onward)   None       Objective: Vitals:   07/16/19 1942 07/16/19 2313 07/17/19 0354 07/17/19 0800  BP: 99/76 (!) 150/76 (!) 150/64 (!) 151/106  Pulse: 74 66 69 85  Resp: 20 20 16 16   Temp: 97.9 F (36.6 C) 98.7 F (37.1 C) 98.5 F (36.9 C) 97.7 F (36.5 C)  TempSrc: Axillary Oral Axillary Oral  SpO2: 99% 100% 100% 95%  Intake/Output Summary (Last 24 hours) at 07/17/2019 1103 Last data filed at 07/17/2019 0831 Gross per 24 hour  Intake 120 ml  Output 1050 ml  Net -930 ml   There were no vitals filed for this visit.  Examination: General exam: Appears comfortable  HEENT: PERRLA, oral mucosa moist, no sclera icterus or thrush Respiratory system: Clear to auscultation. Respiratory effort normal. Cardiovascular system: S1 & S2  heard,  No murmurs  Gastrointestinal system: Abdomen soft, non-tender, nondistended. Normal bowel sounds   Central nervous system: Alert - confused Extremities: No cyanosis, clubbing or edema Skin: No rashes or ulcers Psychiatry:  Mood & affect appropriate.     Data Reviewed: I have personally reviewed following labs and imaging studies  CBC: Recent Labs  Lab 07/15/19 1250 07/15/19 1335  WBC  --  10.2  NEUTROABS  --  8.0*  HGB 15.3* 13.8  HCT 45.0 43.5  MCV  --  96.2  PLT  --  99991111   Basic Metabolic Panel: Recent Labs  Lab 07/15/19 1250 07/15/19 1335  NA 143 142  K 5.4* 4.3  CL 107 108  CO2  --  24  GLUCOSE 124* 106*  BUN 21 12  CREATININE 0.70 0.68  CALCIUM  --  9.2  MG  --  2.0   GFR: CrCl cannot be calculated (Unknown ideal weight.). Liver Function Tests: Recent Labs  Lab 07/15/19 1335  AST 30  ALT 10  ALKPHOS 51  BILITOT 0.8  PROT 7.2  ALBUMIN 3.3*   No results for input(s): LIPASE, AMYLASE in the last 168 hours. No results for input(s): AMMONIA in the last 168 hours. Coagulation Profile: Recent Labs  Lab 07/15/19 1335  INR 1.0   Cardiac Enzymes: No results for input(s): CKTOTAL, CKMB, CKMBINDEX, TROPONINI in the last 168 hours. BNP (last 3 results) No results for input(s): PROBNP in the last 8760 hours. HbA1C: Recent Labs    07/16/19 0253  HGBA1C 5.5   CBG: Recent Labs  Lab 07/15/19 1230  GLUCAP 111*   Lipid Profile: Recent Labs    07/16/19 0253  CHOL 197  HDL 43  LDLCALC 147*  TRIG 34  CHOLHDL 4.6   Thyroid Function Tests: No results for input(s): TSH, T4TOTAL, FREET4, T3FREE, THYROIDAB in the last 72 hours. Anemia Panel: No results for input(s): VITAMINB12, FOLATE, FERRITIN, TIBC, IRON, RETICCTPCT in the last 72 hours. Urine analysis:    Component Value Date/Time   COLORURINE YELLOW 07/15/2019 2147   APPEARANCEUR CLOUDY (A) 07/15/2019 2147   LABSPEC 1.015 07/15/2019 2147   PHURINE 6.0 07/15/2019 2147   GLUCOSEU  NEGATIVE 07/15/2019 2147   HGBUR SMALL (A) 07/15/2019 2147   BILIRUBINUR NEGATIVE 07/15/2019 2147   KETONESUR 20 (A) 07/15/2019 2147   PROTEINUR 30 (A) 07/15/2019 2147   UROBILINOGEN 0.2 01/09/2015 1150   NITRITE NEGATIVE 07/15/2019 2147   LEUKOCYTESUR NEGATIVE 07/15/2019 2147   Sepsis Labs: @LABRCNTIP (procalcitonin:4,lacticidven:4) ) Recent Results (from the past 240 hour(s))  SARS CORONAVIRUS 2 (TAT 6-24 HRS) Nasopharyngeal Nasopharyngeal Swab     Status: None   Collection Time: 07/15/19  8:36 PM   Specimen: Nasopharyngeal Swab  Result Value Ref Range Status   SARS Coronavirus 2 NEGATIVE NEGATIVE Final    Comment: (NOTE) SARS-CoV-2 target nucleic acids are NOT DETECTED. The SARS-CoV-2 RNA is generally detectable in upper and lower respiratory specimens during the acute phase of infection. Negative results do not preclude SARS-CoV-2 infection, do not rule out co-infections with other pathogens, and should not be used  as the sole basis for treatment or other patient management decisions. Negative results must be combined with clinical observations, patient history, and epidemiological information. The expected result is Negative. Fact Sheet for Patients: SugarRoll.be Fact Sheet for Healthcare Providers: https://www.woods-mathews.com/ This test is not yet approved or cleared by the [place] FDA and  has been authorized for detection and/or diagnosis of SARS-CoV-2 by FDA under an Emergency Use Authorization (EUA). This EUA will remain  in effect (meaning this test can be used) for the duration of the COVID-19 declaration under Section 56 4(b)(1) of the Act, 21 U.S.C. section 360bbb-3(b)(1), unless the authorization is terminated or revoked sooner. Performed at [facility], [place] [address], [place], [place] [address]   MRSA PCR Screening     Status: None   Collection Time: 07/15/19 10:13 PM   Specimen: Nasal Mucosa;  Nasopharyngeal  Result Value Ref Range Status   MRSA by PCR NEGATIVE NEGATIVE Final    Comment:        The GeneXpert MRSA Assay (FDA approved for NASAL specimens only), is one component of a comprehensive MRSA colonization surveillance program. It is not intended to diagnose MRSA infection nor to guide or monitor treatment for MRSA infections. Performed at [facility], [place] [address], [place], [place] [address]          Radiology Studies: DG Pelvis 1-2 Views  Result Date: 07/15/2019 CLINICAL DATA:  Seizure.  Nonverbal. EXAM: PELVIS - 1-2 VIEW COMPARISON:  01/10/2015 FINDINGS: The right hip prosthesis is normally located. No complicating features. The right hip is also normally located. No right hip fracture. The pubic symphysis and SI joints are intact. No definite pelvic fractures. IMPRESSION: No acute bony findings. Electronically Signed   By: Marijo Sanes M.D.   On: 07/15/2019 12:35   CT HEAD WO CONTRAST  Result Date: 07/15/2019 CLINICAL DATA:  Seizure, nontraumatic, facial trauma. EXAM: CT HEAD WITHOUT CONTRAST CT MAXILLOFACIAL WITHOUT CONTRAST CT CERVICAL SPINE WITHOUT CONTRAST CT ABDOMEN AND PELVIS WITHOUT CONTRAST TECHNIQUE: Contiguous axial images were obtained from the base of the skull through the vertex without intravenous contrast. Multidetector CT imaging of the maxillofacial structures was performed. Multiplanar CT image reconstructions were also generated. A small metallic BB was placed on the right temple in order to reliably differentiate right from left. Multidetector CT imaging of the cervical spine was performed without intravenous contrast. Multiplanar CT image reconstructions were also generated. COMPARISON:  CT head and cervical spine 01/09/2015 FINDINGS: CT HEAD FINDINGS Brain: Since prior head CT 01/09/2015 there has been interval increase in size of a partially calcified meningioma with both hyperdense soft tissue components and calcified components.  This now measures 2.1 x 1.6 x 2.4 cm (previously 1.5 cm). There is small volume acute subarachnoid hemorrhage along the anterior left frontal lobe, adjacent to meningioma. A small mixed density left subdural collection is also questioned overlying the anterior left frontal lobe, measuring 5 mm in thickness (series 3, image 14). Additional scattered supratentorial small volume acute subarachnoid hemorrhage, most notably along the lateral right temporal lobe (series 3, image 13). Abnormal cortical/subcortical hypodensity throughout much of the left PCA vascular territory within the left temporal and occipital lobes consistent with infarct, likely early subacute. Associated curvilinear hyperdensity at this site may reflect petechial hemorrhage or additional small volume acute subarachnoid hemorrhage. Redemonstrated small chronic focus of encephalomalacia within the anterolateral right frontal lobe. No midline shift. Ill-defined hypoattenuation within the cerebral white matter is nonspecific, but consistent with chronic  small vessel ischemic disease. Moderate generalized parenchymal atrophy. Calcifications redemonstrated within the basal ganglia and cerebellar white matter. Vascular: No definite hyperdense vessel. Atherosclerotic calcifications. Skull: Normal. Negative for fracture or focal lesion. Other: Right mastoid effusion. Redemonstrated left suboccipital scalp lesion measuring 2.4 cm, likely benign CT MAXILLOFACIAL FINDINGS Osseous: Non-displaced fracture of the left nasal bone/frontal process of maxilla. No other maxillofacial fracture is identified. Orbits: No acute abnormality. Sinuses: Mild ethmoid sinus mucosal thickening. Soft tissues: Swelling of the left frontotemporal scalp, forehead and left maxillofacial soft tissues. CT CERVICAL SPINE FINDINGS Alignment: Reversal of the expected cervical lordosis. No significant spondylolisthesis. Skull base and vertebrae: The basion-dental and atlanto-dental  intervals are maintained.No evidence of acute fracture to the cervical spine. Soft tissues and spinal canal: No prevertebral fluid or swelling. No visible canal hematoma. Atherosclerotic disease. Disc levels: Congenital nonunion of the posterior arch of C1. Redemonstrated severe cervical spondylosis with multilevel disc degeneration, posterior disc osteophytes, uncovertebral and facet hypertrophy. Redemonstrated degenerative fusion across the C5-C6 disc space and of the right sided articular pillars at this level. There may also be fusion across the C6-C7 disc space. Degenerative fusion of the C7 and T1 articular pillars on the right. Upper chest: Imaged lung apices clear.  No visible pneumothorax. These results were called by telephone at the time of interpretation on 07/15/2019 at 2:03 pm to provider [facility] , who verbally acknowledged these results. IMPRESSION: CT head: 1. A partially calcified left frontal meningioma has increased in size since head CT 01/09/2015, now 2.1 cm. 2. Small volume acute subarachnoid hemorrhage along the anterior left frontal lobe, adjacent to the meningioma. Additional scattered small volume acute supratentorial subarachnoid hemorrhage, greatest along the lateral right temporal lobe. 3. A small mixed density subdural hematoma is also questioned overlying the anterior left frontal lobe, 5 mm in thickness. 4. Infarction change throughout much of the left PCA vascular territory, likely early subacute. Associated gyriform hyperdensity at this site may reflect petechial hemorrhage or additional small volume acute subarachnoid hemorrhage. 5. Chronic encephalomalacia within the anterolateral right frontal lobe. 6. Generalized parenchymal atrophy and chronic small vessel ischemic disease. 7. Right mastoid effusion. CT maxillofacial: 1. Nondisplaced fracture of the left nasal bone/frontal process of maxilla. 2. Left frontotemporal scalp, forehead and left maxillofacial soft tissue  swelling. 3. Mild ethmoid sinus mucosal thickening. CT cervical spine: 1. No evidence of acute fracture to the cervical spine. 2. Redemonstrated advanced cervical spondylosis with multiple sites of degenerative fusion, as detailed. Electronically Signed   By: Kellie Simmering DO   On: 07/15/2019 14:14   CT CERVICAL SPINE WO CONTRAST  Result Date: 07/15/2019 CLINICAL DATA:  Seizure, nontraumatic, facial trauma. EXAM: CT HEAD WITHOUT CONTRAST CT MAXILLOFACIAL WITHOUT CONTRAST CT CERVICAL SPINE WITHOUT CONTRAST CT ABDOMEN AND PELVIS WITHOUT CONTRAST TECHNIQUE: Contiguous axial images were obtained from the base of the skull through the vertex without intravenous contrast. Multidetector CT imaging of the maxillofacial structures was performed. Multiplanar CT image reconstructions were also generated. A small metallic BB was placed on the right temple in order to reliably differentiate right from left. Multidetector CT imaging of the cervical spine was performed without intravenous contrast. Multiplanar CT image reconstructions were also generated. COMPARISON:  CT head and cervical spine 01/09/2015 FINDINGS: CT HEAD FINDINGS Brain: Since prior head CT 01/09/2015 there has been interval increase in size of a partially calcified meningioma with both hyperdense soft tissue components and calcified components. This now measures 2.1 x 1.6 x 2.4 cm (previously 1.5 cm). There  is small volume acute subarachnoid hemorrhage along the anterior left frontal lobe, adjacent to meningioma. A small mixed density left subdural collection is also questioned overlying the anterior left frontal lobe, measuring 5 mm in thickness (series 3, image 14). Additional scattered supratentorial small volume acute subarachnoid hemorrhage, most notably along the lateral right temporal lobe (series 3, image 13). Abnormal cortical/subcortical hypodensity throughout much of the left PCA vascular territory within the left temporal and occipital lobes  consistent with infarct, likely early subacute. Associated curvilinear hyperdensity at this site may reflect petechial hemorrhage or additional small volume acute subarachnoid hemorrhage. Redemonstrated small chronic focus of encephalomalacia within the anterolateral right frontal lobe. No midline shift. Ill-defined hypoattenuation within the cerebral white matter is nonspecific, but consistent with chronic small vessel ischemic disease. Moderate generalized parenchymal atrophy. Calcifications redemonstrated within the basal ganglia and cerebellar white matter. Vascular: No definite hyperdense vessel. Atherosclerotic calcifications. Skull: Normal. Negative for fracture or focal lesion. Other: Right mastoid effusion. Redemonstrated left suboccipital scalp lesion measuring 2.4 cm, likely benign CT MAXILLOFACIAL FINDINGS Osseous: Non-displaced fracture of the left nasal bone/frontal process of maxilla. No other maxillofacial fracture is identified. Orbits: No acute abnormality. Sinuses: Mild ethmoid sinus mucosal thickening. Soft tissues: Swelling of the left frontotemporal scalp, forehead and left maxillofacial soft tissues. CT CERVICAL SPINE FINDINGS Alignment: Reversal of the expected cervical lordosis. No significant spondylolisthesis. Skull base and vertebrae: The basion-dental and atlanto-dental intervals are maintained.No evidence of acute fracture to the cervical spine. Soft tissues and spinal canal: No prevertebral fluid or swelling. No visible canal hematoma. Atherosclerotic disease. Disc levels: Congenital nonunion of the posterior arch of C1. Redemonstrated severe cervical spondylosis with multilevel disc degeneration, posterior disc osteophytes, uncovertebral and facet hypertrophy. Redemonstrated degenerative fusion across the C5-C6 disc space and of the right sided articular pillars at this level. There may also be fusion across the C6-C7 disc space. Degenerative fusion of the C7 and T1 articular pillars  on the right. Upper chest: Imaged lung apices clear.  No visible pneumothorax. These results were called by telephone at the time of interpretation on 07/15/2019 at 2:03 pm to provider [facility] , who verbally acknowledged these results. IMPRESSION: CT head: 1. A partially calcified left frontal meningioma has increased in size since head CT 01/09/2015, now 2.1 cm. 2. Small volume acute subarachnoid hemorrhage along the anterior left frontal lobe, adjacent to the meningioma. Additional scattered small volume acute supratentorial subarachnoid hemorrhage, greatest along the lateral right temporal lobe. 3. A small mixed density subdural hematoma is also questioned overlying the anterior left frontal lobe, 5 mm in thickness. 4. Infarction change throughout much of the left PCA vascular territory, likely early subacute. Associated gyriform hyperdensity at this site may reflect petechial hemorrhage or additional small volume acute subarachnoid hemorrhage. 5. Chronic encephalomalacia within the anterolateral right frontal lobe. 6. Generalized parenchymal atrophy and chronic small vessel ischemic disease. 7. Right mastoid effusion. CT maxillofacial: 1. Nondisplaced fracture of the left nasal bone/frontal process of maxilla. 2. Left frontotemporal scalp, forehead and left maxillofacial soft tissue swelling. 3. Mild ethmoid sinus mucosal thickening. CT cervical spine: 1. No evidence of acute fracture to the cervical spine. 2. Redemonstrated advanced cervical spondylosis with multiple sites of degenerative fusion, as detailed. Electronically Signed   By: Kellie Simmering DO   On: 07/15/2019 14:14   DG Chest Port 1 View  Result Date: 07/15/2019 CLINICAL DATA:  Seizure EXAM: PORTABLE CHEST 1 VIEW COMPARISON:  03/15/2017 FINDINGS: Heart size appears within normal limits. Calcified thoracic  aorta. Lung volumes are low. No focal airspace consolidation, pleural effusion, or pneumothorax. Osseous structures appear diffusely  demineralized. Partially visualized ORIF hardware of the proximal left humerus. Severe degenerative changes of the shoulders. Linear lucency through the posterosuperior aspect of the right scapula suspicious for fracture. IMPRESSION: 1. Linear lucency through the posterosuperior aspect of the right scapula suspicious for fracture. 2. No acute cardiopulmonary findings. Electronically Signed   By: Davina Poke M.D.   On: 07/15/2019 12:35   ECHOCARDIOGRAM COMPLETE  Result Date: 07/15/2019   ECHOCARDIOGRAM REPORT   Patient Name:   ANTONESHA COHRAN Date of Exam: 07/15/2019 Medical Rec #:  [id number]        Height:       66.0 in Accession #:    [id number]       Weight:       118.0 lb Date of Birth:  1919-05-24       BSA:          1.60 m Patient Age:    62 years         BP:           156/54 mmHg Patient Gender: F                HR:           71 bpm. Exam Location:  Inpatient Procedure: 2D Echo, Color Doppler and Cardiac Doppler Indications:    Stroke  History:        Patient has no prior history of Echocardiogram examinations.                 Stroke; Risk Factors:Hypertension and Dyslipidemia.  Sonographer:    Raquel Sarna Senior RDCS Referring Phys: [id number] Miller  1. Left ventricular ejection fraction, by visual estimation, is 65 to 70%. The left ventricle has normal function. Left ventricular septal wall thickness was severely increased. Severely increased left ventricular posterior wall thickness. There is no left ventricular hypertrophy.  2. Left ventricular diastolic parameters are consistent with Grade I diastolic dysfunction (impaired relaxation).  3. The left ventricle has no regional wall motion abnormalities.  4. Global right ventricle has normal systolic function.The right ventricular size is normal. No increase in right ventricular wall thickness.  5. Left atrial size was normal.  6. Right atrial size was normal.  7. Mild thickening of the anterior and posterior mitral valve leaflet(s).  8.  Moderate mitral annular calcification.  9. The mitral valve is normal in structure. No evidence of mitral valve regurgitation. No evidence of mitral stenosis. 10. The tricuspid valve is normal in structure. Tricuspid valve regurgitation is not demonstrated. 11. The aortic valve is tricuspid. Aortic valve regurgitation is not visualized. No evidence of aortic valve sclerosis or stenosis. 12. The pulmonic valve was normal in structure. Pulmonic valve regurgitation is not visualized. 13. TR signal is inadequate for assessing pulmonary artery systolic pressure. 14. The inferior vena cava is normal in size with greater than 50% respiratory variability, suggesting right atrial pressure of 3 mmHg. FINDINGS  Left Ventricle: Left ventricular ejection fraction, by visual estimation, is 65 to 70%. The left ventricle has normal function. The left ventricle has no regional wall motion abnormalities. Severely increased left ventricular posterior wall thickness. There is no left ventricular hypertrophy. Left ventricular diastolic parameters are consistent with Grade I diastolic dysfunction (impaired relaxation). Right Ventricle: The right ventricular size is normal. No increase in right ventricular wall thickness. Global RV systolic function is has normal systolic  function. Left Atrium: Left atrial size was normal in size. Right Atrium: Right atrial size was normal in size Pericardium: There is no evidence of pericardial effusion. Mitral Valve: The mitral valve is normal in structure. There is mild thickening of the anterior and posterior mitral valve leaflet(s). Moderate mitral annular calcification. No evidence of mitral valve regurgitation. No evidence of mitral valve stenosis by observation. Tricuspid Valve: The tricuspid valve is normal in structure. Tricuspid valve regurgitation is not demonstrated. Aortic Valve: The aortic valve is tricuspid. . There is moderate thickening of the aortic valve. Aortic valve regurgitation is  not visualized. The aortic valve is structurally normal, with no evidence of sclerosis or stenosis. There is moderate thickening of the aortic valve. Pulmonic Valve: The pulmonic valve was normal in structure. Pulmonic valve regurgitation is not visualized. Pulmonic regurgitation is not visualized. Aorta: The aortic root, ascending aorta and aortic arch are all structurally normal, with no evidence of dilitation or obstruction. Venous: The inferior vena cava is normal in size with greater than 50% respiratory variability, suggesting right atrial pressure of 3 mmHg. IAS/Shunts: No atrial level shunt detected by color flow Doppler. There is no evidence of a patent foramen ovale. No ventricular septal defect is seen or detected. There is no evidence of an atrial septal defect.  LEFT VENTRICLE PLAX 2D LVIDd:         2.09 cm LVIDs:         1.36 cm LV PW:         1.46 cm LV IVS:        1.55 cm LVOT diam:     1.70 cm LV SV:         10 ml LV SV Index:   6.08 LVOT Area:     2.27 cm  RIGHT VENTRICLE             IVC RV Basal diam:  1.95 cm     IVC diam: 0.65 cm RV S prime:     10.70 cm/s TAPSE (M-mode): 1.0 cm LEFT ATRIUM           Index LA diam:      3.40 cm 2.13 cm/m LA Vol (A2C): 27.0 ml 16.89 ml/m LA Vol (A4C): 38.4 ml 24.02 ml/m  AORTIC VALVE LVOT Vmax:   122.00 cm/s LVOT Vmean:  81.900 cm/s LVOT VTI:    0.252 m  AORTA Ao Root diam: 2.80 cm Ao Asc diam:  2.80 cm  SHUNTS Systemic VTI:  0.25 m Systemic Diam: 1.70 cm  Fransico Him MD Electronically signed by Fransico Him MD Signature Date/Time: 07/15/2019/3:59:04 PM    Final    CT Maxillofacial Wo Contrast  Result Date: 07/15/2019 CLINICAL DATA:  Seizure, nontraumatic, facial trauma. EXAM: CT HEAD WITHOUT CONTRAST CT MAXILLOFACIAL WITHOUT CONTRAST CT CERVICAL SPINE WITHOUT CONTRAST CT ABDOMEN AND PELVIS WITHOUT CONTRAST TECHNIQUE: Contiguous axial images were obtained from the base of the skull through the vertex without intravenous contrast. Multidetector CT  imaging of the maxillofacial structures was performed. Multiplanar CT image reconstructions were also generated. A small metallic BB was placed on the right temple in order to reliably differentiate right from left. Multidetector CT imaging of the cervical spine was performed without intravenous contrast. Multiplanar CT image reconstructions were also generated. COMPARISON:  CT head and cervical spine 01/09/2015 FINDINGS: CT HEAD FINDINGS Brain: Since prior head CT 01/09/2015 there has been interval increase in size of a partially calcified meningioma with both hyperdense soft tissue components and calcified components.  This now measures 2.1 x 1.6 x 2.4 cm (previously 1.5 cm). There is small volume acute subarachnoid hemorrhage along the anterior left frontal lobe, adjacent to meningioma. A small mixed density left subdural collection is also questioned overlying the anterior left frontal lobe, measuring 5 mm in thickness (series 3, image 14). Additional scattered supratentorial small volume acute subarachnoid hemorrhage, most notably along the lateral right temporal lobe (series 3, image 13). Abnormal cortical/subcortical hypodensity throughout much of the left PCA vascular territory within the left temporal and occipital lobes consistent with infarct, likely early subacute. Associated curvilinear hyperdensity at this site may reflect petechial hemorrhage or additional small volume acute subarachnoid hemorrhage. Redemonstrated small chronic focus of encephalomalacia within the anterolateral right frontal lobe. No midline shift. Ill-defined hypoattenuation within the cerebral white matter is nonspecific, but consistent with chronic small vessel ischemic disease. Moderate generalized parenchymal atrophy. Calcifications redemonstrated within the basal ganglia and cerebellar white matter. Vascular: No definite hyperdense vessel. Atherosclerotic calcifications. Skull: Normal. Negative for fracture or focal lesion.  Other: Right mastoid effusion. Redemonstrated left suboccipital scalp lesion measuring 2.4 cm, likely benign CT MAXILLOFACIAL FINDINGS Osseous: Non-displaced fracture of the left nasal bone/frontal process of maxilla. No other maxillofacial fracture is identified. Orbits: No acute abnormality. Sinuses: Mild ethmoid sinus mucosal thickening. Soft tissues: Swelling of the left frontotemporal scalp, forehead and left maxillofacial soft tissues. CT CERVICAL SPINE FINDINGS Alignment: Reversal of the expected cervical lordosis. No significant spondylolisthesis. Skull base and vertebrae: The basion-dental and atlanto-dental intervals are maintained.No evidence of acute fracture to the cervical spine. Soft tissues and spinal canal: No prevertebral fluid or swelling. No visible canal hematoma. Atherosclerotic disease. Disc levels: Congenital nonunion of the posterior arch of C1. Redemonstrated severe cervical spondylosis with multilevel disc degeneration, posterior disc osteophytes, uncovertebral and facet hypertrophy. Redemonstrated degenerative fusion across the C5-C6 disc space and of the right sided articular pillars at this level. There may also be fusion across the C6-C7 disc space. Degenerative fusion of the C7 and T1 articular pillars on the right. Upper chest: Imaged lung apices clear.  No visible pneumothorax. These results were called by telephone at the time of interpretation on 07/15/2019 at 2:03 pm to provider [facility] , who verbally acknowledged these results. IMPRESSION: CT head: 1. A partially calcified left frontal meningioma has increased in size since head CT 01/09/2015, now 2.1 cm. 2. Small volume acute subarachnoid hemorrhage along the anterior left frontal lobe, adjacent to the meningioma. Additional scattered small volume acute supratentorial subarachnoid hemorrhage, greatest along the lateral right temporal lobe. 3. A small mixed density subdural hematoma is also questioned overlying the  anterior left frontal lobe, 5 mm in thickness. 4. Infarction change throughout much of the left PCA vascular territory, likely early subacute. Associated gyriform hyperdensity at this site may reflect petechial hemorrhage or additional small volume acute subarachnoid hemorrhage. 5. Chronic encephalomalacia within the anterolateral right frontal lobe. 6. Generalized parenchymal atrophy and chronic small vessel ischemic disease. 7. Right mastoid effusion. CT maxillofacial: 1. Nondisplaced fracture of the left nasal bone/frontal process of maxilla. 2. Left frontotemporal scalp, forehead and left maxillofacial soft tissue swelling. 3. Mild ethmoid sinus mucosal thickening. CT cervical spine: 1. No evidence of acute fracture to the cervical spine. 2. Redemonstrated advanced cervical spondylosis with multiple sites of degenerative fusion, as detailed. Electronically Signed   By: Kellie Simmering DO   On: 07/15/2019 14:14      Scheduled Meds: .  stroke: mapping our early stages of recovery book  Does not apply Once  . Chlorhexidine Gluconate Cloth  6 each Topical Daily  . DULoxetine  20 mg Oral Daily  . latanoprost  1 drop Both Eyes QHS  . levothyroxine  50 mcg Oral QAC breakfast   Continuous Infusions:    LOS: 2 days      Debbe Odea, MD [facility] Pager: [email or web address] Password [email or web address] 07/17/2019, 11:03 AM

## 2019-07-17 NOTE — Evaluation (Signed)
Physical Therapy Evaluation Patient Details Name: Amber Fowler MRN: [id number] DOB: 1919-07-13 Today's Date: 07/17/2019   History of Present Illness    85 y.o.femalewith medical history significant ofhypertension advanced dementia(baseline nonverbal),chronic ambulatory dysfunction/chair bound,sent from [facility] facility by her caregiver after being found with nose bleed and hematoma on her forehead probablyfrom a unwitnessed fall. EMSnoticed patient has a left forehead hematoma.According to home care nurse,and has been weaker over the last 2 to 3 days,and has been lethargic.  CT of the head showed a subacute left PCA stroke, small volume acute subarachnoid hemorrhage along the anterior left frontal lobe, additional scattered small volume acute supratentorial subarachnoid hemorrhage greatest along the lateral right temporal lobe, small mixed density subdural hematoma also questioned overlying the anterior left lobe, 5 mm in thickness.  Partially calcified left frontal meningioma increased in size since 01/09/2015 to 2.1 cm.  Chronic encephalomalacia with anterior lateral right frontal lobe Neurology consult was requested part was likely atheroembolic versus cardioembolic.  The family decided to make the patient DO NOT RESUSCITATE and planned to place her under hospice care.    Clinical Impression  Pt presents with profound limitations due to advanced medical problems including advanced dementia and history or nonambulatory/dependent mobility.  Pt does not arouse or respond to verbal or tactile stimulation (noxious stim was not applied) and does not follow commands.  General stiffness with some evidence of contractures noted UE>LE, recommend daily joint ROM during personal care to prevent further loss of ROM.  Recommend frequent repositioning to protect skin and prevent development of pressure ulcers.  Son arrived at end of session, updated on these findings. No indication pt would  benefit from PT as pt is historically nonambulatory and currently profoundly lethargic and unable to participate in care.     Follow Up Recommendations Other (comment)(Hospice care if family chooses)    Equipment Recommendations  None recommended by PT    Recommendations for Other Services       Precautions / Restrictions Precautions Precautions: Fall Precaution Comments: bed alarm Restrictions Weight Bearing Restrictions: No      Mobility  Bed Mobility Overal bed mobility: Needs Assistance Bed Mobility: Rolling Rolling: Total assist         General bed mobility comments: pt does not assist and limited due to stiffness in some joints  Transfers                 General transfer comment: unable to participate in transfer  Ambulation/Gait             General Gait Details: nonambulatory  Stairs            Wheelchair Mobility    Modified Rankin (Stroke Patients Only) Modified Rankin (Stroke Patients Only) Pre-Morbid Rankin Score: Severe disability Modified Rankin: Severe disability     Balance Overall balance assessment: No apparent balance deficits (not formally assessed)                                           Pertinent Vitals/Pain Pain Assessment: Faces Faces Pain Scale: No hurt    Home Living Family/patient expects to be discharged to:: Hospice/Palliative care                 Additional Comments: family (son) awaiting discussion with neurologist to determine postacute plan including hospice care    Prior Function Level of Independence: Needs  assistance   Gait / Transfers Assistance Needed: non ambulatory, dependent lift to w/c  ADL's / Homemaking Assistance Needed: able to hold rail for shower, otherwise dependent for care        Hand Dominance        Extremity/Trunk Assessment   Upper Extremity Assessment Upper Extremity Assessment: Difficult to assess due to impaired cognition(bil UE flexion  contractures noted)    Lower Extremity Assessment Lower Extremity Assessment: Difficult to assess due to impaired cognition(limited knee flex and hip abd bil in sup)       Communication   Communication: Other (comment)(lethargy/obtunded)  Cognition Arousal/Alertness: Lethargic(does not arouse to voice or tactile; didn't use noxious stim) Behavior During Therapy: Flat affect(unresponsive to stimulation) Overall Cognitive Status: Impaired/Different from baseline Area of Impairment: Attention                   Current Attention Level: (none)           General Comments: pt does not arouse      General Comments General comments (skin integrity, edema, etc.): pt supine with head rotated to right, HOB >30*, arms in flexed posture --> repositioned with pillow under right elbow and off-loaded right greater trochanter; floated heels with pillow, SCDs operating; bed alarm set    Exercises     Assessment/Plan    PT Assessment Patent does not need any further PT services  PT Problem List         PT Treatment Interventions      PT Goals (Current goals can be found in the Care Plan section)  Acute Rehab PT Goals Patient Stated Goal: nonverbal PT Goal Formulation: All assessment and education complete, DC therapy    Frequency     Barriers to discharge        Co-evaluation               AM-PAC PT "6 Clicks" Mobility  Outcome Measure Help needed turning from your back to your side while in a flat bed without using bedrails?: Total Help needed moving from lying on your back to sitting on the side of a flat bed without using bedrails?: Total Help needed moving to and from a bed to a chair (including a wheelchair)?: Total Help needed standing up from a chair using your arms (e.g., wheelchair or bedside chair)?: Total Help needed to walk in hospital room?: Total Help needed climbing 3-5 steps with a railing? : Total 6 Click Score: 6    End of Session   Activity  Tolerance: Other (comment)(no participation or response) Patient left: in bed;with call bell/phone within reach;with bed alarm set;with family/visitor present(son arrived at session end) Nurse Communication: Mobility status PT Visit Diagnosis: Adult, failure to thrive (R62.7)    [id number] PT Time Calculation (min) (ACUTE ONLY): 26 min   Charges:   PT Evaluation $PT Eval Low Complexity: 1 Low          Kearney Hard, PT, DPT, MS Board Certified Geriatric Clinical Specialist   Herbie Drape 07/17/2019, 11:38 AM

## 2019-07-17 NOTE — Consult Note (Signed)
Palliative Medicine  Name: KESHA MISKA Date: 07/17/2019 MRN: [id number]  DOB: 04-Dec-1918  Patient Care Team: [facility], Onsite Care as PCP - [facility]sc[facility], [facility] and Palliative Medicine) Julianne Handler, NP as Nurse Practitioner (Hospice and Palliative Medicine)    REASON FOR CONSULTATION: NENEH ADAMI is a 85 y.o. female with multiple medical problems including advanced dementia, who is nonverbal wheelchair-bound at baseline.  Patient was admitted to the hospital on 07/15/2019 with a nosebleed and hematoma following an unwitnessed fall.  CT scan revealed a subacute left PCA stroke, small volume acute subarachnoid hemorrhage and anterior left frontal lobe with additional scattered small volume acute supratentorial subarachnoid hemorrhages.  Patient was also found to have a nondisplaced fracture of the left nasal bone/frontal process of the maxilla.  Family has been considering sending patient back to ALF with hospice.  Palliative care was consulted to help address goals.  SOCIAL HISTORY:     reports that she has never smoked. She has never used smokeless tobacco. She reports that she does not drink alcohol or use drugs.   Patient is unmarried.  She lives at [place].  She has a son who is her Marine scientist.  ADVANCE DIRECTIVES:  Not on file  CODE STATUS: DNR  PAST MEDICAL HISTORY: Past Medical History:  Diagnosis Date   Alzheimer disease ([place])    [name] 01/09/2015   Anemia    Cancer of right breast ([place])    Depression    Glaucoma    Hyperlipidemia    Hypertension    Hypothyroid    Lumbar stenosis    Osteoarthritis    Osteoarthritis    Subdural hematoma ([facility])    Thyroid disease    Vitamin E deficiency     PAST SURGICAL HISTORY:  Past Surgical History:  Procedure Laterality Date   HIP ARTHROPLASTY Left 01/10/2015   Procedure: ARTHROPLASTY BIPOLAR HIP (HEMIARTHROPLASTY);  Surgeon: [name], MD;  Location: [place];   Service: Orthopedics;  Laterality: Left;   MASTECTOMY Right 2003   /notes 10/01/2010   ORIF HUMERUS FRACTURE Left 01/10/2015   Procedure: OPEN REDUCTION INTERNAL FIXATION (ORIF) HUMERAL SHAFT FRACTURE;  Surgeon: [name], MD;  Location: [place];  Service: Orthopedics;  Laterality: Left;    HEMATOLOGY/ONCOLOGY HISTORY:  Oncology History   No history exists.    ALLERGIES:  has No Known Allergies.  MEDICATIONS:  Current Facility-Administered Medications  Medication Dose Route Frequency Provider Last Rate Last Admin    stroke: mapping our early stages of recovery book   Does not apply Once Lequita Halt, MD   Stopped at 07/15/19 1640   acetaminophen (TYLENOL) tablet 650 mg  650 mg Oral Q4H PRN Lequita Halt, MD       Or   acetaminophen (TYLENOL) 160 MG/5ML solution 650 mg  650 mg Per Tube Q4H PRN Wynetta Fines T, MD       Or   acetaminophen (TYLENOL) suppository 650 mg  650 mg Rectal Q4H PRN Lequita Halt, MD       Chlorhexidine Gluconate Cloth 2 % PADS 6 each  6 each Topical Daily Wynetta Fines T, MD   6 each at 07/17/19 0804   latanoprost (XALATAN) 0.005 % ophthalmic solution 1 drop  1 drop Both Eyes QHS Wynetta Fines T, MD   1 drop at 07/16/19 2210   levothyroxine (SYNTHROID) tablet 50 mcg  50 mcg Oral QAC breakfast Lequita Halt, MD   50 mcg  at 07/17/19 0513   senna-docusate (Senokot-S) tablet 1 tablet  1 tablet Oral QHS PRN Lequita Halt, MD        VITAL SIGNS: BP 115/68 (BP Location: Left Arm)    Pulse 70    Temp 98.5 F (36.9 C) (Oral)    Resp 16    SpO2 100%  There were no vitals filed for this visit.  Estimated body mass index is 19.08 kg/m as calculated from the following:   Height as of 01/11/15: 5\' 6"  (1.676 m).   Weight as of 07/23/18: 118 lb 3.2 oz (53.6 kg).  LABS: CBC:    Component Value Date/Time   WBC 10.2 07/15/2019 1335   HGB 13.8 07/15/2019 1335   HGB 13.9 09/20/2007 1407   HCT 43.5 07/15/2019 1335   HCT 40.8 09/20/2007 1407   PLT 206 07/15/2019  1335   PLT 120 (L) 09/20/2007 1407   MCV 96.2 07/15/2019 1335   MCV 90.0 09/20/2007 1407   NEUTROABS 8.0 (H) 07/15/2019 1335   NEUTROABS 5.3 09/20/2007 1407   LYMPHSABS 1.0 07/15/2019 1335   LYMPHSABS 2.1 09/20/2007 1407   MONOABS 1.0 07/15/2019 1335   MONOABS 0.7 09/20/2007 1407   EOSABS 0.0 07/15/2019 1335   EOSABS 0.1 09/20/2007 1407   BASOSABS 0.0 07/15/2019 1335   BASOSABS 0.1 09/20/2007 1407   Comprehensive Metabolic Panel:    Component Value Date/Time   NA 142 07/15/2019 1335   K 4.3 07/15/2019 1335   CL 108 07/15/2019 1335   CO2 24 07/15/2019 1335   BUN 12 07/15/2019 1335   CREATININE 0.68 07/15/2019 1335   GLUCOSE 106 (H) 07/15/2019 1335   CALCIUM 9.2 07/15/2019 1335   AST 30 07/15/2019 1335   ALT 10 07/15/2019 1335   ALKPHOS 51 07/15/2019 1335   BILITOT 0.8 07/15/2019 1335   PROT 7.2 07/15/2019 1335   ALBUMIN 3.3 (L) 07/15/2019 1335    RADIOGRAPHIC STUDIES: DG Pelvis 1-2 Views  Result Date: 07/15/2019 CLINICAL DATA:  Seizure.  Nonverbal. EXAM: PELVIS - 1-2 VIEW COMPARISON:  01/10/2015 FINDINGS: The right hip prosthesis is normally located. No complicating features. The right hip is also normally located. No right hip fracture. The pubic symphysis and SI joints are intact. No definite pelvic fractures. IMPRESSION: No acute bony findings. Electronically Signed   By: Marijo Sanes M.D.   On: 07/15/2019 12:35   CT HEAD WO CONTRAST  Result Date: 07/15/2019 CLINICAL DATA:  Seizure, nontraumatic, facial trauma. EXAM: CT HEAD WITHOUT CONTRAST CT MAXILLOFACIAL WITHOUT CONTRAST CT CERVICAL SPINE WITHOUT CONTRAST CT ABDOMEN AND PELVIS WITHOUT CONTRAST TECHNIQUE: Contiguous axial images were obtained from the base of the skull through the vertex without intravenous contrast. Multidetector CT imaging of the maxillofacial structures was performed. Multiplanar CT image reconstructions were also generated. A small metallic BB was placed on the right temple in order to reliably  differentiate right from left. Multidetector CT imaging of the cervical spine was performed without intravenous contrast. Multiplanar CT image reconstructions were also generated. COMPARISON:  CT head and cervical spine 01/09/2015 FINDINGS: CT HEAD FINDINGS Brain: Since prior head CT 01/09/2015 there has been interval increase in size of a partially calcified meningioma with both hyperdense soft tissue components and calcified components. This now measures 2.1 x 1.6 x 2.4 cm (previously 1.5 cm). There is small volume acute subarachnoid hemorrhage along the anterior left frontal lobe, adjacent to meningioma. A small mixed density left subdural collection is also questioned overlying the anterior left frontal lobe, measuring 5  mm in thickness (series 3, image 14). Additional scattered supratentorial small volume acute subarachnoid hemorrhage, most notably along the lateral right temporal lobe (series 3, image 13). Abnormal cortical/subcortical hypodensity throughout much of the left PCA vascular territory within the left temporal and occipital lobes consistent with infarct, likely early subacute. Associated curvilinear hyperdensity at this site may reflect petechial hemorrhage or additional small volume acute subarachnoid hemorrhage. Redemonstrated small chronic focus of encephalomalacia within the anterolateral right frontal lobe. No midline shift. Ill-defined hypoattenuation within the cerebral white matter is nonspecific, but consistent with chronic small vessel ischemic disease. Moderate generalized parenchymal atrophy. Calcifications redemonstrated within the basal ganglia and cerebellar white matter. Vascular: No definite hyperdense vessel. Atherosclerotic calcifications. Skull: Normal. Negative for fracture or focal lesion. Other: Right mastoid effusion. Redemonstrated left suboccipital scalp lesion measuring 2.4 cm, likely benign CT MAXILLOFACIAL FINDINGS Osseous: Non-displaced fracture of the left nasal  bone/frontal process of maxilla. No other maxillofacial fracture is identified. Orbits: No acute abnormality. Sinuses: Mild ethmoid sinus mucosal thickening. Soft tissues: Swelling of the left frontotemporal scalp, forehead and left maxillofacial soft tissues. CT CERVICAL SPINE FINDINGS Alignment: Reversal of the expected cervical lordosis. No significant spondylolisthesis. Skull base and vertebrae: The basion-dental and atlanto-dental intervals are maintained.No evidence of acute fracture to the cervical spine. Soft tissues and spinal canal: No prevertebral fluid or swelling. No visible canal hematoma. Atherosclerotic disease. Disc levels: Congenital nonunion of the posterior arch of C1. Redemonstrated severe cervical spondylosis with multilevel disc degeneration, posterior disc osteophytes, uncovertebral and facet hypertrophy. Redemonstrated degenerative fusion across the C5-C6 disc space and of the right sided articular pillars at this level. There may also be fusion across the C6-C7 disc space. Degenerative fusion of the C7 and T1 articular pillars on the right. Upper chest: Imaged lung apices clear.  No visible pneumothorax. These results were called by telephone at the time of interpretation on 07/15/2019 at 2:03 pm to provider [facility] , who verbally acknowledged these results. IMPRESSION: CT head: 1. A partially calcified left frontal meningioma has increased in size since head CT 01/09/2015, now 2.1 cm. 2. Small volume acute subarachnoid hemorrhage along the anterior left frontal lobe, adjacent to the meningioma. Additional scattered small volume acute supratentorial subarachnoid hemorrhage, greatest along the lateral right temporal lobe. 3. A small mixed density subdural hematoma is also questioned overlying the anterior left frontal lobe, 5 mm in thickness. 4. Infarction change throughout much of the left PCA vascular territory, likely early subacute. Associated gyriform hyperdensity at this site  may reflect petechial hemorrhage or additional small volume acute subarachnoid hemorrhage. 5. Chronic encephalomalacia within the anterolateral right frontal lobe. 6. Generalized parenchymal atrophy and chronic small vessel ischemic disease. 7. Right mastoid effusion. CT maxillofacial: 1. Nondisplaced fracture of the left nasal bone/frontal process of maxilla. 2. Left frontotemporal scalp, forehead and left maxillofacial soft tissue swelling. 3. Mild ethmoid sinus mucosal thickening. CT cervical spine: 1. No evidence of acute fracture to the cervical spine. 2. Redemonstrated advanced cervical spondylosis with multiple sites of degenerative fusion, as detailed. Electronically Signed   By: Kellie Simmering DO   On: 07/15/2019 14:14   CT CERVICAL SPINE WO CONTRAST  Result Date: 07/15/2019 CLINICAL DATA:  Seizure, nontraumatic, facial trauma. EXAM: CT HEAD WITHOUT CONTRAST CT MAXILLOFACIAL WITHOUT CONTRAST CT CERVICAL SPINE WITHOUT CONTRAST CT ABDOMEN AND PELVIS WITHOUT CONTRAST TECHNIQUE: Contiguous axial images were obtained from the base of the skull through the vertex without intravenous contrast. Multidetector CT imaging of the maxillofacial structures was performed.  Multiplanar CT image reconstructions were also generated. A small metallic BB was placed on the right temple in order to reliably differentiate right from left. Multidetector CT imaging of the cervical spine was performed without intravenous contrast. Multiplanar CT image reconstructions were also generated. COMPARISON:  CT head and cervical spine 01/09/2015 FINDINGS: CT HEAD FINDINGS Brain: Since prior head CT 01/09/2015 there has been interval increase in size of a partially calcified meningioma with both hyperdense soft tissue components and calcified components. This now measures 2.1 x 1.6 x 2.4 cm (previously 1.5 cm). There is small volume acute subarachnoid hemorrhage along the anterior left frontal lobe, adjacent to meningioma. A small mixed  density left subdural collection is also questioned overlying the anterior left frontal lobe, measuring 5 mm in thickness (series 3, image 14). Additional scattered supratentorial small volume acute subarachnoid hemorrhage, most notably along the lateral right temporal lobe (series 3, image 13). Abnormal cortical/subcortical hypodensity throughout much of the left PCA vascular territory within the left temporal and occipital lobes consistent with infarct, likely early subacute. Associated curvilinear hyperdensity at this site may reflect petechial hemorrhage or additional small volume acute subarachnoid hemorrhage. Redemonstrated small chronic focus of encephalomalacia within the anterolateral right frontal lobe. No midline shift. Ill-defined hypoattenuation within the cerebral white matter is nonspecific, but consistent with chronic small vessel ischemic disease. Moderate generalized parenchymal atrophy. Calcifications redemonstrated within the basal ganglia and cerebellar white matter. Vascular: No definite hyperdense vessel. Atherosclerotic calcifications. Skull: Normal. Negative for fracture or focal lesion. Other: Right mastoid effusion. Redemonstrated left suboccipital scalp lesion measuring 2.4 cm, likely benign CT MAXILLOFACIAL FINDINGS Osseous: Non-displaced fracture of the left nasal bone/frontal process of maxilla. No other maxillofacial fracture is identified. Orbits: No acute abnormality. Sinuses: Mild ethmoid sinus mucosal thickening. Soft tissues: Swelling of the left frontotemporal scalp, forehead and left maxillofacial soft tissues. CT CERVICAL SPINE FINDINGS Alignment: Reversal of the expected cervical lordosis. No significant spondylolisthesis. Skull base and vertebrae: The basion-dental and atlanto-dental intervals are maintained.No evidence of acute fracture to the cervical spine. Soft tissues and spinal canal: No prevertebral fluid or swelling. No visible canal hematoma. Atherosclerotic  disease. Disc levels: Congenital nonunion of the posterior arch of C1. Redemonstrated severe cervical spondylosis with multilevel disc degeneration, posterior disc osteophytes, uncovertebral and facet hypertrophy. Redemonstrated degenerative fusion across the C5-C6 disc space and of the right sided articular pillars at this level. There may also be fusion across the C6-C7 disc space. Degenerative fusion of the C7 and T1 articular pillars on the right. Upper chest: Imaged lung apices clear.  No visible pneumothorax. These results were called by telephone at the time of interpretation on 07/15/2019 at 2:03 pm to provider [facility] , who verbally acknowledged these results. IMPRESSION: CT head: 1. A partially calcified left frontal meningioma has increased in size since head CT 01/09/2015, now 2.1 cm. 2. Small volume acute subarachnoid hemorrhage along the anterior left frontal lobe, adjacent to the meningioma. Additional scattered small volume acute supratentorial subarachnoid hemorrhage, greatest along the lateral right temporal lobe. 3. A small mixed density subdural hematoma is also questioned overlying the anterior left frontal lobe, 5 mm in thickness. 4. Infarction change throughout much of the left PCA vascular territory, likely early subacute. Associated gyriform hyperdensity at this site may reflect petechial hemorrhage or additional small volume acute subarachnoid hemorrhage. 5. Chronic encephalomalacia within the anterolateral right frontal lobe. 6. Generalized parenchymal atrophy and chronic small vessel ischemic disease. 7. Right mastoid effusion. CT maxillofacial: 1. Nondisplaced fracture of  the left nasal bone/frontal process of maxilla. 2. Left frontotemporal scalp, forehead and left maxillofacial soft tissue swelling. 3. Mild ethmoid sinus mucosal thickening. CT cervical spine: 1. No evidence of acute fracture to the cervical spine. 2. Redemonstrated advanced cervical spondylosis with multiple  sites of degenerative fusion, as detailed. Electronically Signed   By: Kellie Simmering DO   On: 07/15/2019 14:14   DG Chest Port 1 View  Result Date: 07/15/2019 CLINICAL DATA:  Seizure EXAM: PORTABLE CHEST 1 VIEW COMPARISON:  03/15/2017 FINDINGS: Heart size appears within normal limits. Calcified thoracic aorta. Lung volumes are low. No focal airspace consolidation, pleural effusion, or pneumothorax. Osseous structures appear diffusely demineralized. Partially visualized ORIF hardware of the proximal left humerus. Severe degenerative changes of the shoulders. Linear lucency through the posterosuperior aspect of the right scapula suspicious for fracture. IMPRESSION: 1. Linear lucency through the posterosuperior aspect of the right scapula suspicious for fracture. 2. No acute cardiopulmonary findings. Electronically Signed   By: Davina Poke M.D.   On: 07/15/2019 12:35   ECHOCARDIOGRAM COMPLETE  Result Date: 07/15/2019   ECHOCARDIOGRAM REPORT   Patient Name:   JERRYE GOLDEN Date of Exam: 07/15/2019 Medical Rec #:  [id number]        Height:       66.0 in Accession #:    [id number]       Weight:       118.0 lb Date of Birth:  1918/12/11       BSA:          1.60 m Patient Age:    12 years         BP:           156/54 mmHg Patient Gender: F                HR:           71 bpm. Exam Location:  Inpatient Procedure: 2D Echo, Color Doppler and Cardiac Doppler Indications:    Stroke  History:        Patient has no prior history of Echocardiogram examinations.                 Stroke; Risk Factors:Hypertension and Dyslipidemia.  Sonographer:    Raquel Sarna Senior RDCS Referring Phys: [id number] [place]  1. Left ventricular ejection fraction, by visual estimation, is 65 to 70%. The left ventricle has normal function. Left ventricular septal wall thickness was severely increased. Severely increased left ventricular posterior wall thickness. There is no left ventricular hypertrophy.  2. Left ventricular  diastolic parameters are consistent with Grade I diastolic dysfunction (impaired relaxation).  3. The left ventricle has no regional wall motion abnormalities.  4. Global right ventricle has normal systolic function.The right ventricular size is normal. No increase in right ventricular wall thickness.  5. Left atrial size was normal.  6. Right atrial size was normal.  7. Mild thickening of the anterior and posterior mitral valve leaflet(s).  8. Moderate mitral annular calcification.  9. The mitral valve is normal in structure. No evidence of mitral valve regurgitation. No evidence of mitral stenosis. 10. The tricuspid valve is normal in structure. Tricuspid valve regurgitation is not demonstrated. 11. The aortic valve is tricuspid. Aortic valve regurgitation is not visualized. No evidence of aortic valve sclerosis or stenosis. 12. The pulmonic valve was normal in structure. Pulmonic valve regurgitation is not visualized. 13. TR signal is inadequate for assessing pulmonary artery systolic pressure. 14. The inferior vena cava  is normal in size with greater than 50% respiratory variability, suggesting right atrial pressure of 3 mmHg. FINDINGS  Left Ventricle: Left ventricular ejection fraction, by visual estimation, is 65 to 70%. The left ventricle has normal function. The left ventricle has no regional wall motion abnormalities. Severely increased left ventricular posterior wall thickness. There is no left ventricular hypertrophy. Left ventricular diastolic parameters are consistent with Grade I diastolic dysfunction (impaired relaxation). Right Ventricle: The right ventricular size is normal. No increase in right ventricular wall thickness. Global RV systolic function is has normal systolic function. Left Atrium: Left atrial size was normal in size. Right Atrium: Right atrial size was normal in size Pericardium: There is no evidence of pericardial effusion. Mitral Valve: The mitral valve is normal in structure. There  is mild thickening of the anterior and posterior mitral valve leaflet(s). Moderate mitral annular calcification. No evidence of mitral valve regurgitation. No evidence of mitral valve stenosis by observation. Tricuspid Valve: The tricuspid valve is normal in structure. Tricuspid valve regurgitation is not demonstrated. Aortic Valve: The aortic valve is tricuspid. . There is moderate thickening of the aortic valve. Aortic valve regurgitation is not visualized. The aortic valve is structurally normal, with no evidence of sclerosis or stenosis. There is moderate thickening of the aortic valve. Pulmonic Valve: The pulmonic valve was normal in structure. Pulmonic valve regurgitation is not visualized. Pulmonic regurgitation is not visualized. Aorta: The aortic root, ascending aorta and aortic arch are all structurally normal, with no evidence of dilitation or obstruction. Venous: The inferior vena cava is normal in size with greater than 50% respiratory variability, suggesting right atrial pressure of 3 mmHg. IAS/Shunts: No atrial level shunt detected by color flow Doppler. There is no evidence of a patent foramen ovale. No ventricular septal defect is seen or detected. There is no evidence of an atrial septal defect.  LEFT VENTRICLE PLAX 2D LVIDd:         2.09 cm LVIDs:         1.36 cm LV PW:         1.46 cm LV IVS:        1.55 cm LVOT diam:     1.70 cm LV SV:         10 ml LV SV Index:   6.08 LVOT Area:     2.27 cm  RIGHT VENTRICLE             IVC RV Basal diam:  1.95 cm     IVC diam: 0.65 cm RV S prime:     10.70 cm/s TAPSE (M-mode): 1.0 cm LEFT ATRIUM           Index LA diam:      3.40 cm 2.13 cm/m LA Vol (A2C): 27.0 ml 16.89 ml/m LA Vol (A4C): 38.4 ml 24.02 ml/m  AORTIC VALVE LVOT Vmax:   122.00 cm/s LVOT Vmean:  81.900 cm/s LVOT VTI:    0.252 m  AORTA Ao Root diam: 2.80 cm Ao Asc diam:  2.80 cm  SHUNTS Systemic VTI:  0.25 m Systemic Diam: 1.70 cm  Fransico Him MD Electronically signed by Fransico Him MD  Signature Date/Time: 07/15/2019/3:59:04 PM    Final    CT Maxillofacial Wo Contrast  Result Date: 07/15/2019 CLINICAL DATA:  Seizure, nontraumatic, facial trauma. EXAM: CT HEAD WITHOUT CONTRAST CT MAXILLOFACIAL WITHOUT CONTRAST CT CERVICAL SPINE WITHOUT CONTRAST CT ABDOMEN AND PELVIS WITHOUT CONTRAST TECHNIQUE: Contiguous axial images were obtained from the base of the skull through the  vertex without intravenous contrast. Multidetector CT imaging of the maxillofacial structures was performed. Multiplanar CT image reconstructions were also generated. A small metallic BB was placed on the right temple in order to reliably differentiate right from left. Multidetector CT imaging of the cervical spine was performed without intravenous contrast. Multiplanar CT image reconstructions were also generated. COMPARISON:  CT head and cervical spine 01/09/2015 FINDINGS: CT HEAD FINDINGS Brain: Since prior head CT 01/09/2015 there has been interval increase in size of a partially calcified meningioma with both hyperdense soft tissue components and calcified components. This now measures 2.1 x 1.6 x 2.4 cm (previously 1.5 cm). There is small volume acute subarachnoid hemorrhage along the anterior left frontal lobe, adjacent to meningioma. A small mixed density left subdural collection is also questioned overlying the anterior left frontal lobe, measuring 5 mm in thickness (series 3, image 14). Additional scattered supratentorial small volume acute subarachnoid hemorrhage, most notably along the lateral right temporal lobe (series 3, image 13). Abnormal cortical/subcortical hypodensity throughout much of the left PCA vascular territory within the left temporal and occipital lobes consistent with infarct, likely early subacute. Associated curvilinear hyperdensity at this site may reflect petechial hemorrhage or additional small volume acute subarachnoid hemorrhage. Redemonstrated small chronic focus of encephalomalacia within  the anterolateral right frontal lobe. No midline shift. Ill-defined hypoattenuation within the cerebral white matter is nonspecific, but consistent with chronic small vessel ischemic disease. Moderate generalized parenchymal atrophy. Calcifications redemonstrated within the basal ganglia and cerebellar white matter. Vascular: No definite hyperdense vessel. Atherosclerotic calcifications. Skull: Normal. Negative for fracture or focal lesion. Other: Right mastoid effusion. Redemonstrated left suboccipital scalp lesion measuring 2.4 cm, likely benign CT MAXILLOFACIAL FINDINGS Osseous: Non-displaced fracture of the left nasal bone/frontal process of maxilla. No other maxillofacial fracture is identified. Orbits: No acute abnormality. Sinuses: Mild ethmoid sinus mucosal thickening. Soft tissues: Swelling of the left frontotemporal scalp, forehead and left maxillofacial soft tissues. CT CERVICAL SPINE FINDINGS Alignment: Reversal of the expected cervical lordosis. No significant spondylolisthesis. Skull base and vertebrae: The basion-dental and atlanto-dental intervals are maintained.No evidence of acute fracture to the cervical spine. Soft tissues and spinal canal: No prevertebral fluid or swelling. No visible canal hematoma. Atherosclerotic disease. Disc levels: Congenital nonunion of the posterior arch of C1. Redemonstrated severe cervical spondylosis with multilevel disc degeneration, posterior disc osteophytes, uncovertebral and facet hypertrophy. Redemonstrated degenerative fusion across the C5-C6 disc space and of the right sided articular pillars at this level. There may also be fusion across the C6-C7 disc space. Degenerative fusion of the C7 and T1 articular pillars on the right. Upper chest: Imaged lung apices clear.  No visible pneumothorax. These results were called by telephone at the time of interpretation on 07/15/2019 at 2:03 pm to provider [employer] , who verbally acknowledged these results.  IMPRESSION: CT head: 1. A partially calcified left frontal meningioma has increased in size since head CT 01/09/2015, now 2.1 cm. 2. Small volume acute subarachnoid hemorrhage along the anterior left frontal lobe, adjacent to the meningioma. Additional scattered small volume acute supratentorial subarachnoid hemorrhage, greatest along the lateral right temporal lobe. 3. A small mixed density subdural hematoma is also questioned overlying the anterior left frontal lobe, 5 mm in thickness. 4. Infarction change throughout much of the left PCA vascular territory, likely early subacute. Associated gyriform hyperdensity at this site may reflect petechial hemorrhage or additional small volume acute subarachnoid hemorrhage. 5. Chronic encephalomalacia within the anterolateral right frontal lobe. 6. Generalized parenchymal atrophy and chronic small  vessel ischemic disease. 7. Right mastoid effusion. CT maxillofacial: 1. Nondisplaced fracture of the left nasal bone/frontal process of maxilla. 2. Left frontotemporal scalp, forehead and left maxillofacial soft tissue swelling. 3. Mild ethmoid sinus mucosal thickening. CT cervical spine: 1. No evidence of acute fracture to the cervical spine. 2. Redemonstrated advanced cervical spondylosis with multiple sites of degenerative fusion, as detailed. Electronically Signed   By: Kellie Simmering DO   On: 07/15/2019 14:14    PERFORMANCE STATUS (ECOG) : 4 - Bedbound  Review of Systems Unable to complete  Physical Exam General: Ill-appearing Pulmonary: Unlabored Extremities: no edema, no joint deformities Skin: no rashes Neurological: Poorly responsive  IMPRESSION: Patient is poorly responsive.  She is comfortable appearing at present.  Son has requested that Cymbalta and other potentially sedating medications be held to more accurately assess her neurological status.  It is possible that her baseline mentation may be poor following recent stroke.  I called and spoke with  patient's son by phone.  He confirmed that his primary goal is to ensure the patient is comfortable.  We discussed the differences between palliative care and hospice in detail.  He verbalized agreement with hospice following after discharge.  PLAN: -Best supportive care -Agree with hospice at ALF -DNR/DNI   Time Total: 30 minutes  Visit consisted of counseling and education dealing with the complex and emotionally intense issues of symptom management and palliative care in the setting of serious and potentially life-threatening illness.Greater than 50%  of this time was spent counseling and coordinating care related to the above assessment and plan.  Signed by: Altha Harm, PhD, NP-C

## 2019-07-17 NOTE — Progress Notes (Signed)
  Speech Language Pathology Treatment: Dysphagia  Patient Details Name: Amber Fowler MRN: [id number] DOB: 1918/09/28 Today's Date: 07/17/2019 Time: 1200-1208 SLP Time Calculation (min) (ACUTE ONLY): 8 min  Assessment / Plan / Recommendation Clinical Impression  Pt was seen by ST for dysphagia treatment and to monitor diet tolerance of Dysphagia 1 (puree) solids and thin liquids.  RN reported that pt had great po intake during breakfast and that she was tolerating her current diet without difficulty.  Pt was lethargic and her eyes remained closed throughout this tx session; however, when straw or spoon was presented to her oral cavity, the pt exhibited excellent bolus acceptance with timely AP transport and suspected timely swallow initiation.  Pt was able to independently draw liquid from the straw when it was brought to her lips and she did not exhibit any clinical s/sx of aspiration with puree or thin liquid trials.  No oral residue was observed with any trials. Briefly spoke with son regarding current diet recommendations and he verbalized understanding.  Additionally, spoke with MD who stated that son wanted to pursue comfort care for the pt.  Due to edentulism and cognitive deficits,  Dysphagia 1 (puree) solids and thin liquids will likely be the safest long-term diet for the pt with full supervision to assist with po intake and to cue for compensatory strategies.  No further skilled ST is warranted at this time.  Please re-consult if additional needs arise.     HPI HPI: Amber Fowler is a 85 y.o. female with medical history significant of hypertension advanced dementia (baseline nonverbal), chronic ambulatory dysfunction/chair bound, sent from home by her caregiver/24-hour home care nurse, after nosebleeding probably from a unwitnessed fall.  EMS noticed patient has a left forehead hematoma.  According to home care nurse, and has been weaker over the last 2 to 3 days, and has been lethargic.  Head CT on 12/18 with multiple finding including 1. "A partially calcified left frontal meningioma", 2. "Small volume acute subarachnoid hemorrhage along the anterior left frontal lobe, adjacent to the meningioma. Additional scattered small volume acute supratentorial subarachnoid hemorrhage, greatest along the lateral right temporal lobe." 3. "A small mixed density subdural hematoma is also questioned overlying the anterior left frontal lobe" 4. "Infarction change throughout much of the left PCA vascular territory, likely early subacute."  Pt with hx of dysphagia with most recent recommendations for Dysphagia 1 (puree) solids and thin liquid.        SLP Plan  All goals met       Recommendations  Diet recommendations: Dysphagia 1 (puree);Thin liquid Liquids provided via: Cup;Straw Medication Administration: Crushed with puree Supervision: Trained caregiver to feed patient;Full supervision/cueing for compensatory strategies Compensations: Slow rate;Small sips/bites Postural Changes and/or Swallow Maneuvers: Seated upright 90 degrees                Oral Care Recommendations: Oral care BID;Staff/trained caregiver to provide oral care Follow up Recommendations: Skilled Nursing facility;24 hour supervision/assistance SLP Visit Diagnosis: Dysphagia, unspecified (R13.10) Plan: All goals met       GO               Colin Mulders., M.S., [facility] [facility] Office: [phone]  [place] 07/17/2019, 1:01 PM

## 2019-07-18 ENCOUNTER — Telehealth: Payer: Self-pay

## 2019-07-18 MED ORDER — LEVOTHYROXINE SODIUM 50 MCG PO TABS: 50.0000 ug | ORAL_TABLET | Freq: Every day | ORAL | 0 refills | Status: AC

## 2019-07-18 MED ORDER — ACETAMINOPHEN 325 MG PO TABS: 650.0000 mg | ORAL_TABLET | ORAL | 1 refills | Status: AC | PRN

## 2019-07-18 MED ORDER — CHLORHEXIDINE GLUCONATE CLOTH 2 % EX PADS: 6.0000 | MEDICATED_PAD | Freq: Every day | CUTANEOUS | 0 refills | Status: AC

## 2019-07-18 NOTE — Care Management Important Message (Signed)
Important Message  Patient Details  Name: Amber Fowler MRN: [id number] Date of Birth: 05-18-1919   Medicare Important Message Given:  Yes  Due to illness patient was not able to sign.  I sign a copy and left it at the patient bedside.    [name] 07/18/2019, 12:35 PM

## 2019-07-18 NOTE — TOC Transition Note (Signed)
Transition of Care [facility]) - CM/SW Discharge Note   Patient Details  Name: Amber Fowler MRN: [id number] Date of Birth: 1918/12/23  Transition of Care [facility]) CM/SW Contact:  [name], LCSW Phone Number: 07/18/2019, 3:13 PM   Clinical Narrative:   Nurse to call report to [phone].    Final next level of care: Assisted Living Barriers to Discharge: Barriers Resolved   Patient Goals and CMS Choice Patient states their goals for this hospitalization and ongoing recovery are:: patient unable to state goals, son would like to keep her comfortable CMS Medicare.gov Compare Post Acute Care list provided to:: Patient Represenative (must comment) Choice offered to / list presented to : Adult Children  Discharge Placement              Patient chooses bed at: [facility] Patient to be transferred to facility by: [place] Name of family member notified: [name], son Patient and family notified of of transfer: 07/18/19  Discharge Plan and Services     Post Acute Care Choice: Hospice                               Social Determinants of Health (SDOH) Interventions     Readmission Risk Interventions No flowsheet data found.

## 2019-07-18 NOTE — Telephone Encounter (Signed)
Received notification from [place], nurse at [facility], that patient is discharging back to [facility] with hospice care. Primary Palliative NP updated

## 2019-07-18 NOTE — Progress Notes (Signed)
Patient is discharging to [facility] with [facility]. [facility] called for transport. All discharge paperwork sent with [facility]. All Patient belongings sent with [facility]. IV taken out. [facility] called with report. Family notified. [place]

## 2019-07-18 NOTE — NC FL2 (Signed)
[place] MEDICAID FL2 LEVEL OF CARE SCREENING TOOL     IDENTIFICATION  Patient Name: Amber Fowler Birthdate: Mar 03, 1919 Sex: female Admission Date (Current Location): 07/15/2019  [facility] and [place] Number:  Herbalist and Address:  [facility] [place] [place] [address] [place], [place] [address]      Provider Number: [id number]  Attending Physician Name and Address:  Deatra James, MD  Relative Name and Phone Number:       Current Level of Care: Hospital Recommended Level of Care: [facility] Prior Approval Number:    Date Approved/Denied:   PASRR Number:    Discharge Plan: Other (Comment)(ALF)    Current Diagnoses: Patient Active Problem List   Diagnosis Date Noted  . SAH (subarachnoid hemorrhage) ([place]) 07/15/2019  . Subdural hematoma due to concussion ([place]) 07/15/2019  . Stroke (cerebrum) ([place]) 07/15/2019  . AMS (altered mental status) 07/15/2019  . Palliative care encounter 12/09/2017  . Closed nondisplaced fracture of coronoid process of right ulna 08/21/2016  . Humerus fracture   . Acute blood loss anemia   . Hip fracture requiring operative repair ([place]) 01/10/2015  . Hypertension 01/09/2015  . Hypothyroid 01/09/2015  . Alzheimer disease ([place]) 01/09/2015  . Left displaced femoral neck fracture ([place]) 01/09/2015    Orientation RESPIRATION BLADDER Height & Weight     (disoriented)  Normal Incontinent Weight:   Height:     BEHAVIORAL SYMPTOMS/MOOD NEUROLOGICAL BOWEL NUTRITION STATUS      Incontinent Diet(puree solids, thin liquids)  AMBULATORY STATUS COMMUNICATION OF NEEDS Skin   Total Care Verbally Normal                       Personal Care Assistance Level of Assistance  Bathing, Feeding, Dressing Bathing Assistance: Maximum assistance Feeding assistance: Maximum assistance Dressing Assistance: Maximum assistance     Functional Limitations Info  Speech     Speech Info: Impaired     SPECIAL CARE FACTORS FREQUENCY                       Contractures Contractures Info: Not present    Additional Factors Info  Code Status, Allergies Code Status Info: DNR Allergies Info: NKA           Current Medications (07/18/2019):  This is the current hospital active medication list Current Facility-Administered Medications  Medication Dose Route Frequency Provider Last Rate Last Admin  .  stroke: mapping our early stages of recovery book   Does not apply Once Lequita Halt, MD   Stopped at 07/15/19 1640  . acetaminophen (TYLENOL) tablet 650 mg  650 mg Oral Q4H PRN Wynetta Fines T, MD       Or  . acetaminophen (TYLENOL) 160 MG/5ML solution 650 mg  650 mg Per Tube Q4H PRN Wynetta Fines T, MD       Or  . acetaminophen (TYLENOL) suppository 650 mg  650 mg Rectal Q4H PRN Lequita Halt, MD      . Chlorhexidine Gluconate Cloth 2 % PADS 6 each  6 each Topical Daily Lequita Halt, MD   6 each at 07/18/19 1001  . latanoprost (XALATAN) 0.005 % ophthalmic solution 1 drop  1 drop Both Eyes QHS Lequita Halt, MD   1 drop at 07/17/19 2133  . levothyroxine (SYNTHROID) tablet 50 mcg  50 mcg Oral QAC breakfast Wynetta Fines T, MD   50 mcg at 07/18/19 0503  .  senna-docusate (Senokot-S) tablet 1 tablet  1 tablet Oral QHS PRN Lequita Halt, MD         Discharge Medications: STOP taking these medications   ALPRAZolam 0.25 MG tablet Commonly known as: XANAX   ASPERCREME MAX STRENGTH EX   DULoxetine 20 MG capsule Commonly known as: CYMBALTA   lisinopril 20 MG tablet Commonly known as: ZESTRIL   Vitamin D 50 MCG (2000 UT) Caps     TAKE these medications   acetaminophen 325 MG tablet Commonly known as: TYLENOL Take 2 tablets (650 mg total) by mouth every 4 (four) hours as needed for mild pain (or temp > 37.5 C (99.5 F)).   BAZA PROTECT EX Apply 1 application topically See admin instructions. Apply to buttocks and coccyx with every brief change.   Chlorhexidine  Gluconate Cloth 2 % Pads Apply 6 each topically daily. Start taking on: July 19, 2019   latanoprost 0.005 % ophthalmic solution Commonly known as: XALATAN Place 1 drop into both eyes at bedtime.   levothyroxine 50 MCG tablet Commonly known as: SYNTHROID Take 1 tablet (50 mcg total) by mouth daily before breakfast. Start taking on: July 19, 2019 What changed:   how much to take  when to take this   Mineral Oil Oil Place 2 drops into both ears once a week.   polyethylene glycol 17 g packet Commonly known as: MIRALAX / GLYCOLAX Take 17 g by mouth daily. Hold for diarrhea   Systane Ultra 0.4-0.3 % Soln Generic drug: Polyethyl Glycol-Propyl Glycol Place 1 drop into both eyes 3 (three) times daily.     Relevant Imaging Results:  Relevant Lab Results:   Additional Information SS#: [id number]  [name],

## 2019-07-18 NOTE — Discharge Summary (Signed)
Physician Discharge Summary [facility]    Patient: Amber Fowler                   Admit date: 07/15/2019   DOB: 12-Nov-1918             Discharge date:07/18/2019/11:09 AM [id number]                          [facility]  Disposition: Will be discharged to assisted living with hospice  Recommendations for Outpatient Follow-up:   . Follow up: Follow-up with hospice ASAP  Discharge Condition: Stable   Code Status:   Code Status: DNR  Diet recommendation: As tolerated with high risk of aspiration, family understanding   Discharge Diagnoses:    Active Problems:   SAH (subarachnoid hemorrhage) (HCC)   Subdural hematoma due to concussion [facility])   Stroke (cerebrum) ([place])   AMS (altered mental status)   History of Present Illness/ Hospital Course Kathleen Argue Summary:   Amber Fowler is a 85 y.o.femalewith medical history significant ofhypertension advanced dementia(baseline nonverbal),chronic ambulatory dysfunction/chair bound,sent from [facility] facility by her caregiver after being found with nose bleed and hematoma on her forehead probablyfrom a unwitnessed fall. EMSnoticed patient has a left forehead hematoma.According to home care nurse,and has been weaker over the last 2 to 3 days,and has been lethargic.  In the ED: CT of the head showed a subacute left PCA stroke, small volume acute subarachnoid hemorrhage along the anterior left frontal lobe, additional scattered small volume acute supratentorial subarachnoid hemorrhage greatest along the lateral right temporal lobe, small mixed density subdural hematoma also questioned overlying the anterior left lobe, 5 mm in thickness.  Partially calcified left frontal meningioma increased in size since 01/09/2015 to 2.1 cm.  Chronic encephalomalacia with anterior lateral right frontal lobe Neurology consult was requested part was likely atheroembolic versus cardioembolic.  The family decided to make  the patient DO NOT RESUSCITATE and planned to place her under hospice care.  Subjective: The patient was seen and examined this morning, No complaints. Confused.      Assessment & Plan:   Active Problems:   SAH (subarachnoid hemorrhage)    Subdural hematoma due to concussion    Stroke (cerebrum)   -No changes -As mentioned above, she is comfort care-speech therapy has evaluated her and the patient is able to eat and drink and therefore diet has been ordered  - I have spoken with her son, Dr Carlis Abbott,  today on 07/18/2019 and he is hopeful to transfer her back to [place] with private sitters- she had a daytime sitter prior to coming into the hospital and he will try to arrange for a nighttime sitter as well- he does confirm that he wants comfort care - she is not a candidate for [facility] as she is still eating and drinking.   Dementia with superimposed acute confusion related to above neurological injuries -The patient is quite confused and speech does not make any sense-follow for behavioral disturbances-at this time she does not appear to require a sitter - later in the day today, she was noted to be more lethargic- ? If bleeds are extending- her son also states that Cymbalta makes her sleepy and is asking if we can hold it to see if she will awaken thus have held both Cymbalta and PRN Xanax (she did not receive any Xanax thus far)  Nondisplaced fractures of left nasal bone/frontal process  of maxilla -This is likely a result of the fall and also the cause of her nosebleed which has stopped -Conservative management -remained stable  Hypertension -Due to acute CVA, permissive hypertension was allowed  DVT prophylaxis: SCDs Code Status: DO NOT RESUSCITATE Family Communication: son, Dr Jeanann Lewandowsky -was updated Disposition Plan: return to [place] with private sitters -with hospice care today Consultants:   Neurology        Discharge Instructions:    Discharge Instructions    Activity as tolerated - No restrictions   Complete by: As directed    Diet - low sodium heart healthy   Complete by: As directed    Discharge instructions   Complete by: As directed    Please follow-up with hospice, hospice recommendation and treatment   Increase activity slowly   Complete by: As directed        Medication List    STOP taking these medications   ALPRAZolam 0.25 MG tablet Commonly known as: XANAX   ASPERCREME MAX STRENGTH EX   DULoxetine 20 MG capsule Commonly known as: CYMBALTA   lisinopril 20 MG tablet Commonly known as: ZESTRIL   Vitamin D 50 MCG (2000 UT) Caps     TAKE these medications   acetaminophen 325 MG tablet Commonly known as: TYLENOL Take 2 tablets (650 mg total) by mouth every 4 (four) hours as needed for mild pain (or temp > 37.5 C (99.5 F)).   BAZA PROTECT EX Apply 1 application topically See admin instructions. Apply to buttocks and coccyx with every brief change.   Chlorhexidine Gluconate Cloth 2 % Pads Apply 6 each topically daily. Start taking on: July 19, 2019   latanoprost 0.005 % ophthalmic solution Commonly known as: XALATAN Place 1 drop into both eyes at bedtime.   levothyroxine 50 MCG tablet Commonly known as: SYNTHROID Take 1 tablet (50 mcg total) by mouth daily before breakfast. Start taking on: July 19, 2019 What changed:   how much to take  when to take this   Mineral Oil Oil Place 2 drops into both ears once a week.   polyethylene glycol 17 g packet Commonly known as: MIRALAX / GLYCOLAX Take 17 g by mouth daily. Hold for diarrhea   Systane Ultra 0.4-0.3 % Soln Generic drug: Polyethyl Glycol-Propyl Glycol Place 1 drop into both eyes 3 (three) times daily.       No Known Allergies   Procedures /Studies:   DG Pelvis 1-2 Views  Result Date: 07/15/2019 CLINICAL DATA:  Seizure.  Nonverbal. EXAM: PELVIS - 1-2 VIEW COMPARISON:  01/10/2015 FINDINGS: The right hip  prosthesis is normally located. No complicating features. The right hip is also normally located. No right hip fracture. The pubic symphysis and SI joints are intact. No definite pelvic fractures. IMPRESSION: No acute bony findings. Electronically Signed   By: Marijo Sanes M.D.   On: 07/15/2019 12:35   CT HEAD WO CONTRAST  Result Date: 07/15/2019 CLINICAL DATA:  Seizure, nontraumatic, facial trauma. EXAM: CT HEAD WITHOUT CONTRAST CT MAXILLOFACIAL WITHOUT CONTRAST CT CERVICAL SPINE WITHOUT CONTRAST CT ABDOMEN AND PELVIS WITHOUT CONTRAST TECHNIQUE: Contiguous axial images were obtained from the base of the skull through the vertex without intravenous contrast. Multidetector CT imaging of the maxillofacial structures was performed. Multiplanar CT image reconstructions were also generated. A small metallic BB was placed on the right temple in order to reliably differentiate right from left. Multidetector CT imaging of the cervical spine was performed without intravenous contrast. Multiplanar CT image reconstructions  were also generated. COMPARISON:  CT head and cervical spine 01/09/2015 FINDINGS: CT HEAD FINDINGS Brain: Since prior head CT 01/09/2015 there has been interval increase in size of a partially calcified meningioma with both hyperdense soft tissue components and calcified components. This now measures 2.1 x 1.6 x 2.4 cm (previously 1.5 cm). There is small volume acute subarachnoid hemorrhage along the anterior left frontal lobe, adjacent to meningioma. A small mixed density left subdural collection is also questioned overlying the anterior left frontal lobe, measuring 5 mm in thickness (series 3, image 14). Additional scattered supratentorial small volume acute subarachnoid hemorrhage, most notably along the lateral right temporal lobe (series 3, image 13). Abnormal cortical/subcortical hypodensity throughout much of the left PCA vascular territory within the left temporal and occipital lobes consistent  with infarct, likely early subacute. Associated curvilinear hyperdensity at this site may reflect petechial hemorrhage or additional small volume acute subarachnoid hemorrhage. Redemonstrated small chronic focus of encephalomalacia within the anterolateral right frontal lobe. No midline shift. Ill-defined hypoattenuation within the cerebral white matter is nonspecific, but consistent with chronic small vessel ischemic disease. Moderate generalized parenchymal atrophy. Calcifications redemonstrated within the basal ganglia and cerebellar white matter. Vascular: No definite hyperdense vessel. Atherosclerotic calcifications. Skull: Normal. Negative for fracture or focal lesion. Other: Right mastoid effusion. Redemonstrated left suboccipital scalp lesion measuring 2.4 cm, likely benign CT MAXILLOFACIAL FINDINGS Osseous: Non-displaced fracture of the left nasal bone/frontal process of maxilla. No other maxillofacial fracture is identified. Orbits: No acute abnormality. Sinuses: Mild ethmoid sinus mucosal thickening. Soft tissues: Swelling of the left frontotemporal scalp, forehead and left maxillofacial soft tissues. CT CERVICAL SPINE FINDINGS Alignment: Reversal of the expected cervical lordosis. No significant spondylolisthesis. Skull base and vertebrae: The basion-dental and atlanto-dental intervals are maintained.No evidence of acute fracture to the cervical spine. Soft tissues and spinal canal: No prevertebral fluid or swelling. No visible canal hematoma. Atherosclerotic disease. Disc levels: Congenital nonunion of the posterior arch of C1. Redemonstrated severe cervical spondylosis with multilevel disc degeneration, posterior disc osteophytes, uncovertebral and facet hypertrophy. Redemonstrated degenerative fusion across the C5-C6 disc space and of the right sided articular pillars at this level. There may also be fusion across the C6-C7 disc space. Degenerative fusion of the C7 and T1 articular pillars on the  right. Upper chest: Imaged lung apices clear.  No visible pneumothorax. These results were called by telephone at the time of interpretation on 07/15/2019 at 2:03 pm to provider [facility] , who verbally acknowledged these results. IMPRESSION: CT head: 1. A partially calcified left frontal meningioma has increased in size since head CT 01/09/2015, now 2.1 cm. 2. Small volume acute subarachnoid hemorrhage along the anterior left frontal lobe, adjacent to the meningioma. Additional scattered small volume acute supratentorial subarachnoid hemorrhage, greatest along the lateral right temporal lobe. 3. A small mixed density subdural hematoma is also questioned overlying the anterior left frontal lobe, 5 mm in thickness. 4. Infarction change throughout much of the left PCA vascular territory, likely early subacute. Associated gyriform hyperdensity at this site may reflect petechial hemorrhage or additional small volume acute subarachnoid hemorrhage. 5. Chronic encephalomalacia within the anterolateral right frontal lobe. 6. Generalized parenchymal atrophy and chronic small vessel ischemic disease. 7. Right mastoid effusion. CT maxillofacial: 1. Nondisplaced fracture of the left nasal bone/frontal process of maxilla. 2. Left frontotemporal scalp, forehead and left maxillofacial soft tissue swelling. 3. Mild ethmoid sinus mucosal thickening. CT cervical spine: 1. No evidence of acute fracture to the cervical spine. 2. Redemonstrated advanced cervical  spondylosis with multiple sites of degenerative fusion, as detailed. Electronically Signed   By: Kellie Simmering DO   On: 07/15/2019 14:14   CT CERVICAL SPINE WO CONTRAST  Result Date: 07/15/2019 CLINICAL DATA:  Seizure, nontraumatic, facial trauma. EXAM: CT HEAD WITHOUT CONTRAST CT MAXILLOFACIAL WITHOUT CONTRAST CT CERVICAL SPINE WITHOUT CONTRAST CT ABDOMEN AND PELVIS WITHOUT CONTRAST TECHNIQUE: Contiguous axial images were obtained from the base of the skull through  the vertex without intravenous contrast. Multidetector CT imaging of the maxillofacial structures was performed. Multiplanar CT image reconstructions were also generated. A small metallic BB was placed on the right temple in order to reliably differentiate right from left. Multidetector CT imaging of the cervical spine was performed without intravenous contrast. Multiplanar CT image reconstructions were also generated. COMPARISON:  CT head and cervical spine 01/09/2015 FINDINGS: CT HEAD FINDINGS Brain: Since prior head CT 01/09/2015 there has been interval increase in size of a partially calcified meningioma with both hyperdense soft tissue components and calcified components. This now measures 2.1 x 1.6 x 2.4 cm (previously 1.5 cm). There is small volume acute subarachnoid hemorrhage along the anterior left frontal lobe, adjacent to meningioma. A small mixed density left subdural collection is also questioned overlying the anterior left frontal lobe, measuring 5 mm in thickness (series 3, image 14). Additional scattered supratentorial small volume acute subarachnoid hemorrhage, most notably along the lateral right temporal lobe (series 3, image 13). Abnormal cortical/subcortical hypodensity throughout much of the left PCA vascular territory within the left temporal and occipital lobes consistent with infarct, likely early subacute. Associated curvilinear hyperdensity at this site may reflect petechial hemorrhage or additional small volume acute subarachnoid hemorrhage. Redemonstrated small chronic focus of encephalomalacia within the anterolateral right frontal lobe. No midline shift. Ill-defined hypoattenuation within the cerebral white matter is nonspecific, but consistent with chronic small vessel ischemic disease. Moderate generalized parenchymal atrophy. Calcifications redemonstrated within the basal ganglia and cerebellar white matter. Vascular: No definite hyperdense vessel. Atherosclerotic calcifications.  Skull: Normal. Negative for fracture or focal lesion. Other: Right mastoid effusion. Redemonstrated left suboccipital scalp lesion measuring 2.4 cm, likely benign CT MAXILLOFACIAL FINDINGS Osseous: Non-displaced fracture of the left nasal bone/frontal process of maxilla. No other maxillofacial fracture is identified. Orbits: No acute abnormality. Sinuses: Mild ethmoid sinus mucosal thickening. Soft tissues: Swelling of the left frontotemporal scalp, forehead and left maxillofacial soft tissues. CT CERVICAL SPINE FINDINGS Alignment: Reversal of the expected cervical lordosis. No significant spondylolisthesis. Skull base and vertebrae: The basion-dental and atlanto-dental intervals are maintained.No evidence of acute fracture to the cervical spine. Soft tissues and spinal canal: No prevertebral fluid or swelling. No visible canal hematoma. Atherosclerotic disease. Disc levels: Congenital nonunion of the posterior arch of C1. Redemonstrated severe cervical spondylosis with multilevel disc degeneration, posterior disc osteophytes, uncovertebral and facet hypertrophy. Redemonstrated degenerative fusion across the C5-C6 disc space and of the right sided articular pillars at this level. There may also be fusion across the C6-C7 disc space. Degenerative fusion of the C7 and T1 articular pillars on the right. Upper chest: Imaged lung apices clear.  No visible pneumothorax. These results were called by telephone at the time of interpretation on 07/15/2019 at 2:03 pm to provider [facility] , who verbally acknowledged these results. IMPRESSION: CT head: 1. A partially calcified left frontal meningioma has increased in size since head CT 01/09/2015, now 2.1 cm. 2. Small volume acute subarachnoid hemorrhage along the anterior left frontal lobe, adjacent to the meningioma. Additional scattered small volume acute supratentorial subarachnoid  hemorrhage, greatest along the lateral right temporal lobe. 3. A small mixed density  subdural hematoma is also questioned overlying the anterior left frontal lobe, 5 mm in thickness. 4. Infarction change throughout much of the left PCA vascular territory, likely early subacute. Associated gyriform hyperdensity at this site may reflect petechial hemorrhage or additional small volume acute subarachnoid hemorrhage. 5. Chronic encephalomalacia within the anterolateral right frontal lobe. 6. Generalized parenchymal atrophy and chronic small vessel ischemic disease. 7. Right mastoid effusion. CT maxillofacial: 1. Nondisplaced fracture of the left nasal bone/frontal process of maxilla. 2. Left frontotemporal scalp, forehead and left maxillofacial soft tissue swelling. 3. Mild ethmoid sinus mucosal thickening. CT cervical spine: 1. No evidence of acute fracture to the cervical spine. 2. Redemonstrated advanced cervical spondylosis with multiple sites of degenerative fusion, as detailed. Electronically Signed   By: Kellie Simmering DO   On: 07/15/2019 14:14   DG Chest Port 1 View  Result Date: 07/15/2019 CLINICAL DATA:  Seizure EXAM: PORTABLE CHEST 1 VIEW COMPARISON:  03/15/2017 FINDINGS: Heart size appears within normal limits. Calcified thoracic aorta. Lung volumes are low. No focal airspace consolidation, pleural effusion, or pneumothorax. Osseous structures appear diffusely demineralized. Partially visualized ORIF hardware of the proximal left humerus. Severe degenerative changes of the shoulders. Linear lucency through the posterosuperior aspect of the right scapula suspicious for fracture. IMPRESSION: 1. Linear lucency through the posterosuperior aspect of the right scapula suspicious for fracture. 2. No acute cardiopulmonary findings. Electronically Signed   By: Davina Poke M.D.   On: 07/15/2019 12:35   ECHOCARDIOGRAM COMPLETE  Result Date: 07/15/2019   ECHOCARDIOGRAM REPORT   Patient Name:   VERDELL BASICH Date of Exam: 07/15/2019 Medical Rec #:  [id number]        Height:       66.0 in  Accession #:    [id number]       Weight:       118.0 lb Date of Birth:  July 27, 1919       BSA:          1.60 m Patient Age:    85 years         BP:           156/54 mmHg Patient Gender: F                HR:           71 bpm. Exam Location:  Inpatient Procedure: 2D Echo, Color Doppler and Cardiac Doppler Indications:    Stroke  History:        Patient has no prior history of Echocardiogram examinations.                 Stroke; Risk Factors:Hypertension and Dyslipidemia.  Sonographer:    Raquel Sarna Senior RDCS Referring Phys: [id number] [place]  1. Left ventricular ejection fraction, by visual estimation, is 65 to 70%. The left ventricle has normal function. Left ventricular septal wall thickness was severely increased. Severely increased left ventricular posterior wall thickness. There is no left ventricular hypertrophy.  2. Left ventricular diastolic parameters are consistent with Grade I diastolic dysfunction (impaired relaxation).  3. The left ventricle has no regional wall motion abnormalities.  4. Global right ventricle has normal systolic function.The right ventricular size is normal. No increase in right ventricular wall thickness.  5. Left atrial size was normal.  6. Right atrial size was normal.  7. Mild thickening of the anterior and posterior mitral valve leaflet(s).  8.  Moderate mitral annular calcification.  9. The mitral valve is normal in structure. No evidence of mitral valve regurgitation. No evidence of mitral stenosis. 10. The tricuspid valve is normal in structure. Tricuspid valve regurgitation is not demonstrated. 11. The aortic valve is tricuspid. Aortic valve regurgitation is not visualized. No evidence of aortic valve sclerosis or stenosis. 12. The pulmonic valve was normal in structure. Pulmonic valve regurgitation is not visualized. 13. TR signal is inadequate for assessing pulmonary artery systolic pressure. 14. The inferior vena cava is normal in size with greater than 50%  respiratory variability, suggesting right atrial pressure of 3 mmHg. FINDINGS  Left Ventricle: Left ventricular ejection fraction, by visual estimation, is 65 to 70%. The left ventricle has normal function. The left ventricle has no regional wall motion abnormalities. Severely increased left ventricular posterior wall thickness. There is no left ventricular hypertrophy. Left ventricular diastolic parameters are consistent with Grade I diastolic dysfunction (impaired relaxation). Right Ventricle: The right ventricular size is normal. No increase in right ventricular wall thickness. Global RV systolic function is has normal systolic function. Left Atrium: Left atrial size was normal in size. Right Atrium: Right atrial size was normal in size Pericardium: There is no evidence of pericardial effusion. Mitral Valve: The mitral valve is normal in structure. There is mild thickening of the anterior and posterior mitral valve leaflet(s). Moderate mitral annular calcification. No evidence of mitral valve regurgitation. No evidence of mitral valve stenosis by observation. Tricuspid Valve: The tricuspid valve is normal in structure. Tricuspid valve regurgitation is not demonstrated. Aortic Valve: The aortic valve is tricuspid. . There is moderate thickening of the aortic valve. Aortic valve regurgitation is not visualized. The aortic valve is structurally normal, with no evidence of sclerosis or stenosis. There is moderate thickening of the aortic valve. Pulmonic Valve: The pulmonic valve was normal in structure. Pulmonic valve regurgitation is not visualized. Pulmonic regurgitation is not visualized. Aorta: The aortic root, ascending aorta and aortic arch are all structurally normal, with no evidence of dilitation or obstruction. Venous: The inferior vena cava is normal in size with greater than 50% respiratory variability, suggesting right atrial pressure of 3 mmHg. IAS/Shunts: No atrial level shunt detected by color flow  Doppler. There is no evidence of a patent foramen ovale. No ventricular septal defect is seen or detected. There is no evidence of an atrial septal defect.  LEFT VENTRICLE PLAX 2D LVIDd:         2.09 cm LVIDs:         1.36 cm LV PW:         1.46 cm LV IVS:        1.55 cm LVOT diam:     1.70 cm LV SV:         10 ml LV SV Index:   6.08 LVOT Area:     2.27 cm  RIGHT VENTRICLE             IVC RV Basal diam:  1.95 cm     IVC diam: 0.65 cm RV S prime:     10.70 cm/s TAPSE (M-mode): 1.0 cm LEFT ATRIUM           Index LA diam:      3.40 cm 2.13 cm/m LA Vol (A2C): 27.0 ml 16.89 ml/m LA Vol (A4C): 38.4 ml 24.02 ml/m  AORTIC VALVE LVOT Vmax:   122.00 cm/s LVOT Vmean:  81.900 cm/s LVOT VTI:    0.252 m  AORTA Ao Root diam: 2.80  cm Ao Asc diam:  2.80 cm  SHUNTS Systemic VTI:  0.25 m Systemic Diam: 1.70 cm  Fransico Him MD Electronically signed by Fransico Him MD Signature Date/Time: 07/15/2019/3:59:04 PM    Final    CT Maxillofacial Wo Contrast  Result Date: 07/15/2019 CLINICAL DATA:  Seizure, nontraumatic, facial trauma. EXAM: CT HEAD WITHOUT CONTRAST CT MAXILLOFACIAL WITHOUT CONTRAST CT CERVICAL SPINE WITHOUT CONTRAST CT ABDOMEN AND PELVIS WITHOUT CONTRAST TECHNIQUE: Contiguous axial images were obtained from the base of the skull through the vertex without intravenous contrast. Multidetector CT imaging of the maxillofacial structures was performed. Multiplanar CT image reconstructions were also generated. A small metallic BB was placed on the right temple in order to reliably differentiate right from left. Multidetector CT imaging of the cervical spine was performed without intravenous contrast. Multiplanar CT image reconstructions were also generated. COMPARISON:  CT head and cervical spine 01/09/2015 FINDINGS: CT HEAD FINDINGS Brain: Since prior head CT 01/09/2015 there has been interval increase in size of a partially calcified meningioma with both hyperdense soft tissue components and calcified components. This  now measures 2.1 x 1.6 x 2.4 cm (previously 1.5 cm). There is small volume acute subarachnoid hemorrhage along the anterior left frontal lobe, adjacent to meningioma. A small mixed density left subdural collection is also questioned overlying the anterior left frontal lobe, measuring 5 mm in thickness (series 3, image 14). Additional scattered supratentorial small volume acute subarachnoid hemorrhage, most notably along the lateral right temporal lobe (series 3, image 13). Abnormal cortical/subcortical hypodensity throughout much of the left PCA vascular territory within the left temporal and occipital lobes consistent with infarct, likely early subacute. Associated curvilinear hyperdensity at this site may reflect petechial hemorrhage or additional small volume acute subarachnoid hemorrhage. Redemonstrated small chronic focus of encephalomalacia within the anterolateral right frontal lobe. No midline shift. Ill-defined hypoattenuation within the cerebral white matter is nonspecific, but consistent with chronic small vessel ischemic disease. Moderate generalized parenchymal atrophy. Calcifications redemonstrated within the basal ganglia and cerebellar white matter. Vascular: No definite hyperdense vessel. Atherosclerotic calcifications. Skull: Normal. Negative for fracture or focal lesion. Other: Right mastoid effusion. Redemonstrated left suboccipital scalp lesion measuring 2.4 cm, likely benign CT MAXILLOFACIAL FINDINGS Osseous: Non-displaced fracture of the left nasal bone/frontal process of maxilla. No other maxillofacial fracture is identified. Orbits: No acute abnormality. Sinuses: Mild ethmoid sinus mucosal thickening. Soft tissues: Swelling of the left frontotemporal scalp, forehead and left maxillofacial soft tissues. CT CERVICAL SPINE FINDINGS Alignment: Reversal of the expected cervical lordosis. No significant spondylolisthesis. Skull base and vertebrae: The basion-dental and atlanto-dental intervals are  maintained.No evidence of acute fracture to the cervical spine. Soft tissues and spinal canal: No prevertebral fluid or swelling. No visible canal hematoma. Atherosclerotic disease. Disc levels: Congenital nonunion of the posterior arch of C1. Redemonstrated severe cervical spondylosis with multilevel disc degeneration, posterior disc osteophytes, uncovertebral and facet hypertrophy. Redemonstrated degenerative fusion across the C5-C6 disc space and of the right sided articular pillars at this level. There may also be fusion across the C6-C7 disc space. Degenerative fusion of the C7 and T1 articular pillars on the right. Upper chest: Imaged lung apices clear.  No visible pneumothorax. These results were called by telephone at the time of interpretation on 07/15/2019 at 2:03 pm to provider [facility] , who verbally acknowledged these results. IMPRESSION: CT head: 1. A partially calcified left frontal meningioma has increased in size since head CT 01/09/2015, now 2.1 cm. 2. Small volume acute subarachnoid hemorrhage along the anterior left  frontal lobe, adjacent to the meningioma. Additional scattered small volume acute supratentorial subarachnoid hemorrhage, greatest along the lateral right temporal lobe. 3. A small mixed density subdural hematoma is also questioned overlying the anterior left frontal lobe, 5 mm in thickness. 4. Infarction change throughout much of the left PCA vascular territory, likely early subacute. Associated gyriform hyperdensity at this site may reflect petechial hemorrhage or additional small volume acute subarachnoid hemorrhage. 5. Chronic encephalomalacia within the anterolateral right frontal lobe. 6. Generalized parenchymal atrophy and chronic small vessel ischemic disease. 7. Right mastoid effusion. CT maxillofacial: 1. Nondisplaced fracture of the left nasal bone/frontal process of maxilla. 2. Left frontotemporal scalp, forehead and left maxillofacial soft tissue swelling. 3. Mild  ethmoid sinus mucosal thickening. CT cervical spine: 1. No evidence of acute fracture to the cervical spine. 2. Redemonstrated advanced cervical spondylosis with multiple sites of degenerative fusion, as detailed. Electronically Signed   By: Kellie Simmering DO   On: 07/15/2019 14:14     Subjective:   Patient was seen and examined 07/18/2019, 11:09 AM Patient stable today. No acute distress.  No issues overnight Stable for discharge.  Discharge Exam:    Vitals:   07/17/19 1933 07/17/19 2346 07/18/19 0337 07/18/19 0742  BP: (!) 168/46 (!) 167/65 (!) 180/67 118/77  Pulse: 67 62 64 67  Resp: 17 17 17 20   Temp: 98.2 F (36.8 C) 98.2 F (36.8 C) 98 F (36.7 C) 97.9 F (36.6 C)  TempSrc: Oral Oral Oral Axillary  SpO2: 100% 100% 100% 100%    General: Pt lying comfortably in bed & appears in no obvious distress, nonverbal, not cooperative with exam.  Cardiovascular: S1 & S2 heard, RRR, S1/S2 +. No murmurs, rubs, gallops or clicks. No JVD or pedal edema. Respiratory: Clear to auscultation without wheezing, rhonchi or crackles. No increased work of breathing. Abdominal:  Non-distended, non-tender & soft. No organomegaly or masses appreciated. Normal bowel sounds heard. CNS: Limited exam, patient is nonverbal, confused, contracted upper extremities Extremities: no edema, no cyanosis    The results of significant diagnostics from this hospitalization (including imaging, microbiology, ancillary and laboratory) are listed below for reference.      Microbiology:   Recent Results (from the past 240 hour(s))  SARS CORONAVIRUS 2 (TAT 6-24 HRS) Nasopharyngeal Nasopharyngeal Swab     Status: None   Collection Time: 07/15/19  8:36 PM   Specimen: Nasopharyngeal Swab  Result Value Ref Range Status   SARS Coronavirus 2 NEGATIVE NEGATIVE Final    Comment: (NOTE) SARS-CoV-2 target nucleic acids are NOT DETECTED. The SARS-CoV-2 RNA is generally detectable in upper and lower respiratory specimens  during the acute phase of infection. Negative results do not preclude SARS-CoV-2 infection, do not rule out co-infections with other pathogens, and should not be used as the sole basis for treatment or other patient management decisions. Negative results must be combined with clinical observations, patient history, and epidemiological information. The expected result is Negative. Fact Sheet for Patients: SugarRoll.be Fact Sheet for Healthcare Providers: https://www.woods-mathews.com/ This test is not yet approved or cleared by the [place] FDA and  has been authorized for detection and/or diagnosis of SARS-CoV-2 by FDA under an Emergency Use Authorization (EUA). This EUA will remain  in effect (meaning this test can be used) for the duration of the COVID-19 declaration under Section 56 4(b)(1) of the Act, 21 U.S.C. section 360bbb-3(b)(1), unless the authorization is terminated or revoked sooner. Performed at [facility], [place] [address] [place], [place]  [id number]   MRSA PCR Screening     Status: None   Collection Time: 07/15/19 10:13 PM   Specimen: Nasal Mucosa; Nasopharyngeal  Result Value Ref Range Status   MRSA by PCR NEGATIVE NEGATIVE Final    Comment:        The GeneXpert MRSA Assay (FDA approved for NASAL specimens only), is one component of a comprehensive MRSA colonization surveillance program. It is not intended to diagnose MRSA infection nor to guide or monitor treatment for MRSA infections. Performed at [facility], [place] [address] [place], [place] [address]      Labs:   CBC: Recent Labs  Lab 07/15/19 1250 07/15/19 1335  WBC  --  10.2  NEUTROABS  --  8.0*  HGB 15.3* 13.8  HCT 45.0 43.5  MCV  --  96.2  PLT  --  99991111   Basic Metabolic Panel: Recent Labs  Lab 07/15/19 1250 07/15/19 1335  NA 143 142  K 5.4* 4.3  CL 107 108  CO2  --  24  GLUCOSE 124* 106*  BUN 21 12  CREATININE 0.70 0.68    CALCIUM  --  9.2  MG  --  2.0   Liver Function Tests: Recent Labs  Lab 07/15/19 1335  AST 30  ALT 10  ALKPHOS 51  BILITOT 0.8  PROT 7.2  ALBUMIN 3.3*   BNP (last 3 results) No results for input(s): BNP in the last 8760 hours. Cardiac Enzymes: No results for input(s): CKTOTAL, CKMB, CKMBINDEX, TROPONINI in the last 168 hours. CBG: Recent Labs  Lab 07/15/19 1230  GLUCAP 111*   Hgb A1c Recent Labs    07/16/19 0253  HGBA1C 5.5   Lipid Profile Recent Labs    07/16/19 0253  CHOL 197  HDL 43  LDLCALC 147*  TRIG 34  CHOLHDL 4.6   Thyroid function studies No results for input(s): TSH, T4TOTAL, T3FREE, THYROIDAB in the last 72 hours.  Invalid input(s): FREET3 Anemia work up No results for input(s): VITAMINB12, FOLATE, FERRITIN, TIBC, IRON, RETICCTPCT in the last 72 hours. Urinalysis    Component Value Date/Time   COLORURINE YELLOW 07/15/2019 2147   APPEARANCEUR CLOUDY (A) 07/15/2019 2147   LABSPEC 1.015 07/15/2019 2147   PHURINE 6.0 07/15/2019 2147   GLUCOSEU NEGATIVE 07/15/2019 2147   HGBUR SMALL (A) 07/15/2019 2147   BILIRUBINUR NEGATIVE 07/15/2019 2147   KETONESUR 20 (A) 07/15/2019 2147   PROTEINUR 30 (A) 07/15/2019 2147   UROBILINOGEN 0.2 01/09/2015 1150   NITRITE NEGATIVE 07/15/2019 2147   LEUKOCYTESUR NEGATIVE 07/15/2019 2147         Time coordinating discharge: Over 45 minutes  SIGNED: Deatra James, MD, FACP, [facility]. [facility],  Pager [phone]  If 7PM-7AM, please contact night-coverage [email or web address], Password [facility] 07/18/2019, 11:09 AM

## 2019-07-18 NOTE — Progress Notes (Signed)
Manufacturing engineer - Hospice  Received request from [place] for patient/family interest in hospice at [facility]. Chart reviewed and eligibility confirmed. [facility] manager Kathlee Nations aware.   Spoke with patient's son to explain services and answer questions. Understand from [facility] is requesting hospital bed to be delivered prior to discharge. Hospital bed has been ordered. Son is aware.   [facility] referral specialist will contact patient's son to arrange first visit.   Please send with patient signed and completed DNR. Please make arrangements for patient to have scripts for any medication she does not already have.   Please do not hesitate to call with hospice related questions.   Thank you,  Erling Conte, LCSW [phone]

## 2019-07-18 NOTE — TOC Initial Note (Signed)
Transition of Care [facility]) - Initial/Assessment Note    Patient Details  Name: Amber Fowler MRN: [id number] Date of Birth: February 02, 1919  Transition of Care [facility] CM/SW Contact:    [name], LCSW Phone Number: 07/18/2019, 11:40 AM  Clinical Narrative:   CSW reached out to [facility] to discuss patient returning with hospice care, to ensure that they would be able to provide level of care needed for patient. CSW spoke with Dottie at [facility] and discussed patient's care needs, they will be able to take the patient back to provide needed care. Dottie indicated that the patient will need a hospital bed as she just has a regular bed at this time. CSW spoke with patient's son to update him on current discharge plan, and patient's son agreeable to providing hospice referral to [facility]. CSW answered son's questions about the addition of hospice care and what benefits that will have for the patient's care. CSW spoke with Harmon Pier with [facility] to provide referral for hospice services. Harmon Pier to ensure that patient is eligible and follow up with son and [facility] about setting up services, and will update CSW. CSW to follow.     Expected Discharge Plan: Assisted Living Barriers to Discharge: Equipment Delay   Patient Goals and CMS Choice Patient states their goals for this hospitalization and ongoing recovery are:: patient unable to state goals, son would like to keep her comfortable CMS Medicare.gov Compare Post Acute Care list provided to:: Patient Represenative (must comment) Choice offered to / list presented to : Adult Children  Expected Discharge Plan and Services Expected Discharge Plan: Assisted Living     Post Acute Care Choice: Hospice Living arrangements for the past 2 months: [place] Expected Discharge Date: 07/18/19                                    Prior Living Arrangements/Services Living arrangements for the past 2 months: [place] Lives with:: Facility Resident Patient language and need for interpreter reviewed:: No Do you feel safe going back to the place where you live?: Yes      Need for Family Participation in Patient Care: Yes (Comment) Care giver support system in place?: Yes (comment) Current home services: [facility] Criminal Activity/Legal Involvement Pertinent to Current Situation/Hospitalization: No - Comment as needed  Activities of Daily Living Home Assistive Devices/Equipment: Wheelchair, Bedside commode/3-in-1, Other (Comment) ADL Screening (condition at time of admission) Patient's cognitive ability adequate to safely complete daily activities?: No Is the patient deaf or have difficulty hearing?: Yes Does the patient have difficulty seeing, even when wearing glasses/contacts?: Yes Does the patient have difficulty concentrating, remembering, or making decisions?: Yes(dementia) Patient able to express need for assistance with ADLs?: No Does the patient have difficulty dressing or bathing?: Yes Independently performs ADLs?: No Communication: Needs assistance Is this a change from baseline?: Pre-admission baseline Dressing (OT): Needs assistance Is this a change from baseline?: Pre-admission baseline Grooming: Needs assistance Is this a change from baseline?: Pre-admission baseline Feeding: Needs assistance Is this a change from baseline?: Pre-admission baseline Bathing: Needs assistance Is this a change from baseline?: Pre-admission baseline Toileting: Needs assistance In/Out Bed: Needs assistance Walks in Home: Needs assistance(wheel chair) Is this a change from baseline?: Pre-admission baseline Does the patient have difficulty walking or climbing stairs?: Yes Weakness of Legs: Both Weakness of Arms/Hands: Both  Permission Sought/Granted Permission sought to share information  with : Customer service manager, [employer] Permission granted to share information with :  Yes, Verbal Permission Granted  Share Information with NAME: Dr. Jeanann Lewandowsky  Permission granted to share info w AGENCY: [facility], [place] granted to share info w Relationship: Son     Emotional Assessment   Attitude/Demeanor/Rapport: Unable to Assess Affect (typically observed): Unable to Assess   Alcohol / Substance Use: Not Applicable Psych Involvement: No (comment)  Admission diagnosis:  Subarachnoid hemorrhage (HCC) [I60.9] Closed fracture of nasal bone, initial encounter [S02.2XXA] Cerebrovascular accident (CVA), unspecified mechanism ([place]) [I63.9] Closed fracture of right scapula, unspecified part of scapula, initial encounter [S42.101A] AMS (altered mental status) [R41.82] Patient Active Problem List   Diagnosis Date Noted  . SAH (subarachnoid hemorrhage) ([place]) 07/15/2019  . Subdural hematoma due to concussion ([place]) 07/15/2019  . Stroke (cerebrum) ([place]) 07/15/2019  . AMS (altered mental status) 07/15/2019  . Palliative care encounter 12/09/2017  . Closed nondisplaced fracture of coronoid process of right ulna 08/21/2016  . Humerus fracture   . Acute blood loss anemia   . Hip fracture requiring operative repair ([place]) 01/10/2015  . Hypertension 01/09/2015  . Hypothyroid 01/09/2015  . Alzheimer disease ([place]) 01/09/2015  . Left displaced femoral neck fracture ([place]) 01/09/2015   PCP:  Alanson Puls, [place]:   [place], [place] to [address]istered [place] [address] Phone: [phone] Fax: [phone]     Social Determinants of Health (SDOH) Interventions    Readmission Risk Interventions No flowsheet data found.

## 2020-03-28 DEATH — deceased

## 2021-05-28 IMAGING — CT CT MAXILLOFACIAL W/O CM
3 series · 13 of 47 positions shown, 15 images · non-contrast
Comparison: CT head and cervical spine 01/09/2015

CLINICAL DATA: Seizure, nontraumatic, facial trauma.

EXAM:
CT HEAD WITHOUT CONTRAST
CT MAXILLOFACIAL WITHOUT CONTRAST
CT CERVICAL SPINE WITHOUT CONTRAST
CT ABDOMEN AND PELVIS WITHOUT CONTRAST
TECHNIQUE: Contiguous axial images were obtained from the base of the skull
through the vertex without intravenous contrast. Multidetector CT
imaging of the maxillofacial structures was performed. Multiplanar
CT image reconstructions were also generated. A small metallic BB
was placed on the right temple in order to reliably differentiate
right from left. Multidetector CT imaging of the cervical spine was
performed without intravenous contrast. Multiplanar CT image
reconstructions were also generated.

[Series 3: facialbone 2.0 st · axial · 0.33mm/px · z∈[-195,-59]mm · 7 of 83 slices shown, 9 images]
[im 9/83  brain]
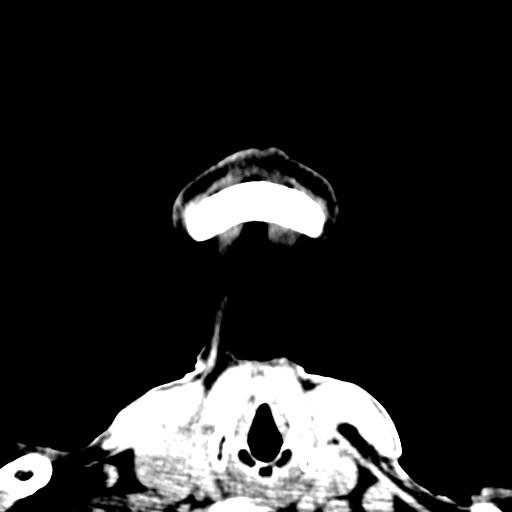
[im 9/83  bone]
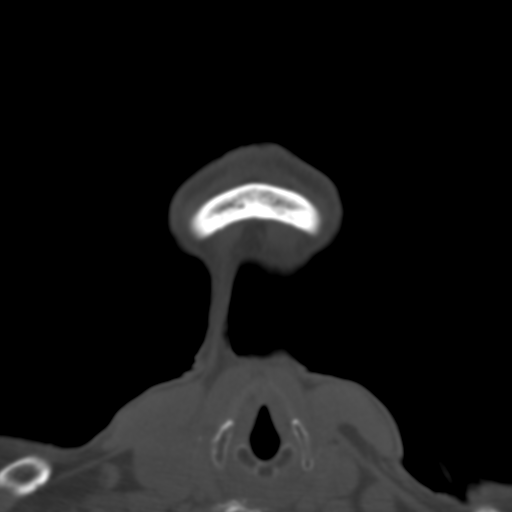
[im 20/83  bone]
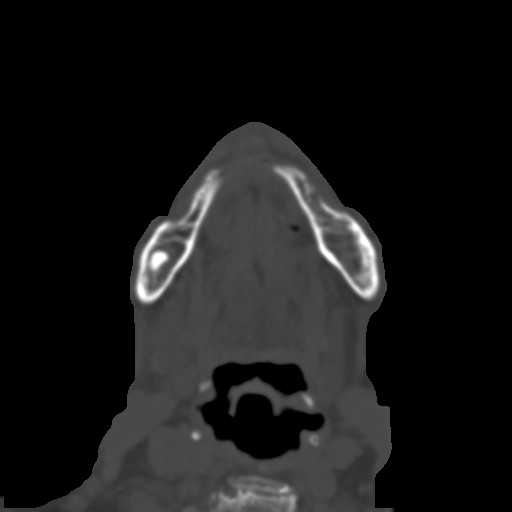
[im 32/83  bone]
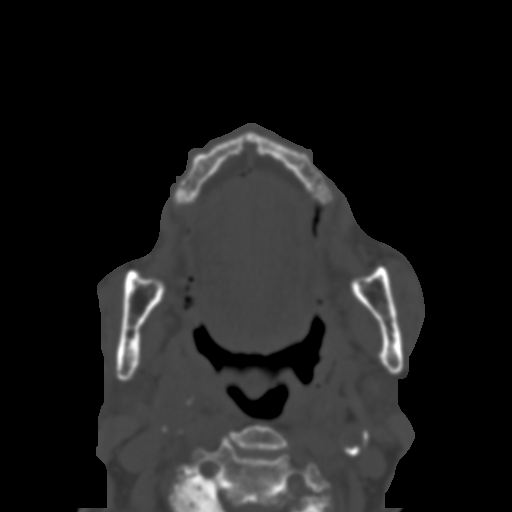
[im 43/83  bone]
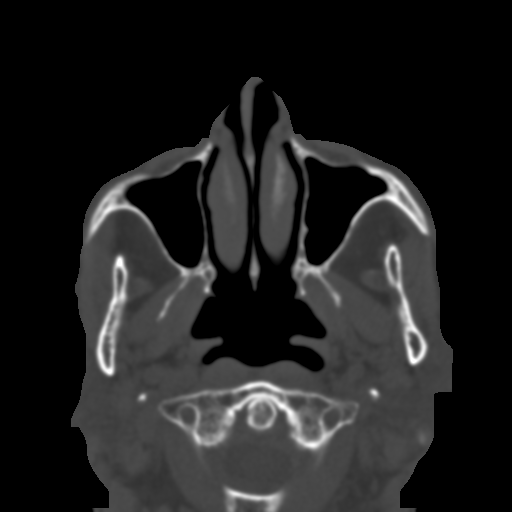
[im 54/83  brain]
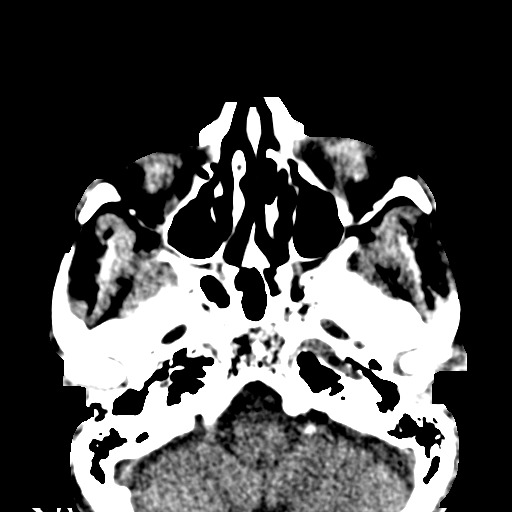
[im 54/83  bone]
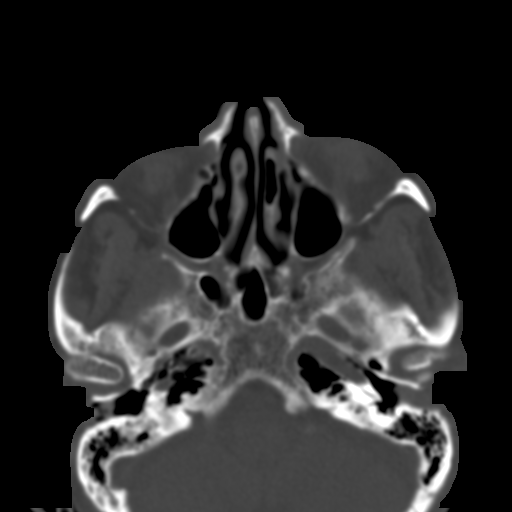
[im 66/83  bone]
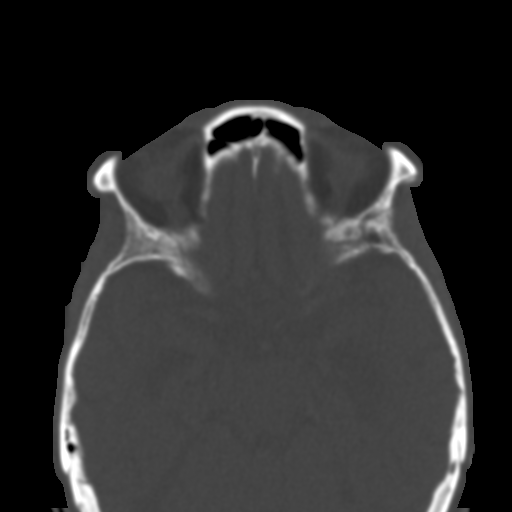
[im 77/83  bone]
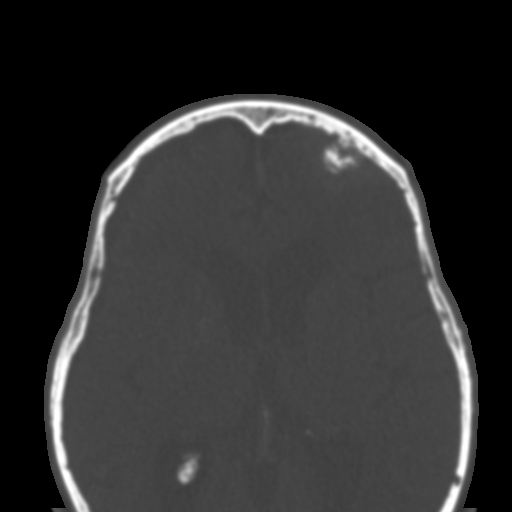

[Series 9: facialbone 2.0 cor st · coronal · 0.34mm/px · 3 of 79 slices shown]
[im 27/79  bone]
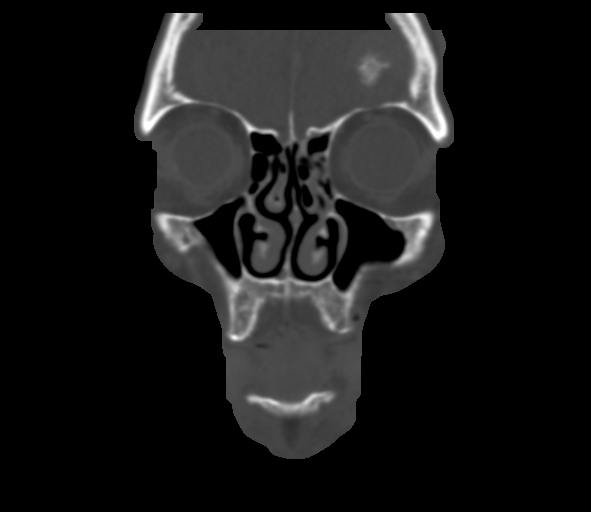
[im 35/79  bone]
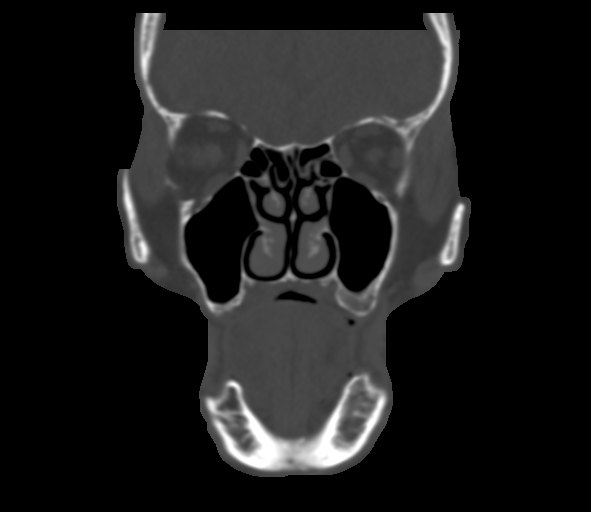
[im 44/79  bone]
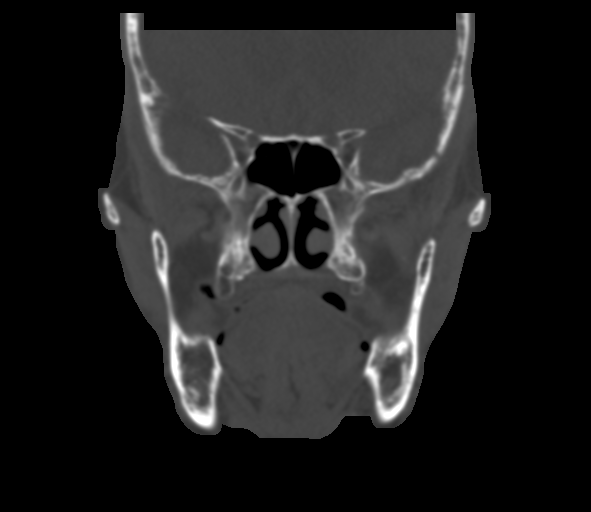

[Series 10: facialbone 2.0 sag st · sagittal · 0.36mm/px · 3 of 97 slices shown]
[im 33/97  bone]
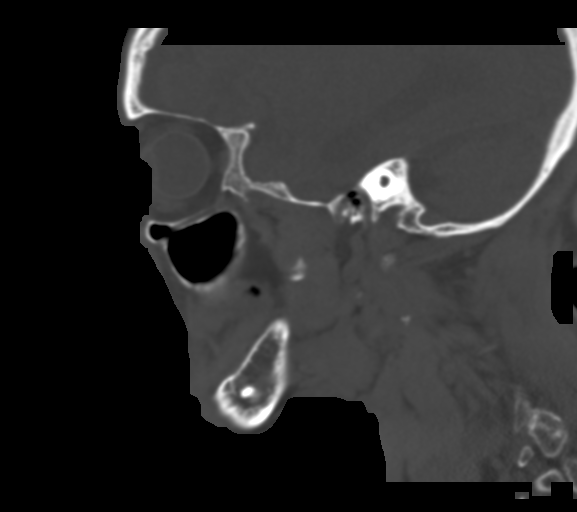
[im 49/97  bone]
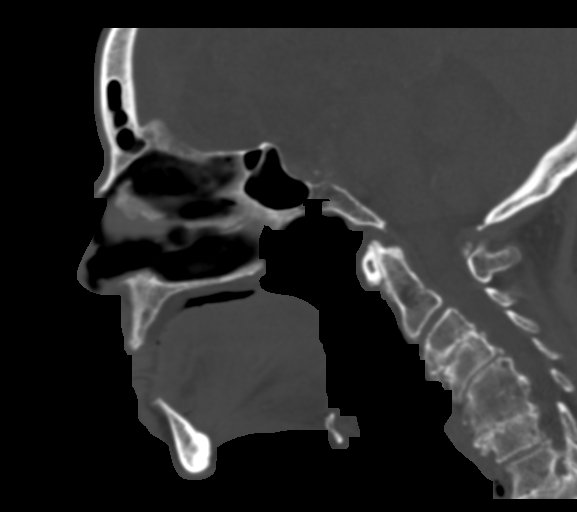
[im 65/97  bone]
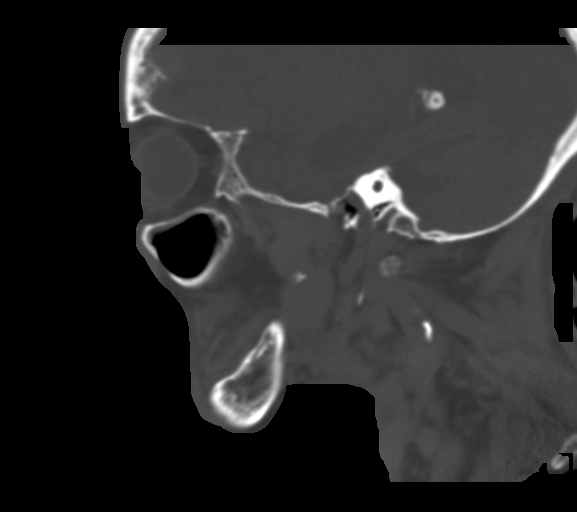

[13 of 47 positions shown; findings below may reference images not displayed]

FINDINGS: CT HEAD FINDINGS

Brain:

Since prior head CT 01/09/2015 there has been interval increase in
size of a partially calcified meningioma with both hyperdense soft
tissue components and calcified components. This now measures 2.1 x
1.6 x 2.4 cm (previously 1.5 cm).

There is small volume acute subarachnoid hemorrhage along the
anterior left frontal lobe, adjacent to meningioma. A small mixed
density left subdural collection is also questioned overlying the
anterior left frontal lobe, measuring 5 mm in thickness (series 3,
image 14). Additional scattered supratentorial small volume acute
subarachnoid hemorrhage, most notably along the lateral right
temporal lobe (series 3, image 13).

Abnormal cortical/subcortical hypodensity throughout much of the
left PCA vascular territory within the left temporal and occipital
lobes consistent with infarct, likely early subacute. Associated
curvilinear hyperdensity at this site may reflect petechial
hemorrhage or additional small volume acute subarachnoid hemorrhage.

Redemonstrated small chronic focus of encephalomalacia within the
anterolateral right frontal lobe. No midline shift. Ill-defined
hypoattenuation within the cerebral white matter is nonspecific, but
consistent with chronic small vessel ischemic disease. Moderate
generalized parenchymal atrophy. Calcifications redemonstrated
within the basal ganglia and cerebellar white matter.

Vascular: No definite hyperdense vessel. Atherosclerotic
calcifications.

Skull: Normal. Negative for fracture or focal lesion.

Other: Right mastoid effusion. Redemonstrated left suboccipital
scalp lesion measuring 2.4 cm, likely benign

CT MAXILLOFACIAL FINDINGS

Osseous: Non-displaced fracture of the left nasal bone/frontal
process of maxilla. No other maxillofacial fracture is identified.

Orbits: No acute abnormality.

Sinuses: Mild ethmoid sinus mucosal thickening.

Soft tissues: Swelling of the left frontotemporal scalp, forehead
and left maxillofacial soft tissues.

CT CERVICAL SPINE FINDINGS

Alignment: Reversal of the expected cervical lordosis. No
significant spondylolisthesis.

Skull base and vertebrae: The basion-dental and atlanto-dental
intervals are maintained.No evidence of acute fracture to the
cervical spine.

Soft tissues and spinal canal: No prevertebral fluid or swelling. No
visible canal hematoma. Atherosclerotic disease.

Disc levels: Congenital nonunion of the posterior arch of C1.
Redemonstrated severe cervical spondylosis with multilevel disc
degeneration, posterior disc osteophytes, uncovertebral and facet
hypertrophy. Redemonstrated degenerative fusion across the C5-C6
disc space and of the right sided articular pillars at this level.
There may also be fusion across the C6-C7 disc space. Degenerative
fusion of the C7 and T1 articular pillars on the right.

Upper chest: Imaged lung apices clear.  No visible pneumothorax.

These results were called by telephone at the time of interpretation
on 07/15/2019 at [DATE] to provider NGHIDIWA BONIFASIUS , who verbally
acknowledged these results.
IMPRESSION: CT head:

1. A partially calcified left frontal meningioma has increased in
size since head CT 01/09/2015, now 2.1 cm.
2. Small volume acute subarachnoid hemorrhage along the anterior
left frontal lobe, adjacent to the meningioma. Additional scattered
small volume acute supratentorial subarachnoid hemorrhage, greatest
along the lateral right temporal lobe.
3. A small mixed density subdural hematoma is also questioned
overlying the anterior left frontal lobe, 5 mm in thickness.
4. Infarction change throughout much of the left PCA vascular
territory, likely early subacute. Associated gyriform hyperdensity
at this site may reflect petechial hemorrhage or additional small
volume acute subarachnoid hemorrhage.
5. Chronic encephalomalacia within the anterolateral right frontal
lobe.
6. Generalized parenchymal atrophy and chronic small vessel ischemic
disease.
7. Right mastoid effusion.

CT maxillofacial:

1. Nondisplaced fracture of the left nasal bone/frontal process of
maxilla.
2. Left frontotemporal scalp, forehead and left maxillofacial soft
tissue swelling.
3. Mild ethmoid sinus mucosal thickening.

CT cervical spine:

1. No evidence of acute fracture to the cervical spine.
2. Redemonstrated advanced cervical spondylosis with multiple sites
of degenerative fusion, as detailed.
